# Patient Record
Sex: Female | Born: 1990 | Race: Black or African American | Hispanic: No | Marital: Single | Smoking: Never smoker
Health system: Southern US, Community
[De-identification: ages and names within clinical notes are randomized; demographics above are authoritative.]

## PROBLEM LIST (undated history)

## (undated) ENCOUNTER — Inpatient Hospital Stay (HOSPITAL_COMMUNITY): Payer: Self-pay

## (undated) DIAGNOSIS — F419 Anxiety disorder, unspecified: Secondary | ICD-10-CM

## (undated) DIAGNOSIS — K219 Gastro-esophageal reflux disease without esophagitis: Secondary | ICD-10-CM

## (undated) DIAGNOSIS — A549 Gonococcal infection, unspecified: Secondary | ICD-10-CM

## (undated) HISTORY — PX: WISDOM TOOTH EXTRACTION: SHX21

## (undated) HISTORY — PX: NO PAST SURGERIES: SHX2092

---

## 2011-10-26 NOTE — L&D Delivery Note (Signed)
Delivery Note At 5:26 PM a viable female was delivered via Vaginal, Spontaneous Delivery (Presentation: Left Occiput Anterior).  NICU team present for delivery due to thick MSF, but infant cried w/out stimulation before delivery of body. Deep suctioning not needed per Dr. Katrinka Blazing, neonatologist. Infant bulb suctioned by CNM.APGAR: 9, 9; weight Pending.   Placenta status: Intact, Spontaneous.  Cord: 3 vessels with the following complications: Shoulder cord.  Cord pH: NA  Anesthesia: Epidural  Episiotomy: None Lacerations: left Labial Suture Repair: 3.0 vicryl rapide Est. Blood Loss (mL): 450  Mom to AICU.  Baby to nursery-stable. Placenta to: BS Feeding: Bottle Circ: OP Contraception: Undecided  P:C Ratio resulted ~15 minutes before delivery. Severely elevated at 3.34. Cath specimen. Magnesium Sulfate ordered immediately after delivery and started as soon as avail from pharmacy. Will infuse x 24 hours. Labetalol IV PRN ordered for >/= 160 SBP and/or >/=105 DBP after Mag bolus completed.   Dorathy Kinsman 08/22/2012, 6:31 PM

## 2012-01-15 ENCOUNTER — Encounter (HOSPITAL_COMMUNITY): Payer: Self-pay

## 2012-01-15 ENCOUNTER — Emergency Department (HOSPITAL_COMMUNITY)
Admission: EM | Admit: 2012-01-15 | Discharge: 2012-01-15 | Disposition: A | Discharge status: Home / Self Care | Payer: Medicaid Other | Source: Home / Self Care | Attending: Emergency Medicine | Admitting: Emergency Medicine

## 2012-01-15 DIAGNOSIS — Z331 Pregnant state, incidental: Secondary | ICD-10-CM

## 2012-01-15 DIAGNOSIS — R350 Frequency of micturition: Secondary | ICD-10-CM

## 2012-01-15 HISTORY — DX: Anxiety disorder, unspecified: F41.9

## 2012-01-15 HISTORY — DX: Gastro-esophageal reflux disease without esophagitis: K21.9

## 2012-01-15 LAB — POCT URINALYSIS DIP (DEVICE)
Glucose, UA: NEGATIVE mg/dL
Leukocytes, UA: NEGATIVE
Nitrite: NEGATIVE

## 2012-01-15 LAB — POCT PREGNANCY, URINE: Preg Test, Ur: POSITIVE — AB

## 2012-01-15 NOTE — Discharge Instructions (Signed)
As discussed today if you've decided to continue with this pregnancy you should establish prenatal care as soon as possible see referrals below. Today during your interview denied any pelvic pain or any vaginal bleeding if you were to experience any pelvic pain, vaginal bleeding, should [facility] as further evaluation will be needed.     ABCs of Pregnancy A Antepartum care is very important. Be sure you see your doctor and get prenatal care as soon as you think you are pregnant. At this time, you will be tested for infection, genetic abnormalities and potential problems with you and the pregnancy. This is the time to discuss diet, exercise, work, medications, labor, pain medication during labor and the possibility of a cesarean delivery. Ask any questions that may concern you. It is important to see your doctor regularly throughout your pregnancy. Avoid exposure to toxic substances and chemicals - such as cleaning solvents, lead and mercury, some insecticides, and paint. Pregnant women should avoid exposure to paint fumes, and fumes that cause you to feel ill, dizzy or faint. When possible, it is a good idea to have a pre-pregnancy consultation with your caregiver to begin some important recommendations your caregiver suggests such as, taking folic acid, exercising, quitting smoking, avoiding alcoholic beverages, etc. B Breastfeeding is the healthiest choice for both you and your baby. It has many nutritional benefits for the baby and health benefits for the mother. It also creates a very tight and loving bond between the baby and mother. Talk to your doctor, your family and friends, and your employer about how you choose to feed your baby and how they can support you in your decision. Not all birth defects can be prevented, but a woman can take actions that may increase her chance of having a healthy baby. Many birth defects happen very early in pregnancy, sometimes before a  woman even knows she is pregnant. Birth defects or abnormalities of any child in your or the father's family should be discussed with your caregiver. Get a good support bra as your breast size changes. Wear it especially when you exercise and when nursing.  C Celebrate the news of your pregnancy with the your spouse/father and family. Childbirth classes are helpful to take for you and the spouse/father because it helps to understand what happens during the pregnancy, labor and delivery. Cesarean delivery should be discussed with your doctor so you are prepared for that possibility. The pros and cons of circumcision if it is a boy, should be discussed with your pediatrician. Cigarette smoking during pregnancy can result in low birth weight babies. It has been associated with infertility, miscarriages, tubal pregnancies, infant death (mortality) and poor health (morbidity) in childhood. Additionally, cigarette smoking may cause long-term learning disabilities. If you smoke, you should try to quit before getting pregnant and not smoke during the pregnancy. Secondary smoke may also harm a mother and her developing baby. It is a good idea to ask people to stop smoking around you during your pregnancy and after the baby is born. Extra calcium is necessary when you are pregnant and is found in your prenatal vitamin, in dairy products, green leafy vegetables and in calcium supplements. D A healthy diet according to your current weight and height, along with vitamins and mineral supplements should be discussed with your caregiver. Domestic abuse or violence should be made known to your doctor right away to get the situation corrected. Drink more water when you exercise to keep hydrated. Discomfort  of your back and legs usually develops and progresses from the middle of the second trimester through to delivery of the baby. This is because of the enlarging baby and uterus, which may also affect your balance. Do not take  illegal drugs. Illegal drugs can seriously harm the baby and you. Drink extra fluids (water is best) throughout pregnancy to help your body keep up with the increases in your blood volume. Drink at least 6 to 8 glasses of water, fruit juice, or milk each day. A good way to know you are drinking enough fluid is when your urine looks almost like clear water or is very light yellow.  E Eat healthy to get the nutrients you and your unborn baby need. Your meals should include the five basic food groups. Exercise (30 minutes of light to moderate exercise a day) is important and encouraged during pregnancy, if there are no medical problems or problems with the pregnancy. Exercise that causes discomfort or dizziness should be stopped and reported to your caregiver. Emotions during pregnancy can change from being ecstatic to depression and should be understood by you, your partner and your family. F Fetal screening with ultrasound, amniocentesis and monitoring during pregnancy and labor is common and sometimes necessary. Take 400 micrograms of folic acid daily both before, when possible, and during the first few months of pregnancy to reduce the risk of birth defects of the brain and spine. All women who could possibly become pregnant should take a vitamin with folic acid, every day. It is also important to eat a healthy diet with fortified foods (enriched grain products, including cereals, rice, breads, and pastas) and foods with natural sources of folate (orange juice, green leafy vegetables, beans, peanuts, broccoli, asparagus, peas, and lentils). The father should be involved with all aspects of the pregnancy including, the prenatal care, childbirth classes, labor, delivery, and postpartum time. Fathers may also have emotional concerns about being a father, financial needs, and raising a family. G Genetic testing should be done appropriately. It is important to know your family and the father's history. If there  have been problems with pregnancies or birth defects in your family, report these to your doctor. Also, genetic counselors can talk with you about the information you might need in making decisions about having a family. You can call a major medical center in your area for help in finding a board-certified genetic counselor. Genetic testing and counseling should be done before pregnancy when possible, especially if there is a history of problems in the mother's or father's family. Certain ethnic backgrounds are more at risk for genetic defects. H Get familiar with the hospital where you will be having your baby. Get to know how long it takes to get there, the labor and delivery area, and the hospital procedures. Be sure your medical insurance is accepted there. Get your home ready for the baby including, clothes, the baby's room (when possible), furniture and car seat. Hand washing is important throughout the day, especially after handling raw meat and poultry, changing the baby's diaper or using the bathroom. This can help prevent the spread of many bacteria and viruses that cause infection. Your hair may become dry and thinner, but will return to normal a few weeks after the baby is born. Heartburn is a common problem that can be treated by taking antacids recommended by your caregiver, eating smaller meals 5 or 6 times a day, not drinking liquids when eating, drinking between meals and raising the head  of your bed 2 to 3 inches. I Insurance to cover you, the baby, doctor and hospital should be reviewed so that you will be prepared to pay any costs not covered by your insurance plan. If you do not have medical insurance, there are usually clinics and services available for you in your community. Take 30 milligrams of iron during your pregnancy as prescribed by your doctor to reduce the risk of low red blood cells (anemia) later in pregnancy. All women of childbearing age should eat a diet rich in  iron. J There should be a joint effort for the mother, father and any other children to adapt to the pregnancy financially, emotionally, and psychologically during the pregnancy. Join a support group for moms-to-be. Or, join a class on parenting or childbirth. Have the family participate when possible. K Know your limits. Let your caregiver know if you experience any of the following:   Pain of any kind.   Strong cramps.   You develop a lot of weight in a short period of time (5 pounds in 3 to 5 days).   Vaginal bleeding, leaking of amniotic fluid.   Headache, vision problems.   Dizziness, fainting, shortness of breath.   Chest pain.   Fever of 102 F (38.9 C) or higher.   Gush of clear fluid from your vagina.   Painful urination.   Domestic violence.   Irregular heartbeat (palpitations).   Rapid beating of the heart (tachycardia).   Constant feeling sick to your stomach (nauseous) and vomiting.   Trouble walking, fluid retention (edema).   Muscle weakness.   If your baby has decreased activity.   Persistent diarrhea.   Abnormal vaginal discharge.   Uterine contractions at 20-minute intervals.   Back pain that travels down your leg.  L Learn and practice that what you eat and drink should be in moderation and healthy for you and your baby. Legal drugs such as alcohol and caffeine are important issues for pregnant women. There is no safe amount of alcohol a woman can drink while pregnant. Fetal alcohol syndrome, a disorder characterized by growth retardation, facial abnormalities, and central nervous system dysfunction, is caused by a woman's use of alcohol during pregnancy. Caffeine, found in tea, coffee, soft drinks and chocolate, should also be limited. Be sure to read labels when trying to cut down on caffeine during pregnancy. More than 200 foods, beverages, and over-the-counter medications contain caffeine and have a high salt content! There are coffees and teas  that do not contain caffeine. M Medical conditions such as diabetes, epilepsy, and high blood pressure should be treated and kept under control before pregnancy when possible, but especially during pregnancy. Ask your caregiver about any medications that may need to be changed or adjusted during pregnancy. If you are currently taking any medications, ask your caregiver if it is safe to take them while you are pregnant or before getting pregnant when possible. Also, be sure to discuss any herbs or vitamins you are taking. They are medicines, too! Discuss with your doctor all medications, prescribed and over-the-counter, that you are taking. During your prenatal visit, discuss the medications your doctor may give you during labor and delivery. N Never be afraid to ask your doctor or caregiver questions about your health, the progress of the pregnancy, family problems, stressful situations, and recommendation for a pediatrician, if you do not have one. It is better to take all precautions and discuss any questions or concerns you may have during your  office visits. It is a good idea to write down your questions before you visit the doctor. O Over-the-counter cough and cold remedies may contain alcohol or other ingredients that should be avoided during pregnancy. Ask your caregiver about prescription, herbs or over-the-counter medications that you are taking or may consider taking while pregnant.  P Physical activity during pregnancy can benefit both you and your baby by lessening discomfort and fatigue, providing a sense of well-being, and increasing the likelihood of early recovery after delivery. Light to moderate exercise during pregnancy strengthens the belly (abdominal) and back muscles. This helps improve posture. Practicing yoga, walking, swimming, and cycling on a stationary bicycle are usually safe exercises for pregnant women. Avoid scuba diving, exercise at high altitudes (over 3000 feet), skiing,  horseback riding, contact sports, etc. Always check with your doctor before beginning any kind of exercise, especially during pregnancy and especially if you did not exercise before getting pregnant. Q Queasiness, stomach upset and morning sickness are common during pregnancy. Eating a couple of crackers or dry toast before getting out of bed. Foods that you normally love may make you feel sick to your stomach. You may need to substitute other nutritious foods. Eating 5 or 6 small meals a day instead of 3 large ones may make you feel better. Do not drink with your meals, drink between meals. Questions that you have should be written down and asked during your prenatal visits. R Read about and make plans to baby-proof your home. There are important tips for making your home a safer environment for your baby. Review the tips and make your home safer for you and your baby. Read food labels regarding calories, salt and fat content in the food. S Saunas, hot tubs, and steam rooms should be avoided while you are pregnant. Excessive high heat may be harmful during your pregnancy. Your caregiver will screen and examine you for sexually transmitted diseases and genetic disorders during your prenatal visits. Learn the signs of labor. Sexual relations while pregnant is safe unless there is a medical or pregnancy problem and your caregiver advises against it. T Traveling long distances should be avoided especially in the third trimester of your pregnancy. If you do have to travel out of state, be sure to take a copy of your medical records and medical insurance plan with you. You should not travel long distances without seeing your doctor first. Most airlines will not allow you to travel after 36 weeks of pregnancy. Toxoplasmosis is an infection caused by a parasite that can seriously harm an unborn baby. Avoid eating undercooked meat and handling cat litter. Be sure to wear gloves when gardening. Tingling of the hands  and fingers is not unusual and is due to fluid retention. This will go away after the baby is born. U Womb (uterus) size increases during the first trimester. Your kidneys will begin to function more efficiently. This may cause you to feel the need to urinate more often. You may also leak urine when sneezing, coughing or laughing. This is due to the growing uterus pressing against your bladder, which lies directly in front of and slightly under the uterus during the first few months of pregnancy. If you experience burning along with frequency of urination or bloody urine, be sure to tell your doctor. The size of your uterus in the third trimester may cause a problem with your balance. It is advisable to maintain good posture and avoid wearing high heels during this time. An ultrasound  of your baby may be necessary during your pregnancy and is safe for you and your baby. V Vaccinations are an important concern for pregnant women. Get needed vaccines before pregnancy. Center for Disease Control ([employer]) has clear guidelines for the use of vaccines during pregnancy. Review the list, be sure to discuss it with your doctor. Prenatal vitamins are helpful and healthy for you and the baby. Do not take extra vitamins except what is recommended. Taking too much of certain vitamins can cause overdose problems. Continuous vomiting should be reported to your caregiver. Varicose veins may appear especially if there is a family history of varicose veins. They should subside after the delivery of the baby. Support hose helps if there is leg discomfort. W Being overweight or underweight during pregnancy may cause problems. Try to get within 15 pounds of your ideal weight before pregnancy. Remember, pregnancy is not a time to be dieting! Do not stop eating or start skipping meals as your weight increases. Both you and your baby need the calories and nutrition you receive from a healthy diet. Be sure to consult with your  doctor about your diet. There is a formula and diet plan available depending on whether you are overweight or underweight. Your caregiver or nutritionist can help and advise you if necessary. X Avoid X-rays. If you must have dental work or diagnostic tests, tell your dentist or physician that you are pregnant so that extra care can be taken. X-rays should only be taken when the risks of not taking them outweigh the risk of taking them. If needed, only the minimum amount of radiation should be used. When X-rays are necessary, protective lead shields should be used to cover areas of the body that are not being X-rayed. Y Your baby loves you. Breastfeeding your baby creates a loving and very close bond between the two of you. Give your baby a healthy environment to live in while you are pregnant. Infants and children require constant care and guidance. Their health and safety should be carefully watched at all times. After the baby is born, rest or take a nap when the baby is sleeping. Z Get your ZZZs. Be sure to get plenty of rest. Resting on your side as often as possible, especially on your left side is advised. It provides the best circulation to your baby and helps reduce swelling. Try taking a nap for 30 to 45 minutes in the afternoon when possible. After the baby is born rest or take a nap when the baby is sleeping. Try elevating your feet for that amount of time when possible. It helps the circulation in your legs and helps reduce swelling.  Most information courtesy of the CDC. Document Released: 10/11/2005 Document Revised: 09/30/2011 Document Reviewed: 06/25/2009 [facility] Patient Information 2012 [place], [place].

## 2012-01-15 NOTE — ED Provider Notes (Signed)
History     CSN: [id number]  Arrival date & time 01/15/12  1350   First MD Initiated Contact with Patient 01/15/12 1408      Chief Complaint  Patient presents with  . Urinary Frequency    (Consider location/radiation/quality/duration/timing/severity/associated sxs/prior treatment) HPI Comments: Patient presented to urgent care today complaining that during the last week she has noted that she's been urinating frequently. On further questioning patient denies any pain or vaginal bleeding. No nausea no vomiting no diarrheas.  Patient is specifically asked about abdominal pain she describes she has no pain. She also describes her last menstrual period has been in the first days of February usually regular with her periods. Patient denies having had any home test kit as she is sexually active and not using protection.  Patient denies any previous pregnancies, previous STDs.  Patient is a 21 y.o. female presenting with frequency. The history is provided by the patient.  Urinary Frequency This is a new problem. The current episode started more than 1 week ago. The problem occurs constantly. Pertinent negatives include no abdominal pain and no shortness of breath.    Past Medical History  Diagnosis Date  . Acid reflux   . Anxiety     History reviewed. No pertinent past surgical history.  History reviewed. No pertinent family history.  History  Substance Use Topics  . Smoking status: Never Smoker   . Smokeless tobacco: Not on file  . Alcohol Use: No    OB History    Grav Para Term Preterm Abortions TAB SAB Ect Mult Living                  Review of Systems  Constitutional: Positive for appetite change and fatigue. Negative for fever, diaphoresis and activity change.  Respiratory: Negative for shortness of breath.   Gastrointestinal: Negative for nausea, vomiting, abdominal pain, abdominal distention, anal bleeding and rectal pain.  Genitourinary: Positive for frequency.  Negative for dysuria, flank pain, vaginal bleeding, vaginal discharge, difficulty urinating, vaginal pain, menstrual problem and dyspareunia.       Amenorrhea    Allergies  Review of patient's allergies indicates no known allergies.  Home Medications   Current Outpatient Rx  Name Route Sig Dispense Refill  . OMEPRAZOLE 10 MG PO CPDR Oral Take 10 mg by mouth daily.      BP 101/62  Pulse 60  Temp(Src) 98.9 F (37.2 C) (Oral)  Resp 14  SpO2 100%  LMP 11/26/2011  Physical Exam  Constitutional: She appears well-developed and well-nourished. No distress.  HENT:  Head: Normocephalic.  Eyes: Conjunctivae are normal.  Abdominal: Soft. She exhibits no distension and no mass. There is no tenderness. There is no rebound and no guarding.       As noted on exam given her initial presenting symptom as depicted in nursing notes patient described having pelvic pain 9/10. During my interview and exam patient denies any pain. N. no pelvic discomfort was elicited on deep palpation    ED Course  Procedures (including critical care time)  Labs Reviewed  POCT URINALYSIS DIP (DEVICE) - Abnormal; Notable for the following:    Bilirubin Urine SMALL (*)    Ketones, ur 80 (*)    Protein, ur 30 (*)    All other components within normal limits  POCT PREGNANCY, URINE - Abnormal; Notable for the following:    Preg Test, Ur POSITIVE (*)    All other components within normal limits   No results found.  1. Pregnancy as incidental finding   2. Frequent urination       MDM          Jimmie Molly, MD 01/15/12 [phone]

## 2012-01-15 NOTE — ED Notes (Signed)
Patient states that has frequency when urinating since a week ago. Patient states that has has pain in lower part of abdomen. No pain at this time. Patient states that her pain level at the start of her symptoms was a 9 on scale of (0-10).

## 2012-02-23 ENCOUNTER — Other Ambulatory Visit (HOSPITAL_COMMUNITY): Payer: Self-pay | Admitting: Physician Assistant

## 2012-02-23 DIAGNOSIS — Z3682 Encounter for antenatal screening for nuchal translucency: Secondary | ICD-10-CM

## 2012-02-23 LAB — OB RESULTS CONSOLE RPR: RPR: NONREACTIVE

## 2012-02-23 LAB — OB RESULTS CONSOLE ABO/RH: RH Type: POSITIVE

## 2012-02-23 LAB — OB RESULTS CONSOLE HEPATITIS B SURFACE ANTIGEN: Hepatitis B Surface Ag: NEGATIVE

## 2012-02-23 LAB — OB RESULTS CONSOLE ANTIBODY SCREEN: Antibody Screen: NEGATIVE

## 2012-02-23 LAB — OB RESULTS CONSOLE RUBELLA ANTIBODY, IGM: Rubella: IMMUNE

## 2012-02-24 ENCOUNTER — Ambulatory Visit (HOSPITAL_COMMUNITY)
Admission: RE | Admit: 2012-02-24 | Discharge: 2012-02-24 | Disposition: A | Discharge status: Home / Self Care | Payer: Medicaid Other | Source: Ambulatory Visit | Attending: Physician Assistant | Admitting: Physician Assistant

## 2012-02-24 ENCOUNTER — Other Ambulatory Visit: Payer: Self-pay

## 2012-02-24 ENCOUNTER — Encounter (HOSPITAL_COMMUNITY): Payer: Self-pay

## 2012-02-24 DIAGNOSIS — Z3682 Encounter for antenatal screening for nuchal translucency: Secondary | ICD-10-CM

## 2012-02-24 DIAGNOSIS — O3510X0 Maternal care for (suspected) chromosomal abnormality in fetus, unspecified, not applicable or unspecified: Secondary | ICD-10-CM | POA: Insufficient documentation

## 2012-02-24 DIAGNOSIS — O351XX0 Maternal care for (suspected) chromosomal abnormality in fetus, not applicable or unspecified: Secondary | ICD-10-CM | POA: Insufficient documentation

## 2012-02-24 DIAGNOSIS — Z3689 Encounter for other specified antenatal screening: Secondary | ICD-10-CM | POA: Insufficient documentation

## 2012-03-02 ENCOUNTER — Telehealth (HOSPITAL_COMMUNITY): Payer: Self-pay | Admitting: MS"

## 2012-03-02 NOTE — Telephone Encounter (Addendum)
Called [name] to discuss results of first trimester screen. Reviewed that these results indicate an increased risk for Down syndrome in the pregnancy (1:102). Discussed that this is not diagnostic for this condition. Discussed that there are additional testing and screening options available in the pregnancy to assess for Down syndrome. Offered patient genetic counseling to review results and testing options in more detail. Patient expressed interest and elected to return to our office tomorrow at 9:00 for genetic counseling.   Quinn Plowman, MS Certified Genetic Counselor 2:46 PM    Left message for patient to return call to discuss results.   Quinn Plowman, MS Certified Genetic Counselor 03/02/2012  2:15 PM

## 2012-03-03 ENCOUNTER — Ambulatory Visit (HOSPITAL_COMMUNITY)
Admission: RE | Admit: 2012-03-03 | Discharge: 2012-03-03 | Disposition: A | Discharge status: Home / Self Care | Payer: Medicaid Other | Source: Ambulatory Visit | Attending: Physician Assistant | Admitting: Physician Assistant

## 2012-03-03 ENCOUNTER — Other Ambulatory Visit: Payer: Self-pay

## 2012-03-03 NOTE — Progress Notes (Signed)
Genetic Counseling  High-Risk Gestation Note  Appointment Date:  03/03/2012 Referred By: Quentin Mulling, PA  Date of Birth:  1991-07-11     Pregnancy History: G1P0000 Estimated Date of Delivery: 08/26/12 Estimated Gestational Age: [redacted]w[redacted]d Attending: Alpha Gula, MD    [name] was seen for genetic counseling because of an increased risk for fetal Down syndrome based on First trimester screening. The patient was accompanied by her mother, [name] [name].   They were counseled regarding the First trimester screen result and the associated 1 in 102 risk for fetal Down syndrome.  We reviewed chromosomes, nondisjunction, and the common features and variable prognosis of Down syndrome.  In addition, we reviewed the screen adjusted reduction in risks for trisomies 18/13 (< 1 in 10,000).  We also discussed other explanations for a screen positive result including: differences in maternal metabolism, and normal variation.  We reviewed other available screening and diagnostic options including detailed ultrasound and amniocentesis.  We discussed the risks, limitations, and benefits of each. We discussed another type of screening test, noninvasive prenatal testing (NIPT), which utilizes cell free fetal DNA found in the maternal circulation. This test is not diagnostic for chromosome conditions, but can provide information regarding the presence or absence of extra fetal DNA for chromosomes 13, 18 and 21. Thus, it would not identify or rule out all fetal aneuploidy. The reported detection rate is greater than 99% for Trisomy 21, greater than 98% for Trisomy 18, and is approximately 80% (8 out of 10) for Trisomy 13. The false positive rate is reported to be less than 1% for any of these conditions. After consideration of all the options, and a clear understanding of the newness and limitations of NIPT, [name] elected to proceed with cell free fetal DNA testing ([facility]) at the time of today's visit and  declined amniocentesis. Those results will be available in 8-10 days and will be forwarded to her OB office when we receive them.   She understands that ultrasound and screening cannot rule out all birth defects or genetic syndromes.  The patient was advised of this limitation and states she still does not want diagnostic testing at this time.   However, they were counseled that 50-80% of fetuses with Down syndrome, when well visualized, have detectable anomalies or soft markers by ultrasound.    [name] was provided with written information regarding sickle cell anemia (SCA) including the carrier frequency and incidence in the African-American population, the availability of carrier testing and prenatal diagnosis if indicated.  In addition, we discussed that hemoglobinopathies are routinely screened for as part of the [facility] newborn screening panel. Hemoglobin electrophoresis was performed at her OB office and was reported to be normal.    Both family histories were reviewed and found to be contributory for two maternal half-sisters of the patient (full siblings to each other) born with a hole in the heart. No surgical correction was required. One of these sister's was born with a closed anterior fontanelle. She is otherwise healthy. It is not known if these two features are due to separate causes or a single underlying etiology.  Additionally, the patient has a maternal uncle, currently in his 56's who also has a hole in the heart that has not required surgical correction.   Congenital heart defects occur in approximately 1% of pregnancies.  Congenital heart defects may occur due to multifactorial influences, chromosomal abnormalities, genetic syndromes or environmental exposures.  Isolated heart defects are generally multifactorial. In  the case of a an isolated heart defect for a third degree relative to the current pregnancy, recurrence risk would not be expected to be increased above the general  population risk, assuming multifactorial inheritance. However, it is possible that additional hereditary factors may be present given multiple affected relatives. It is also not clear given the available information whether or not the half-sister with a hole in the heart and a closed fontanelle at birth may have a single underlying cause for these features. We discussed that detailed ultrasound is available in the second trimester to assess fetal growth and development in detail. The patient understands that ultrasound cannot identify or rule out all birth defects prenatally.   Additionally, the patient's brother has developmental delay possibly attributed to a traumatic head injury. We discussed that there are many different causes of mental retardation and developmental delay such as genetic differences, sporadic causes, or injuries.  A specific diagnosis for mental retardation can be determined in approximately 50% of cases.  In the remaining 50% of cases, a diagnosis may not ever be determined. In the case of an environmental cause, such as head trauma, recurrence risk would not likely be increased for relatives. Recurrence risk assessment for relatives may change in the case of a different underlying cause. Without more specific information, potential genetic risks cannot be determined.   [name] was not familiar with the father of the baby's family history.  We, therefore, cannot comment on how his history might contribute to the overall chance for the baby to have a birth defect.  Without further information regarding the provided family history, an accurate genetic risk cannot be calculated. Further genetic counseling is warranted if more information is obtained.  [name] denied exposure to environmental toxins or chemical agents. She denied the use of alcohol, tobacco or street drugs. She denied significant viral illnesses during the course of her pregnancy. Her medical and surgical  histories were noncontributory.   I counseled [name] for approximately 40 minutes regarding the above risks and available options.     Quinn Plowman, MS,  Certified Genetic Counselor 03/03/2012

## 2012-03-15 ENCOUNTER — Telehealth (HOSPITAL_COMMUNITY): Payer: Self-pay | Admitting: MS"

## 2012-03-15 NOTE — Telephone Encounter (Signed)
Called [name] [name] to discuss her [facility], cell free fetal DNA testing.  We reviewed that these are within normal limits, showing a less than 1 in 10,000 risk for trisomies 21, 18 and 13.  We reviewed that this testing identifies > 99% of pregnancies with trisomy 21, >97% of pregnancies with trisomy 68, and >80% with trisomy 73; the false positive rate is <0.1% for all conditions.  She understands that this testing does not identify all genetic conditions.  All questions were answered to her satisfaction, she was encouraged to call with additional questions or concerns.  Quinn Plowman, MS Patent attorney

## 2012-03-23 ENCOUNTER — Other Ambulatory Visit (HOSPITAL_COMMUNITY): Payer: Self-pay | Admitting: Physician Assistant

## 2012-03-23 DIAGNOSIS — Z3689 Encounter for other specified antenatal screening: Secondary | ICD-10-CM

## 2012-03-29 ENCOUNTER — Ambulatory Visit (HOSPITAL_COMMUNITY)
Admission: RE | Admit: 2012-03-29 | Discharge: 2012-03-29 | Disposition: A | Discharge status: Home / Self Care | Payer: Medicaid Other | Source: Ambulatory Visit | Attending: Physician Assistant | Admitting: Physician Assistant

## 2012-03-29 DIAGNOSIS — Z1389 Encounter for screening for other disorder: Secondary | ICD-10-CM | POA: Insufficient documentation

## 2012-03-29 DIAGNOSIS — Z3689 Encounter for other specified antenatal screening: Secondary | ICD-10-CM

## 2012-03-29 DIAGNOSIS — O358XX0 Maternal care for other (suspected) fetal abnormality and damage, not applicable or unspecified: Secondary | ICD-10-CM | POA: Insufficient documentation

## 2012-03-29 DIAGNOSIS — Z363 Encounter for antenatal screening for malformations: Secondary | ICD-10-CM | POA: Insufficient documentation

## 2012-06-08 ENCOUNTER — Other Ambulatory Visit (HOSPITAL_COMMUNITY): Payer: Self-pay | Admitting: Family

## 2012-06-08 DIAGNOSIS — O365990 Maternal care for other known or suspected poor fetal growth, unspecified trimester, not applicable or unspecified: Secondary | ICD-10-CM

## 2012-06-13 ENCOUNTER — Ambulatory Visit (HOSPITAL_COMMUNITY)
Admission: RE | Admit: 2012-06-13 | Discharge: 2012-06-13 | Disposition: A | Discharge status: Home / Self Care | Payer: Medicaid Other | Source: Ambulatory Visit | Attending: Family | Admitting: Family

## 2012-06-13 DIAGNOSIS — O365990 Maternal care for other known or suspected poor fetal growth, unspecified trimester, not applicable or unspecified: Secondary | ICD-10-CM | POA: Insufficient documentation

## 2012-07-10 ENCOUNTER — Encounter (HOSPITAL_COMMUNITY): Payer: Self-pay

## 2012-07-10 ENCOUNTER — Inpatient Hospital Stay (HOSPITAL_COMMUNITY)
Admission: AD | Admit: 2012-07-10 | Discharge: 2012-07-10 | Disposition: A | Discharge status: Home / Self Care | Payer: Medicaid Other | Source: Ambulatory Visit | Attending: Family Medicine | Admitting: Family Medicine

## 2012-07-10 DIAGNOSIS — R109 Unspecified abdominal pain: Secondary | ICD-10-CM | POA: Insufficient documentation

## 2012-07-10 DIAGNOSIS — N949 Unspecified condition associated with female genital organs and menstrual cycle: Secondary | ICD-10-CM

## 2012-07-10 DIAGNOSIS — O99891 Other specified diseases and conditions complicating pregnancy: Secondary | ICD-10-CM | POA: Insufficient documentation

## 2012-07-10 LAB — URINALYSIS, ROUTINE W REFLEX MICROSCOPIC
Leukocytes, UA: NEGATIVE
Protein, ur: NEGATIVE mg/dL
Urobilinogen, UA: 0.2 mg/dL (ref 0.0–1.0)

## 2012-07-10 LAB — WET PREP, GENITAL: Yeast Wet Prep HPF POC: NONE SEEN

## 2012-07-10 MED ORDER — ACETAMINOPHEN 325 MG PO TABS
650.0000 mg | ORAL_TABLET | Freq: Once | ORAL | Status: AC
  Administered 2012-07-10: 650 mg via ORAL
  Filled 2012-07-10: qty 2

## 2012-07-10 NOTE — MAU Note (Signed)
Patient arrived by EMS. States she needed to go the the bathroom and had sharp pains in vagina and rectum. Reports no bleeding or leaking and states good fetal movement.

## 2012-07-10 NOTE — MAU Provider Note (Signed)
Chart reviewed and agree with management and plan.  

## 2012-07-10 NOTE — MAU Provider Note (Signed)
History     CSN: [id number]  Arrival date and time: 07/10/12 1808   None     Chief Complaint  Patient presents with  . Abdominal Pain   HPITakia [name] is 21 y.o. G1P0000 [redacted]w[redacted]d weeks presenting with lower abdominal pain that she described as sharp today at 5pm.  States it felt like something was coming out.  Called EMS.  States the pain got much better but now only a little when she sits down. Hurts with walking.  Good appetite, denies fever and chills.  Denies vaginal bleeding or loss of fluid.  + fetal movement.  Last BM 2 days ago.   States she is urinating more than usual.  Patient of the [facility].   Last seen 9/12.  Tx end of August for yeast.  Not sure it resolved.    Past Medical History  Diagnosis Date  . Acid reflux   . Anxiety     hx of anxiety attack    Past Surgical History  Procedure Date  . No past surgeries     Family History  Problem Relation Age of Onset  . Other Neg Hx     History  Substance Use Topics  . Smoking status: Never Smoker   . Smokeless tobacco: Not on file  . Alcohol Use: No    Allergies: No Known Allergies  Prescriptions prior to admission  Medication Sig Dispense Refill  . Prenatal Vit-Fe Fumarate-FA (PRENATAL MULTIVITAMIN) TABS Take 1 tablet by mouth daily.      Marland Kitchen omeprazole (PRILOSEC) 10 MG capsule Take 10 mg by mouth daily.        Review of Systems  Constitutional: Negative for fever and chills.  Respiratory: Negative.   Cardiovascular: Negative.   Gastrointestinal: Positive for abdominal pain (lower side pains.) and constipation. Negative for nausea, vomiting and diarrhea.  Genitourinary:       Neg for vaginal bleeding, discharge or loss of fluid   Physical Exam   Blood pressure 118/62, pulse 86, temperature 98.5 F (36.9 C), temperature source Oral, resp. rate 16, height 5' 4.5" (1.638 m), weight 70.398 kg (155 lb 3.2 oz), last menstrual period 11/26/2011, SpO2 100.00%.  Physical Exam  Constitutional: She is oriented to  person, place, and time. She appears well-developed and well-nourished. No distress.  Cardiovascular: Normal rate.   Respiratory: Effort normal.  GI: Soft. There is tenderness (mild --area of round ligaments).  Genitourinary: There is tenderness on the right labia. There is tenderness on the left labia. Uterus is enlarged. Uterus is not tender. There is tenderness around the vagina. No bleeding around the vagina. No vaginal discharge found.       Cervical exam by Corrie Dandy, RN==posterior, closed  Neurological: She is alert and oriented to person, place, and time.  Skin: Skin is warm and dry.  Psychiatric:       Patient very uncomfortable with exam.  Difficult for her to open her legs for exam she seemed timid.  Stated there was pain before speculum insertion.    Results for orders placed during the hospital encounter of 07/10/12 (from the past 24 hour(s))  URINALYSIS, ROUTINE W REFLEX MICROSCOPIC     Status: Normal   Collection Time   07/10/12  6:32 PM      Component Value Range   Color, Urine YELLOW  YELLOW   APPearance CLEAR  CLEAR   Specific Gravity, Urine 1.015  1.005 - 1.030   pH 7.0  5.0 - 8.0   Glucose, UA NEGATIVE  NEGATIVE mg/dL   Hgb urine dipstick NEGATIVE  NEGATIVE   Bilirubin Urine NEGATIVE  NEGATIVE   Ketones, ur NEGATIVE  NEGATIVE mg/dL   Protein, ur NEGATIVE  NEGATIVE mg/dL   Urobilinogen, UA 0.2  0.0 - 1.0 mg/dL   Nitrite NEGATIVE  NEGATIVE   Leukocytes, UA NEGATIVE  NEGATIVE   FMS baseline 145, mod variability.  Reactive.  A few contractions that spaced out, occ at end of strip.   MAU Course  Procedures  MDM Tylenol 650mg  po given for discomfort.   Assessment and Plan  A:  Discomfort consistent with Round ligament pain      [redacted]w[redacted]d gestation  P:  Tylenol prn for discomfort      Return for worsening sxs      Keep scheduled appointment     Important to stay well hydrated.  KEY,EVE M 07/10/2012, 7:46 PM

## 2012-08-07 ENCOUNTER — Inpatient Hospital Stay (HOSPITAL_COMMUNITY)
Admission: AD | Admit: 2012-08-07 | Discharge: 2012-08-07 | Disposition: A | Discharge status: Home / Self Care | Payer: Medicaid Other | Source: Ambulatory Visit | Attending: Obstetrics and Gynecology | Admitting: Obstetrics and Gynecology

## 2012-08-07 DIAGNOSIS — O4290 Premature rupture of membranes, unspecified as to length of time between rupture and onset of labor, unspecified weeks of gestation: Secondary | ICD-10-CM

## 2012-08-07 DIAGNOSIS — O99891 Other specified diseases and conditions complicating pregnancy: Secondary | ICD-10-CM | POA: Insufficient documentation

## 2012-08-07 DIAGNOSIS — M549 Dorsalgia, unspecified: Secondary | ICD-10-CM | POA: Insufficient documentation

## 2012-08-07 DIAGNOSIS — R109 Unspecified abdominal pain: Secondary | ICD-10-CM | POA: Insufficient documentation

## 2012-08-07 LAB — WET PREP, GENITAL: Yeast Wet Prep HPF POC: NONE SEEN

## 2012-08-07 LAB — AMNISURE RUPTURE OF MEMBRANE (ROM) NOT AT ARMC: Amnisure ROM: NEGATIVE

## 2012-08-07 MED ORDER — VALACYCLOVIR HCL 500 MG PO TABS
1000.0000 mg | ORAL_TABLET | Freq: Once | ORAL | Status: AC
  Administered 2012-08-07: 1000 mg via ORAL
  Filled 2012-08-07: qty 2

## 2012-08-07 MED ORDER — VALACYCLOVIR HCL 1 G PO TABS: 1000.0000 mg | ORAL_TABLET | Freq: Two times a day (BID) | ORAL | Status: AC

## 2012-08-07 NOTE — MAU Note (Signed)
Pt presents to MAU with chief complaint of leakage of fluid. Pt says she noticed some leaking around 11:00 and called 911

## 2012-08-07 NOTE — MAU Provider Note (Signed)
History     CSN: [id number]  Arrival date and time: 08/07/12 1257   None     Chief Complaint  Patient presents with  . Labor Eval  . Rupture of Membranes   HPI [name] [name] 21 y.o. [redacted]w[redacted]d G1P0000 Patient reports she was sitting down and her water broke with a gush of fluid. She denies vaginal bleeding. She has felt tightening in her stomach since yesterday with some back pain. She felt nauseated yesterday, as well. She has felt her baby move. She denies problems with this pregnancy and is on zantac for GERD. She denies any history of STI. Reports her last sexual encounter to be a month ago. Reports she feels safe at home.  Past Medical History  Diagnosis Date  . Acid reflux   . Anxiety     hx of anxiety attack    Past Surgical History  Procedure Date  . No past surgeries     Family History  Problem Relation Age of Onset  . Other Neg Hx     History  Substance Use Topics  . Smoking status: Never Smoker   . Smokeless tobacco: Not on file  . Alcohol Use: No    Allergies: No Known Allergies  Prescriptions prior to admission  Medication Sig Dispense Refill  . Prenatal Vit-Fe Fumarate-FA (PRENATAL MULTIVITAMIN) TABS Take 1 tablet by mouth daily.        Review of Systems  Constitutional: Negative.   HENT: Negative.   Eyes: Negative.   Respiratory: Negative.   Cardiovascular: Negative.   Gastrointestinal: Positive for nausea.       Cramping  Genitourinary: Negative for dysuria, urgency and frequency.       Vaginal irritation since yesterday  Musculoskeletal: Negative.   Skin: Negative.    Physical Exam   Blood pressure 130/75, pulse 77, temperature 98.1 F (36.7 C), temperature source Oral, resp. rate 18, last menstrual period 11/26/2011.  Physical Exam  Constitutional: She is oriented to person, place, and time. She appears well-developed and well-nourished.  Eyes: EOM are normal. Pupils are equal, round, and reactive to light. Right eye exhibits no  discharge. Left eye exhibits no discharge. No scleral icterus.  Neck: Neck supple.  Cardiovascular: Normal rate, regular rhythm and normal heart sounds.  Exam reveals no gallop and no friction rub.   No murmur heard. Respiratory: Effort normal and breath sounds normal. No respiratory distress. She has no wheezes. She has no rales.  GI: Soft. Bowel sounds are normal. There is no tenderness.       Gravid  Genitourinary: Vaginal discharge found.       GU Exam: Multiple bumps on external genitalia. Three lesions on left labia majora mucosal surface. Area of which appears to be an abrasion or excoriation at the same site as the three lesions. Patient exhibits extreme tenderness in these areas.  Speculum exam: normal vaginal mucosa. Moderate amount of watery discharge.  Bimanual: FT/thick/extrememly posterior  Musculoskeletal: She exhibits no edema and no tenderness.  Neurological: She is alert and oriented to person, place, and time.  Skin: Skin is warm and dry.  Psychiatric:       Extremely quiet. Flat affect.     MAU Course  Procedures 1. EFM 2. Amnisure 3. Wet prep 4. HSV culture and HSV 2 IGG  5. G&C PCR 6. Spec/bimanual  Assessment and Plan  1. Fern: Negative 2. Amnisure: Negative 3. WUJ:WJXB: Negative 4. HSV culture pending 5. HSV 2 IGG, HSV 1&2 IGM pending (serum)  6. G&C pending 7. prophylactic valtrex 1000mg  BID x10 days 8. [facility] the [facility] to receive lab results and instructions on medication  Felix Pacini 08/07/2012, 1:38 PM   Patient seen and discussed with Resident. Agree with note.  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

## 2012-08-08 LAB — HSV 2 ANTIBODY, IGG: HSV 2 Glycoprotein G Ab, IgG: 0.15 IV

## 2012-08-08 NOTE — MAU Provider Note (Signed)
Attestation of Attending Supervision of Advanced Practitioner (CNM/NP): Evaluation and management procedures were performed by the Advanced Practitioner under my supervision and collaboration.  I have reviewed the Advanced Practitioner's note and chart, and I agree with the management and plan.  Malak Orantes 08/08/2012 5:29 AM   

## 2012-08-09 LAB — HERPES SIMPLEX VIRUS CULTURE
Culture: NOT DETECTED
Special Requests: NORMAL

## 2012-08-22 ENCOUNTER — Encounter (HOSPITAL_COMMUNITY): Payer: Self-pay | Admitting: Anesthesiology

## 2012-08-22 ENCOUNTER — Inpatient Hospital Stay (HOSPITAL_COMMUNITY): Payer: Medicaid Other | Admitting: Anesthesiology

## 2012-08-22 ENCOUNTER — Encounter (HOSPITAL_COMMUNITY): Payer: Self-pay | Admitting: Family

## 2012-08-22 ENCOUNTER — Inpatient Hospital Stay (HOSPITAL_COMMUNITY)
Admission: AD | Admit: 2012-08-22 | Discharge: 2012-08-24 | DRG: 774 | Disposition: A | Discharge status: Home / Self Care | Payer: Medicaid Other | Source: Ambulatory Visit | Attending: Obstetrics & Gynecology | Admitting: Obstetrics & Gynecology

## 2012-08-22 DIAGNOSIS — O1414 Severe pre-eclampsia complicating childbirth: Secondary | ICD-10-CM | POA: Diagnosis present

## 2012-08-22 DIAGNOSIS — IMO0002 Reserved for concepts with insufficient information to code with codable children: Secondary | ICD-10-CM

## 2012-08-22 DIAGNOSIS — IMO0001 Reserved for inherently not codable concepts without codable children: Secondary | ICD-10-CM

## 2012-08-22 LAB — ABO/RH: ABO/RH(D): A POS

## 2012-08-22 LAB — POCT FERN TEST

## 2012-08-22 LAB — COMPREHENSIVE METABOLIC PANEL
AST: 17 U/L (ref 0–37)
BUN: 11 mg/dL (ref 6–23)
CO2: 23 mEq/L (ref 19–32)
Calcium: 8.7 mg/dL (ref 8.4–10.5)
Chloride: 100 mEq/L (ref 96–112)
Creatinine, Ser: 0.67 mg/dL (ref 0.50–1.10)
GFR calc Af Amer: >90 mL/min (ref >90)
GFR calc non Af Amer: >90 mL/min (ref >90)
Glucose, Bld: 77 mg/dL (ref 70–99)
Total Bilirubin: 0.2 mg/dL — ABNORMAL LOW (ref 0.3–1.2)

## 2012-08-22 LAB — CBC
MCH: 26 pg (ref 26.0–34.0)
MCH: 26.2 pg (ref 26.0–34.0)
MCHC: 31.8 g/dL (ref 30.0–36.0)
MCHC: 31.9 g/dL (ref 30.0–36.0)
MCV: 81.9 fL (ref 78.0–100.0)
Platelets: 276 10*3/uL (ref 150–400)
Platelets: 259 10*3/uL (ref 150–400)
RBC: 3.35 MIL/uL — ABNORMAL LOW (ref 3.87–5.11)
RBC: 3.21 MIL/uL — ABNORMAL LOW (ref 3.87–5.11)
RDW: 15.2 % (ref 11.5–15.5)

## 2012-08-22 LAB — TYPE AND SCREEN: ABO/RH(D): A POS

## 2012-08-22 LAB — PROTEIN / CREATININE RATIO, URINE
Creatinine, Urine: 67.8 mg/dL
Total Protein, Urine: 226.6 mg/dL

## 2012-08-22 LAB — RPR: RPR Ser Ql: NONREACTIVE

## 2012-08-22 LAB — MRSA PCR SCREENING: MRSA by PCR: NEGATIVE

## 2012-08-22 MED ORDER — LACTATED RINGERS IV SOLN: 500.0000 mL | INTRAVENOUS | Status: DC | PRN

## 2012-08-22 MED ORDER — MAGNESIUM HYDROXIDE 400 MG/5ML PO SUSP
30.0000 mL | ORAL | Status: DC | PRN
  Filled 2012-08-22: qty 30

## 2012-08-22 MED ORDER — DIPHENHYDRAMINE HCL 25 MG PO CAPS: 25.0000 mg | ORAL_CAPSULE | Freq: Four times a day (QID) | ORAL | Status: DC | PRN

## 2012-08-22 MED ORDER — LIDOCAINE HCL (PF) 1 % IJ SOLN
30.0000 mL | INTRAMUSCULAR | Status: DC | PRN
  Administered 2012-08-22: 30 mL via SUBCUTANEOUS
  Filled 2012-08-22: qty 30

## 2012-08-22 MED ORDER — ACETAMINOPHEN 325 MG PO TABS: 650.0000 mg | ORAL_TABLET | ORAL | Status: DC | PRN

## 2012-08-22 MED ORDER — FERROUS SULFATE 325 (65 FE) MG PO TABS
325.0000 mg | ORAL_TABLET | Freq: Two times a day (BID) | ORAL | Status: DC
  Administered 2012-08-23 – 2012-08-24 (×3): 325 mg via ORAL
  Filled 2012-08-22 (×3): qty 1

## 2012-08-22 MED ORDER — SENNOSIDES-DOCUSATE SODIUM 8.6-50 MG PO TABS
2.0000 | ORAL_TABLET | Freq: Every day | ORAL | Status: DC
  Administered 2012-08-22 – 2012-08-23 (×2): 2 via ORAL

## 2012-08-22 MED ORDER — OXYCODONE-ACETAMINOPHEN 5-325 MG PO TABS
1.0000 | ORAL_TABLET | ORAL | Status: DC | PRN
  Administered 2012-08-23 – 2012-08-24 (×3): 1 via ORAL
  Filled 2012-08-22 (×3): qty 1

## 2012-08-22 MED ORDER — EPHEDRINE 5 MG/ML INJ: 10.0000 mg | INTRAVENOUS | Status: DC | PRN

## 2012-08-22 MED ORDER — WITCH HAZEL-GLYCERIN EX PADS: 1.0000 "application " | MEDICATED_PAD | CUTANEOUS | Status: DC | PRN

## 2012-08-22 MED ORDER — FENTANYL 2.5 MCG/ML BUPIVACAINE 1/10 % EPIDURAL INFUSION (WH - ANES)
14.0000 mL/h | INTRAMUSCULAR | Status: DC
  Administered 2012-08-22: 14 mL/h via EPIDURAL
  Filled 2012-08-22: qty 125

## 2012-08-22 MED ORDER — MEASLES, MUMPS & RUBELLA VAC ~~LOC~~ INJ
0.5000 mL | INJECTION | Freq: Once | SUBCUTANEOUS | Status: DC
  Filled 2012-08-22: qty 0.5

## 2012-08-22 MED ORDER — BUPIVACAINE HCL (PF) 0.25 % IJ SOLN
INTRAMUSCULAR | Status: DC | PRN
  Administered 2012-08-22: 5 mL via EPIDURAL

## 2012-08-22 MED ORDER — LABETALOL HCL 5 MG/ML IV SOLN: 10.0000 mg | Freq: Once | INTRAVENOUS | Status: AC | PRN

## 2012-08-22 MED ORDER — LANOLIN HYDROUS EX OINT: 1.0000 "application " | TOPICAL_OINTMENT | CUTANEOUS | Status: DC | PRN

## 2012-08-22 MED ORDER — NALBUPHINE SYRINGE 5 MG/0.5 ML: 5.0000 mg | INJECTION | INTRAMUSCULAR | Status: DC | PRN

## 2012-08-22 MED ORDER — OXYTOCIN 40 UNITS IN LACTATED RINGERS INFUSION - SIMPLE MED
62.5000 mL/h | INTRAVENOUS | Status: DC
  Filled 2012-08-22: qty 1000

## 2012-08-22 MED ORDER — BENZOCAINE-MENTHOL 20-0.5 % EX AERO
1.0000 "application " | INHALATION_SPRAY | CUTANEOUS | Status: DC | PRN
  Filled 2012-08-22: qty 56

## 2012-08-22 MED ORDER — HYDROXYZINE HCL 50 MG PO TABS: 50.0000 mg | ORAL_TABLET | Freq: Four times a day (QID) | ORAL | Status: DC | PRN

## 2012-08-22 MED ORDER — ONDANSETRON HCL 4 MG PO TABS: 4.0000 mg | ORAL_TABLET | ORAL | Status: DC | PRN

## 2012-08-22 MED ORDER — IBUPROFEN 600 MG PO TABS
600.0000 mg | ORAL_TABLET | Freq: Four times a day (QID) | ORAL | Status: DC
  Administered 2012-08-22 – 2012-08-24 (×8): 600 mg via ORAL
  Filled 2012-08-22 (×8): qty 1

## 2012-08-22 MED ORDER — LACTATED RINGERS IV SOLN
INTRAVENOUS | Status: DC
  Administered 2012-08-22 (×2): via INTRAVENOUS

## 2012-08-22 MED ORDER — PRENATAL MULTIVITAMIN CH
1.0000 | ORAL_TABLET | Freq: Every day | ORAL | Status: DC
  Administered 2012-08-22 – 2012-08-24 (×3): 1 via ORAL
  Filled 2012-08-22 (×3): qty 1

## 2012-08-22 MED ORDER — HYDROXYZINE HCL 50 MG/ML IM SOLN: 50.0000 mg | Freq: Four times a day (QID) | INTRAMUSCULAR | Status: DC | PRN

## 2012-08-22 MED ORDER — PHENYLEPHRINE 40 MCG/ML (10ML) SYRINGE FOR IV PUSH (FOR BLOOD PRESSURE SUPPORT)
80.0000 ug | PREFILLED_SYRINGE | INTRAVENOUS | Status: DC | PRN
  Filled 2012-08-22: qty 5

## 2012-08-22 MED ORDER — OXYTOCIN BOLUS FROM INFUSION
500.0000 mL | Freq: Once | INTRAVENOUS | Status: AC
  Administered 2012-08-22: 500 mL via INTRAVENOUS
  Filled 2012-08-22: qty 500

## 2012-08-22 MED ORDER — LIDOCAINE HCL (PF) 1 % IJ SOLN
INTRAMUSCULAR | Status: DC | PRN
  Administered 2012-08-22 (×4): 4 mL

## 2012-08-22 MED ORDER — IBUPROFEN 600 MG PO TABS: 600.0000 mg | ORAL_TABLET | Freq: Four times a day (QID) | ORAL | Status: DC | PRN

## 2012-08-22 MED ORDER — DIPHENHYDRAMINE HCL 50 MG/ML IJ SOLN: 12.5000 mg | INTRAMUSCULAR | Status: DC | PRN

## 2012-08-22 MED ORDER — DIBUCAINE 1 % RE OINT: 1.0000 "application " | TOPICAL_OINTMENT | RECTAL | Status: DC | PRN

## 2012-08-22 MED ORDER — MAGNESIUM SULFATE BOLUS VIA INFUSION
4.0000 g | Freq: Once | INTRAVENOUS | Status: AC
  Administered 2012-08-22: 4 g via INTRAVENOUS
  Filled 2012-08-22: qty 500

## 2012-08-22 MED ORDER — ONDANSETRON HCL 4 MG/2ML IJ SOLN: 4.0000 mg | INTRAMUSCULAR | Status: DC | PRN

## 2012-08-22 MED ORDER — LACTATED RINGERS IV SOLN
INTRAVENOUS | Status: DC
  Administered 2012-08-23 (×2): via INTRAVENOUS

## 2012-08-22 MED ORDER — FENTANYL CITRATE 0.05 MG/ML IJ SOLN
100.0000 ug | INTRAMUSCULAR | Status: DC | PRN
  Administered 2012-08-22: 100 ug via INTRAVENOUS
  Filled 2012-08-22: qty 2

## 2012-08-22 MED ORDER — CITRIC ACID-SODIUM CITRATE 334-500 MG/5ML PO SOLN: 30.0000 mL | ORAL | Status: DC | PRN

## 2012-08-22 MED ORDER — ONDANSETRON HCL 4 MG/2ML IJ SOLN: 4.0000 mg | Freq: Four times a day (QID) | INTRAMUSCULAR | Status: DC | PRN

## 2012-08-22 MED ORDER — NALBUPHINE HCL 10 MG/ML IJ SOLN
10.0000 mg | Freq: Once | INTRAMUSCULAR | Status: AC
  Administered 2012-08-22: 10 mg via INTRAMUSCULAR
  Filled 2012-08-22: qty 1

## 2012-08-22 MED ORDER — LACTATED RINGERS IV SOLN
500.0000 mL | Freq: Once | INTRAVENOUS | Status: AC
  Administered 2012-08-22: 500 mL via INTRAVENOUS

## 2012-08-22 MED ORDER — OXYCODONE-ACETAMINOPHEN 5-325 MG PO TABS: 1.0000 | ORAL_TABLET | ORAL | Status: DC | PRN

## 2012-08-22 MED ORDER — MAGNESIUM SULFATE 40 G IN LACTATED RINGERS - SIMPLE
2.0000 g/h | INTRAVENOUS | Status: DC
  Administered 2012-08-22 – 2012-08-23 (×2): 2 g/h via INTRAVENOUS
  Filled 2012-08-22 (×2): qty 500

## 2012-08-22 MED ORDER — MAGNESIUM SULFATE 40 G IN LACTATED RINGERS - SIMPLE
2.0000 g/h | INTRAVENOUS | Status: DC
  Filled 2012-08-22: qty 500

## 2012-08-22 MED ORDER — EPHEDRINE 5 MG/ML INJ
10.0000 mg | INTRAVENOUS | Status: DC | PRN
  Filled 2012-08-22: qty 4

## 2012-08-22 MED ORDER — SIMETHICONE 80 MG PO CHEW: 80.0000 mg | CHEWABLE_TABLET | ORAL | Status: DC | PRN

## 2012-08-22 MED ORDER — ZOLPIDEM TARTRATE 5 MG PO TABS: 5.0000 mg | ORAL_TABLET | Freq: Every evening | ORAL | Status: DC | PRN

## 2012-08-22 MED ORDER — TETANUS-DIPHTH-ACELL PERTUSSIS 5-2.5-18.5 LF-MCG/0.5 IM SUSP
0.5000 mL | Freq: Once | INTRAMUSCULAR | Status: AC
  Administered 2012-08-23: 0.5 mL via INTRAMUSCULAR
  Filled 2012-08-22: qty 0.5

## 2012-08-22 MED ORDER — PHENYLEPHRINE 40 MCG/ML (10ML) SYRINGE FOR IV PUSH (FOR BLOOD PRESSURE SUPPORT): 80.0000 ug | PREFILLED_SYRINGE | INTRAVENOUS | Status: DC | PRN

## 2012-08-22 NOTE — MAU Provider Note (Signed)
  History     CSN: [id number]  Arrival date and time: 08/22/12 0715  Chief Complaint  Patient presents with  . Maybe leaking fluid, passed mucous plug    HPI  Patient states that she woke up this morning and noticed a "slimy" discharge.  Volume was not enough to soak night clothes.  She also reports that contractions began around 4 am, but have increased in intensity since 6 am.  Patient states that they are currently 5 minutes apart.  No blood reported in discharge, and patient denies having had any similar episodes in the past.  She also denies popping sensations associated with discharge.  Past Medical History  Diagnosis Date  . Acid reflux   . Anxiety     hx of anxiety attack    Past Surgical History  Procedure Date  . No past surgeries     Family History  Problem Relation Age of Onset  . Other Neg Hx     History  Substance Use Topics  . Smoking status: Never Smoker   . Smokeless tobacco: Not on file  . Alcohol Use: No    Allergies: No Known Allergies  Prescriptions prior to admission  Medication Sig Dispense Refill  . Prenatal Vit-Fe Fumarate-FA (PRENATAL MULTIVITAMIN) TABS Take 1 tablet by mouth daily.      . ranitidine (ZANTAC) 150 MG tablet Take 150 mg by mouth 2 (two) times daily.        ROS  See HPI for pertinent positives and negatives  Physical Exam   Blood pressure 134/72, pulse 71, temperature 98.6 F (37 C), temperature source Oral, resp. rate 16, height 5\' 5"  (1.651 m), weight 79.379 kg (175 lb), last menstrual period 11/26/2011.  Physical Exam  Alert and oriented, skin warm and dry, no apparent distress  MAU Course  Procedures  MDM Obtain fern slide to assess for ROM  Assessment and Plan   21 yo female presenting with contractions and possible ROM  1)  Monitor NST, fetal movement, and contractions 2)  Assess fern slide  If positive, admit and monitor  If negative, discharge to home  Nira Conn 08/22/2012, 8:51 AM   I was  present for the exam and agree with above.  [place], [place] 08/22/2012 6:26 PM

## 2012-08-22 NOTE — Progress Notes (Signed)
[name] [name] is a 21 y.o. G1P0000 at [redacted]w[redacted]d.  Subjective: Not comfortable w/ epidural and PCA x 3. Pelvic pressure  Objective: BP 147/95  Pulse 93  Temp 97.9 F (36.6 C) (Oral)  Resp 20  Ht 5\' 5"  (1.651 m)  Wt 79.379 kg (175 lb)  BMI 29.12 kg/m2  SpO2 100%  LMP 11/26/2011 Patient Vitals for the past 24 hrs:  BP Temp Temp src Pulse Resp SpO2 Height Weight  08/22/12 1504 133/87 mmHg - - 78  20  - - -  08/22/12 1450 140/80 mmHg - - 84  - - - -  08/22/12 1446 159/103 mmHg - - 90  - - - -  08/22/12 1443 - - - 68  - 100 % - -  08/22/12 1441 158/106 mmHg - - 91  - - - -  08/22/12 1438 - - - 73  - 100 % - -  08/22/12 1436 150/100 mmHg - - 84  - - - -  08/22/12 1433 - - - 73  - 100 % - -  08/22/12 1431 150/105 mmHg - - 86  - - - -  08/22/12 1428 - - - 68  - 100 % - -  08/22/12 1426 147/96 mmHg - - 71  - - - -  08/22/12 1423 - - - 77  - 100 % - -  08/22/12 1421 149/95 mmHg - - 77  - - - -  08/22/12 1418 - - - 75  - 100 % - -  08/22/12 1417 145/96 mmHg 98.4 F (36.9 C) Oral 79  20  - - -  08/22/12 1413 - - - 88  - 100 % - -  08/22/12 1412 160/80 mmHg - - 74  - - - -  08/22/12 1410 148/89 mmHg - - 79  - - - -  08/22/12 1408 - - - 77  - 100 % - -  08/22/12 1403 141/60 mmHg - - 98  - 100 % - -  08/22/12 1400 147/86 mmHg - - 91  - 100 % - -  08/22/12 1355 - - - 57  - 97 % - -  08/22/12 1241 129/84 mmHg 98 F (36.7 C) Oral 83  20  - - -  08/22/12 1150 - - - - 18  - - -  08/22/12 1116 140/90 mmHg - - 76  - - - -  08/22/12 1113 147/92 mmHg - - 79  - - - -  08/22/12 1052 148/94 mmHg 98.3 F (36.8 C) Oral 87  20  - - -  08/22/12 1037 137/83 mmHg - - 83  16  - - -  08/22/12 0731 134/72 mmHg 98.6 F (37 C) Oral 71  16  - 5\' 5"  (1.651 m) 79.379 kg (175 lb)    FHT:  FHR: 135 bpm, variability: moderate,  accelerations:  Present,  decelerations:  Absent UC:   regular, every 1-3 minutes, strong SVE:   Dilation: 6 Effacement (%): 95 Station: -1 Exam by:: Ivonne Andrew, CNM  Labs: Lab  Results  Component Value Date   WBC 8.3 08/22/2012   HGB 8.7* 08/22/2012   HCT 27.4* 08/22/2012   MCV 81.8 08/22/2012   PLT 276 08/22/2012    Assessment / Plan: Spontaneous labor, progressing normally  Labor: Progressing normally Preeclampsia:  BP elevated, labs pending. Urine collected for PC ratio.  Fetal Wellbeing:  Category I Pain Control:  Epidural. Will call anesthesia to assess epidural. I/D:  n/a Anticipated MOD:  NSVD  Dorathy Kinsman 08/22/2012, 4:02 PM

## 2012-08-22 NOTE — H&P (Signed)
HPI: [name] [name] is a 21 y.o. year old G40P0000 female at [redacted]w[redacted]d weeks gestation who presents to MAU reporting leaking small amount of fluid since 0600. She received care at [facility].  She reports moderate contractions and denies VB. Normal FM.   Pregnancy significant for: 1. Elevated BP third trimester. 24 hour urine in process.  2. Elevated DRS in first trimester screen. Normal [facility].   3. Vulvar lesion third trimester. HSV I&II glycoprotein neg.  Maternal Medical History:  Reason for admission: Reason for Admission:   nausea  OB History    Grav Para Term Preterm Abortions TAB SAB Ect Mult Living   1 0 0 0 0 0 0 0 0 0      Past Medical History  Diagnosis Date  . Acid reflux   . Anxiety     hx of anxiety attack   Past Surgical History  Procedure Date  . No past surgeries   . Wisdom tooth extraction    Family History: family history includes Asthma in her brother and sister; Diabetes in her maternal grandmother; and Hypertension in her brother.  There is no history of Other. Social History:  reports that she has never smoked. She does not have any smokeless tobacco history on file. She reports that she does not drink alcohol or use illicit drugs.   Prenatal Transfer Tool  Maternal Diabetes: No Genetic Screening: Abnormal:  Results: Elevated risk of Trisomy 21, Other: [facility] normal Maternal Ultrasounds/Referrals: Normal Fetal Ultrasounds or other Referrals:  None Maternal Substance Abuse:  No Significant Maternal Medications:  None Significant Maternal Lab Results:  None Other Comments:  None  Review of Systems  Constitutional: Negative for fever and chills.  Eyes: Negative for blurred vision and double vision.  Gastrointestinal: Negative for heartburn, nausea and vomiting.       No epigastric pain.  Neurological: Negative for headaches.    Dilation: 7 Effacement (%): 90 Station: 0 Exam by:: Renaldo Harrison, RN Blood pressure 148/87, pulse 87, temperature 97.9 F (36.6 C),  temperature source Oral, resp. rate 20, height 5\' 5"  (1.651 m), weight 79.379 kg (175 lb), last menstrual period 11/26/2011, SpO2 100.00%. Maternal Exam:  Uterine Assessment: Contraction strength is moderate.  Contraction frequency is regular.   Abdomen: Patient reports no abdominal tenderness. Fundal height is S=D.   Fetal presentation: vertex  Introitus: Normal vulva. Vulva is negative for lesion.  Vagina is positive for vaginal discharge (mucus mixed w/ scant brown blood and ?meconium).  Ferning test: positive.  Amniotic fluid character: meconium stained.  Pelvis: adequate for delivery.   Cervix: Cervix evaluated by sterile speculum exam and digital exam.     Fetal Exam Fetal Monitor Review: Mode: ultrasound.   Baseline rate: 130.  Variability: moderate (6-25 bpm).   Pattern: accelerations present and no decelerations.    Fetal State Assessment: Category I - tracings are normal.     Physical Exam  Nursing note and vitals reviewed. Constitutional: She is oriented to person, place, and time. She appears well-developed and well-nourished. She appears distressed.  HENT:  Head: Normocephalic.  Eyes: Pupils are equal, round, and reactive to light.  Neck: Normal range of motion.  Cardiovascular: Normal rate, regular rhythm and normal heart sounds.   Respiratory: Effort normal and breath sounds normal.  GI: Soft.  Genitourinary: Uterus normal. Vulva exhibits no lesion. Vaginal discharge (mucus mixed w/ scant brown blood and ?meconium) found.  Musculoskeletal: Normal range of motion. She exhibits no edema.  Neurological: She is alert and oriented to  person, place, and time. She has normal reflexes.  Skin: Skin is warm and dry.  Psychiatric: She has a normal mood and affect.    Prenatal labs: ABO, Rh: A/Positive/-- (05/01 0000) Antibody: Negative (05/01 0000) Rubella: Immune (05/01 0000) RPR: NON REACTIVE (10/29 1100)  HBsAg: Negative (05/01 0000)  HIV: Non-reactive (05/01  0000)  GBS: Negative (10/14 0000)  1 hour GTT 53 early,. 86 Genetic screening: Increased DSR in first trimester screen, [facility] normal. CF neg  Assessment: 1. Labor: early, SROM, ?MSF 2. Fetal Wellbeing: Category I  3. Pain Control: Nubain 4. GBS: neg 5. 39.3 week IUP  Plan:  1. Admit to BS per consult with MD 2. Routine L&D orders 3. Analgesia/anesthesia PRN   Dorathy Kinsman 08/22/2012, 4:38 PM

## 2012-08-22 NOTE — MAU Note (Signed)
Arrived via EMS. States she passed her mucous plug and gave vague description of possibly leaking fluid. Having some contractions.

## 2012-08-22 NOTE — Anesthesia Preprocedure Evaluation (Signed)
Anesthesia Evaluation  Patient identified by MRN, date of birth, ID band Patient awake    Reviewed: Allergy & Precautions, H&P , NPO status , Patient's Chart, lab work & pertinent test results, reviewed documented beta blocker date and time   History of Anesthesia Complications Negative for: history of anesthetic complications  Airway Mallampati: II TM Distance: >3 FB Neck ROM: full    Dental  (+) Teeth Intact   Pulmonary neg pulmonary ROS,  breath sounds clear to auscultation        Cardiovascular negative cardio ROS  Rhythm:regular Rate:Normal     Neuro/Psych negative neurological ROS  negative psych ROS   GI/Hepatic Neg liver ROS, GERD-  Medicated,  Endo/Other  negative endocrine ROS  Renal/GU negative Renal ROS     Musculoskeletal   Abdominal   Peds  Hematology  (+) Blood dyscrasia (hgb 8.7), anemia ,   Anesthesia Other Findings   Reproductive/Obstetrics (+) Pregnancy                           Anesthesia Physical Anesthesia Plan  ASA: II  Anesthesia Plan: Epidural   Post-op Pain Management:    Induction:   Airway Management Planned:   Additional Equipment:   Intra-op Plan:   Post-operative Plan:   Informed Consent: I have reviewed the patients History and Physical, chart, labs and discussed the procedure including the risks, benefits and alternatives for the proposed anesthesia with the patient or authorized representative who has indicated his/her understanding and acceptance.     Plan Discussed with:   Anesthesia Plan Comments:         Anesthesia Quick Evaluation

## 2012-08-22 NOTE — MAU Note (Signed)
Pt reports passed some "goo" this morning when up to BR; mucous-like/clear. Denies bleeding.

## 2012-08-22 NOTE — Anesthesia Procedure Notes (Signed)
Epidural Patient location during procedure: OB Start time: 08/22/2012 2:03 PM  Staffing Performed by: anesthesiologist   Preanesthetic Checklist Completed: patient identified, site marked, surgical consent, pre-op evaluation, timeout performed, IV checked, risks and benefits discussed and monitors and equipment checked  Epidural Patient position: sitting Prep: site prepped and draped and DuraPrep Patient monitoring: continuous pulse ox and blood pressure Approach: midline Injection technique: LOR air  Needle:  Needle type: Tuohy  Needle gauge: 17 G Needle length: 9 cm and 9 Needle insertion depth: 6 cm Catheter type: closed end flexible Catheter size: 19 Gauge Catheter at skin depth: 11 cm Test dose: negative  Assessment Events: blood not aspirated, injection not painful, no injection resistance, negative IV test and no paresthesia  Additional Notes Discussed risk of headache, infection, bleeding, nerve injury and failed or incomplete block.  Patient voices understanding and wishes to proceed. Reason for block:procedure for pain

## 2012-08-23 LAB — CBC
HCT: 23.9 % — ABNORMAL LOW (ref 36.0–46.0)
MCHC: 31.8 g/dL (ref 30.0–36.0)
RDW: 15.1 % (ref 11.5–15.5)

## 2012-08-23 NOTE — Progress Notes (Addendum)
Post Partum Day 1 Subjective: no complaints, up ad lib, voiding, tolerating PO and + flatus  Objective: Blood pressure 134/90, pulse 73, temperature 97.7 F (36.5 C), temperature source Oral, resp. rate 20, height 5\' 5"  (1.651 m), weight 73.936 kg (163 lb), last menstrual period 11/26/2011, SpO2 98.00%, unknown if currently breastfeeding. Temp:  [97.7 F (36.5 C)-99.6 F (37.6 C)] 98 F (36.7 C) (10/30 0739) Pulse Rate:  [57-131] 73  (10/30 0700) Resp:  [16-20] 20  (10/29 1930) BP: (99-170)/(20-120) 134/90 mmHg (10/30 0700) SpO2:  [97 %-100 %] 98 % (10/30 0700) Weight:  [73.8 kg (162 lb 11.2 oz)-73.936 kg (163 lb)] 73.936 kg (163 lb) (10/30 0500)  Intake/Output Summary (Last 24 hours) at 08/23/12 0748 Last data filed at 08/23/12 0542  Gross per 24 hour  Intake 2929.71 ml  Output   3700 ml  Net -770.29 ml    Physical Exam:  General: alert and cooperative Heart: RRR. Lungs: CTAB Lochia: appropriate Uterine Fundus: firm Incision: none DVT Evaluation: No evidence of DVT seen on physical exam. Negative Homan's sign. No cords or calf tenderness. Neuro/msk: Clonus +; 3 beats   Basename 08/23/12 0525 08/22/12 1804  HGB 7.6* 8.4*  HCT 23.9* 26.3*    Assessment/Plan: Plan for discharge tomorrow and Contraception uncertain Bottle feeding Will discontinue MgSO4 today at the 24 hour mark (1400) BP: stable   LOS: 1 day   Kuneff, Renee 08/23/2012, 7:32 AM   I have seen and examined this patient and I agree with the above. Cam Hai 7:52 AM 08/23/2012

## 2012-08-23 NOTE — Progress Notes (Signed)
UR chart review completed.  

## 2012-08-23 NOTE — Anesthesia Postprocedure Evaluation (Signed)
  Anesthesia Post-op Note  Patient: [name] [name]  Procedure(s) Performed: * No procedures listed *  Patient Location: [facility]  Anesthesia Type:Epidural  Level of Consciousness: awake, alert  and oriented  Airway and Oxygen Therapy: Patient Spontanous Breathing  Post-op Pain: none  Post-op Assessment: Patient's Cardiovascular Status Stable, Respiratory Function Stable, No headache, No backache, No residual numbness and No residual motor weakness  Post-op Vital Signs: Reviewed and stable  Complications: No apparent anesthesia complications

## 2012-08-24 MED ORDER — IBUPROFEN 600 MG PO TABS: 600.0000 mg | ORAL_TABLET | Freq: Four times a day (QID) | ORAL | Status: DC

## 2012-08-24 MED ORDER — INFLUENZA VIRUS VACC SPLIT PF IM SUSP
0.5000 mL | Freq: Once | INTRAMUSCULAR | Status: AC
  Administered 2012-08-24: 0.5 mL via INTRAMUSCULAR
  Filled 2012-08-24: qty 0.5

## 2012-08-24 NOTE — Clinical Social Work Maternal (Signed)
Clinical Social Work Department  PSYCHOSOCIAL ASSESSMENT - MATERNAL/CHILD  08/24/2012  Patient: [name],[name] Account Number: [id number] Admit Date: 08/22/2012  Childs Name:  [name] [name]   Clinical Social Worker: Aarionna Germer, LCSWA Date/Time: 08/24/2012 01:50 PM  Date Referred: 08/24/2012  Referral source   RN    Referred reason   Other - See comment   Other referral source:  I: FAMILY / HOME ENVIRONMENT  Child's legal guardian: PARENT  Guardian - Name  Guardian - Age  Guardian - Address   [name] [name]  21  [address] [place], [place] [address]   [name] [name]  21    Other household support members/support persons  Name  Relationship  DOB   [name] [name]  MOTHER    Other support:  II PSYCHOSOCIAL DATA  Information Source:  Financial and Community Resources  Employment:  Financial resources: Medicaid  If Medicaid - County: [place]  Other   Food Stamps   WIC   School / Grade:  Maternity Care Coordinator / Child Services Coordination / Early Interventions: Cultural issues impacting care:  III STRENGTHS  Strengths   Adequate Resources   Home prepared for Child (including basic supplies)   Supportive family/friends   Strength comment:  IV RISK FACTORS AND CURRENT PROBLEMS  Current Problem: YES  Risk Factor & Current Problem  Patient Issue  Family Issue  Risk Factor / Current Problem Comment   Knowledge/Cognitive Deficit  Y  N    V SOCIAL WORK ASSESSMENT  Sw referral received from RN, after mom didn't not feed or change the infant. According to RN, MOB "seemed unaware of the need and lacked the motivation to ask." Sw met with pt to assess the situation. She lives with her mother and 9 siblings. She is originally from [place], [place] but relocated to the area from [place], [place] with her family in December 2012. She did not graduate, as she reports the last grade completed as 10th. Pt told Sw that she receives SSI for a "learning disability." When asked about her comfort level  in caring for the infant she told this Sw that she was fine. Sw inquired about the incident that occurred yesterday and pt explained that she was never told that the formula was in the drawer. She did not have an explanation for not changing the infant. Pt avoids eye contact and appears to have a flat affect. When asked about any depressed moods, she denied any depression. Pt talks in a very soft/mumbled voice. Sw was able to observe the pt holding the infant appropriately. Sw is concerned about this pt's ability to parent independently and asked pt for permission to contact her mother. Pt agreed and gave this Sw her mothers contact information. Sw spoke with the pt's mother who told Sw that she would be a support person to her daughter. According to pt's mother, the pt has cared for small children in their family appropriately. Pt's mother has a lot of experience caring for children and will be involved. Sw referred pt to the [employer], [facility], as well as [facility]. Pt and her mom are aware of referrals. FOB is not involved. Pt has all the necessary supplies for the infant. Sw available to assist further if needed.   VI SOCIAL WORK PLAN  Social Work Plan   No Further Intervention Required / No Barriers to Discharge   Information/Referral to Community Resources   Type of pt/family education:  If child protective services report -   county:  If child protective services report - date:  Information/referral to community resources comment:  [facility]   [facility], [employer]   Other social work plan:   

## 2012-08-24 NOTE — Discharge Summary (Signed)
Obstetric Discharge Summary Reason for Admission: onset of labor Prenatal Procedures: none, NST, Preeclampsia and ultrasound Intrapartum Procedures: spontaneous vaginal delivery and MgSO4 Postpartum Procedures: none Complications-Operative and Postpartum: none Hemoglobin  Date Value Range Status  08/23/2012 7.6* 12.0 - 15.0 g/dL Final     HCT  Date Value Range Status  08/23/2012 23.9* 36.0 - 46.0 % Final    Physical Exam:  General: alert, cooperative and no distress Lochia: appropriate Uterine Fundus: firm Incision: n/a  DVT Evaluation: No evidence of DVT seen on physical exam.  Discharge Diagnoses: Term Pregnancy-delivered and Preelampsia  Discharge Information: Date: 08/24/2012 Activity: unrestricted Diet: routine Medications: Ibuprofen Condition: stable Instructions: refer to practice specific booklet Discharge to: home Follow-up Information    Follow up with [facility]. Schedule an appointment as soon as possible for a visit in 5 weeks.         Newborn Data: Live born female  Birth Weight: 6 lb 11.9 oz (3060 g) APGAR: 9, 9  Home with mother.  [name],[name] Zeiser 08/24/2012, 8:02 AM

## 2012-08-26 ENCOUNTER — Encounter (HOSPITAL_COMMUNITY): Payer: Self-pay | Admitting: *Deleted

## 2012-08-26 ENCOUNTER — Inpatient Hospital Stay (HOSPITAL_COMMUNITY)
Admission: AD | Admit: 2012-08-26 | Discharge: 2012-08-29 | DRG: 776 | Disposition: A | Discharge status: Home / Self Care | Payer: Medicaid Other | Source: Ambulatory Visit | Attending: Family Medicine | Admitting: Family Medicine

## 2012-08-26 DIAGNOSIS — O1495 Unspecified pre-eclampsia, complicating the puerperium: Secondary | ICD-10-CM | POA: Diagnosis present

## 2012-08-26 DIAGNOSIS — R748 Abnormal levels of other serum enzymes: Secondary | ICD-10-CM

## 2012-08-26 DIAGNOSIS — IMO0002 Reserved for concepts with insufficient information to code with codable children: Principal | ICD-10-CM | POA: Diagnosis present

## 2012-08-26 LAB — URINALYSIS, ROUTINE W REFLEX MICROSCOPIC
Glucose, UA: NEGATIVE mg/dL
Ketones, ur: NEGATIVE mg/dL
Protein, ur: 30 mg/dL — AB

## 2012-08-26 LAB — CBC
HCT: 24.7 % — ABNORMAL LOW (ref 36.0–46.0)
MCHC: 31.6 g/dL (ref 30.0–36.0)
RDW: 16.2 % — ABNORMAL HIGH (ref 11.5–15.5)

## 2012-08-26 LAB — COMPREHENSIVE METABOLIC PANEL
ALT: 66 U/L — ABNORMAL HIGH (ref 0–35)
AST: 58 U/L — ABNORMAL HIGH (ref 0–37)
Albumin: 2.1 g/dL — ABNORMAL LOW (ref 3.5–5.2)
Alkaline Phosphatase: 123 U/L — ABNORMAL HIGH (ref 39–117)
Calcium: 8.8 mg/dL (ref 8.4–10.5)
GFR calc Af Amer: >90 mL/min (ref >90)
Potassium: 4.4 mEq/L (ref 3.5–5.1)
Sodium: 135 mEq/L (ref 135–145)
Total Protein: 5.6 g/dL — ABNORMAL LOW (ref 6.0–8.3)

## 2012-08-26 LAB — URINE MICROSCOPIC-ADD ON

## 2012-08-26 MED ORDER — IBUPROFEN 600 MG PO TABS
600.0000 mg | ORAL_TABLET | Freq: Once | ORAL | Status: AC
  Administered 2012-08-26: 600 mg via ORAL
  Filled 2012-08-26: qty 1

## 2012-08-26 MED ORDER — MAGNESIUM SULFATE 40 G IN LACTATED RINGERS - SIMPLE
2.0000 g/h | INTRAVENOUS | Status: DC
  Administered 2012-08-26 – 2012-08-27 (×2): 2 g/h via INTRAVENOUS
  Filled 2012-08-26: qty 500

## 2012-08-26 MED ORDER — SODIUM CHLORIDE 0.9 % IV SOLN
INTRAVENOUS | Status: DC
  Administered 2012-08-26 – 2012-08-27 (×3): via INTRAVENOUS

## 2012-08-26 MED ORDER — IBUPROFEN 600 MG PO TABS
600.0000 mg | ORAL_TABLET | Freq: Four times a day (QID) | ORAL | Status: DC
  Administered 2012-08-27 – 2012-08-29 (×9): 600 mg via ORAL
  Filled 2012-08-26 (×9): qty 1

## 2012-08-26 MED ORDER — MAGNESIUM SULFATE BOLUS VIA INFUSION
4.0000 g | Freq: Once | INTRAVENOUS | Status: AC
  Administered 2012-08-26: 4 g via INTRAVENOUS
  Filled 2012-08-26: qty 500

## 2012-08-26 MED ORDER — PRENATAL MULTIVITAMIN CH
1.0000 | ORAL_TABLET | Freq: Every day | ORAL | Status: DC
  Administered 2012-08-27 – 2012-08-28 (×2): 1 via ORAL
  Filled 2012-08-26 (×2): qty 1

## 2012-08-26 NOTE — MAU Provider Note (Signed)
History     CSN: [id number]  Arrival date and time: 08/26/12 1923   None     Chief Complaint  Patient presents with  . Headache  . Blurred Vision  . Dizziness  . Vaginal Pain   HPI  Pt is here status post a NSVD on 08/22/12.  Diagnosed with preeclampsia post delivery and treated with magnesium sulfate for 24 hours.  At discharge pt vitals were wnl.  Here tonight with report of having blurred vision and a mild headache.  Headache not present at this time, but pt reports dizziness.  Also reports pain at laceration repair site with urination.  Reports moderate amount of bleeding with use of one pad every 3-4 hours.  Passes small clots approximately twice today.     Past Medical History  Diagnosis Date  . Acid reflux   . Anxiety     hx of anxiety attack    Past Surgical History  Procedure Date  . No past surgeries   . Wisdom tooth extraction     Family History  Problem Relation Age of Onset  . Other Neg Hx   . Asthma Sister   . Hypertension Brother   . Asthma Brother   . Diabetes Maternal Grandmother     History  Substance Use Topics  . Smoking status: Never Smoker   . Smokeless tobacco: Not on file  . Alcohol Use: No    Allergies: No Known Allergies  Prescriptions prior to admission  Medication Sig Dispense Refill  . ibuprofen (ADVIL,MOTRIN) 600 MG tablet Take 1 tablet (600 mg total) by mouth every 6 (six) hours.  30 tablet  0  . Prenatal Vit-Fe Fumarate-FA (PRENATAL MULTIVITAMIN) TABS Take 1 tablet by mouth daily.        Review of Systems  Constitutional: Positive for malaise/fatigue.  Eyes: Positive for blurred vision. Negative for double vision, photophobia and pain.  Respiratory: Negative.   Cardiovascular: Negative.   Gastrointestinal: Positive for abdominal pain (cramping). Negative for nausea and vomiting.  Genitourinary: Positive for dysuria (at lac repair site).  Neurological: Positive for dizziness and headaches. Negative for tingling, tremors,  sensory change, speech change, focal weakness, seizures and loss of consciousness.   Physical Exam   Blood pressure 129/74, pulse 77, temperature 98.3 F (36.8 C), temperature source Oral, resp. rate 20, height 5\' 5"  (1.651 m), weight 73.664 kg (162 lb 6.4 oz), last menstrual period 11/26/2011, not currently breastfeeding.  Physical Exam  Constitutional: She is oriented to person, place, and time. She appears well-developed and well-nourished. No distress.  HENT:  Head: Normocephalic.  Neck: Normal range of motion. Neck supple.  Cardiovascular: Normal rate, regular rhythm and normal heart sounds.  Exam reveals no gallop and no friction rub.   No murmur heard. Respiratory: Effort normal and breath sounds normal. No respiratory distress.  GI: Soft. There is tenderness (with deep palpation; 4 below umbilicus). There is no rebound and no guarding.  Genitourinary: There is bleeding (scant; dark red; no bright red or brisk bleed ) around the vagina. Vaginal discharge: vaginal bleeding.  Musculoskeletal: Normal range of motion. She exhibits no edema.  Neurological: She is alert and oriented to person, place, and time. She has normal reflexes. She displays normal reflexes.  Skin: Skin is warm and dry.  Psychiatric: She has a normal mood and affect. Her behavior is normal. Thought content normal.    MAU Course  Procedures  Results for orders placed during the hospital encounter of 08/26/12 (from the past  24 hour(s))  URINALYSIS, ROUTINE W REFLEX MICROSCOPIC     Status: Abnormal   Collection Time   08/26/12  8:15 PM      Component Value Range   Color, Urine YELLOW  YELLOW   APPearance CLEAR  CLEAR   Specific Gravity, Urine 1.010  1.005 - 1.030   pH 6.5  5.0 - 8.0   Glucose, UA NEGATIVE  NEGATIVE mg/dL   Hgb urine dipstick LARGE (*) NEGATIVE   Bilirubin Urine NEGATIVE  NEGATIVE   Ketones, ur NEGATIVE  NEGATIVE mg/dL   Protein, ur 30 (*) NEGATIVE mg/dL   Urobilinogen, UA 0.2  0.0 - 1.0  mg/dL   Nitrite NEGATIVE  NEGATIVE   Leukocytes, UA SMALL (*) NEGATIVE  URINE MICROSCOPIC-ADD ON     Status: Normal   Collection Time   08/26/12  8:15 PM      Component Value Range   Squamous Epithelial / LPF RARE  RARE   WBC, UA 3-6  <3 WBC/hpf   RBC / HPF 11-20  <3 RBC/hpf  CBC     Status: Abnormal   Collection Time   08/26/12  8:46 PM      Component Value Range   WBC 10.7 (*) 4.0 - 10.5 K/uL   RBC 2.89 (*) 3.87 - 5.11 MIL/uL   Hemoglobin 7.8 (*) 12.0 - 15.0 g/dL   HCT 84.6 (*) 96.2 - 95.2 %   MCV 85.5  78.0 - 100.0 fL   MCH 27.0  26.0 - 34.0 pg   MCHC 31.6  30.0 - 36.0 g/dL   RDW 84.1 (*) 32.4 - 40.1 %   Platelets 307  150 - 400 K/uL  COMPREHENSIVE METABOLIC PANEL     Status: Abnormal   Collection Time   08/26/12  8:46 PM      Component Value Range   Sodium 135  135 - 145 mEq/L   Potassium 4.4  3.5 - 5.1 mEq/L   Chloride 100  96 - 112 mEq/L   CO2 26  19 - 32 mEq/L   Glucose, Bld 98  70 - 99 mg/dL   BUN 7  6 - 23 mg/dL   Creatinine, Ser 0.27  0.50 - 1.10 mg/dL   Calcium 8.8  8.4 - 25.3 mg/dL   Total Protein 5.6 (*) 6.0 - 8.3 g/dL   Albumin 2.1 (*) 3.5 - 5.2 g/dL   AST 58 (*) 0 - 37 U/L   ALT 66 (*) 0 - 35 U/L   Alkaline Phosphatase 123 (*) 39 - 117 U/L   Total Bilirubin 0.1 (*) 0.3 - 1.2 mg/dL   GFR calc non Af Amer >90  >90 mL/min   GFR calc Af Amer >90  >90 mL/min   Consulted with Dr. Shawnie Pons, reviewed HPI and lab results > admit for Magnesium Sulfate  Assessment and Plan  Headache Elevated Liver Enzymes  Plan: Admit to AICU Magnesium Sulfate x 24 hrs  [facility] 08/26/2012, 8:35 PM

## 2012-08-26 NOTE — MAU Note (Signed)
Last night started having blurred vision. Headache when I woke up this afternoon at 1500. Dizziness since last night. STitches sore

## 2012-08-26 NOTE — MAU Note (Signed)
Blurry vision last pm. States having cramps, last took ibuprofen yesterday.

## 2012-08-26 NOTE — MAU Provider Note (Signed)
Chart reviewed and agree with management and plan.  

## 2012-08-26 NOTE — H&P (Signed)
History     CSN: [id number]  Arrival date and time: 08/26/12 1923   None     Chief Complaint  Patient presents with  . Headache  . Blurred Vision  . Dizziness  . Vaginal Pain   HPI  Pt is here status post a NSVD on 08/22/12.  Diagnosed with preeclampsia post delivery and treated with magnesium sulfate for 24 hours.  At discharge pt vitals were wnl.  Here tonight with report of having blurred vision and a mild headache.  Headache not present at this time, but pt reports dizziness.  Also reports pain at laceration repair site with urination.  Reports moderate amount of bleeding with use of one pad every 3-4 hours.  Passes small clots approximately twice today.     Past Medical History  Diagnosis Date  . Acid reflux   . Anxiety     hx of anxiety attack    Past Surgical History  Procedure Date  . No past surgeries   . Wisdom tooth extraction     Family History  Problem Relation Age of Onset  . Other Neg Hx   . Asthma Sister   . Hypertension Brother   . Asthma Brother   . Diabetes Maternal Grandmother     History  Substance Use Topics  . Smoking status: Never Smoker   . Smokeless tobacco: Not on file  . Alcohol Use: No    Allergies: No Known Allergies  Prescriptions prior to admission  Medication Sig Dispense Refill  . ibuprofen (ADVIL,MOTRIN) 600 MG tablet Take 1 tablet (600 mg total) by mouth every 6 (six) hours.  30 tablet  0  . Prenatal Vit-Fe Fumarate-FA (PRENATAL MULTIVITAMIN) TABS Take 1 tablet by mouth daily.        Review of Systems  Constitutional: Positive for malaise/fatigue.  Eyes: Positive for blurred vision. Negative for double vision, photophobia and pain.  Respiratory: Negative.   Cardiovascular: Negative.   Gastrointestinal: Positive for abdominal pain (cramping). Negative for nausea and vomiting.  Genitourinary: Positive for dysuria (at lac repair site).  Neurological: Positive for dizziness and headaches. Negative for tingling, tremors,  sensory change, speech change, focal weakness, seizures and loss of consciousness.   Physical Exam   Blood pressure 129/74, pulse 77, temperature 98.3 F (36.8 C), temperature source Oral, resp. rate 20, height 5' 5" (1.651 m), weight 73.664 kg (162 lb 6.4 oz), last menstrual period 11/26/2011, not currently breastfeeding.  Physical Exam  Constitutional: She is oriented to person, place, and time. She appears well-developed and well-nourished. No distress.  HENT:  Head: Normocephalic.  Neck: Normal range of motion. Neck supple.  Cardiovascular: Normal rate, regular rhythm and normal heart sounds.  Exam reveals no gallop and no friction rub.   No murmur heard. Respiratory: Effort normal and breath sounds normal. No respiratory distress.  GI: Soft. There is tenderness (with deep palpation; 4 below umbilicus). There is no rebound and no guarding.  Genitourinary: There is bleeding (scant; dark red; no bright red or brisk bleed ) around the vagina. Vaginal discharge: vaginal bleeding.  Musculoskeletal: Normal range of motion. She exhibits no edema.  Neurological: She is alert and oriented to person, place, and time. She has normal reflexes. She displays normal reflexes.  Skin: Skin is warm and dry.  Psychiatric: She has a normal mood and affect. Her behavior is normal. Thought content normal.    MAU Course  Procedures  Results for orders placed during the hospital encounter of 08/26/12 (from the past   24 hour(s))  URINALYSIS, ROUTINE W REFLEX MICROSCOPIC     Status: Abnormal   Collection Time   08/26/12  8:15 PM      Component Value Range   Color, Urine YELLOW  YELLOW   APPearance CLEAR  CLEAR   Specific Gravity, Urine 1.010  1.005 - 1.030   pH 6.5  5.0 - 8.0   Glucose, UA NEGATIVE  NEGATIVE mg/dL   Hgb urine dipstick LARGE (*) NEGATIVE   Bilirubin Urine NEGATIVE  NEGATIVE   Ketones, ur NEGATIVE  NEGATIVE mg/dL   Protein, ur 30 (*) NEGATIVE mg/dL   Urobilinogen, UA 0.2  0.0 - 1.0  mg/dL   Nitrite NEGATIVE  NEGATIVE   Leukocytes, UA SMALL (*) NEGATIVE  URINE MICROSCOPIC-ADD ON     Status: Normal   Collection Time   08/26/12  8:15 PM      Component Value Range   Squamous Epithelial / LPF RARE  RARE   WBC, UA 3-6  <3 WBC/hpf   RBC / HPF 11-20  <3 RBC/hpf  CBC     Status: Abnormal   Collection Time   08/26/12  8:46 PM      Component Value Range   WBC 10.7 (*) 4.0 - 10.5 K/uL   RBC 2.89 (*) 3.87 - 5.11 MIL/uL   Hemoglobin 7.8 (*) 12.0 - 15.0 g/dL   HCT 24.7 (*) 36.0 - 46.0 %   MCV 85.5  78.0 - 100.0 fL   MCH 27.0  26.0 - 34.0 pg   MCHC 31.6  30.0 - 36.0 g/dL   RDW 16.2 (*) 11.5 - 15.5 %   Platelets 307  150 - 400 K/uL  COMPREHENSIVE METABOLIC PANEL     Status: Abnormal   Collection Time   08/26/12  8:46 PM      Component Value Range   Sodium 135  135 - 145 mEq/L   Potassium 4.4  3.5 - 5.1 mEq/L   Chloride 100  96 - 112 mEq/L   CO2 26  19 - 32 mEq/L   Glucose, Bld 98  70 - 99 mg/dL   BUN 7  6 - 23 mg/dL   Creatinine, Ser 0.68  0.50 - 1.10 mg/dL   Calcium 8.8  8.4 - 10.5 mg/dL   Total Protein 5.6 (*) 6.0 - 8.3 g/dL   Albumin 2.1 (*) 3.5 - 5.2 g/dL   AST 58 (*) 0 - 37 U/L   ALT 66 (*) 0 - 35 U/L   Alkaline Phosphatase 123 (*) 39 - 117 U/L   Total Bilirubin 0.1 (*) 0.3 - 1.2 mg/dL   GFR calc non Af Amer >90  >90 mL/min   GFR calc Af Amer >90  >90 mL/min   Consulted with Dr. Pratt, reviewed HPI and lab results > admit for Magnesium Sulfate  Assessment and Plan  Headache Elevated Liver Enzymes  Plan: Admit to AICU Magnesium Sulfate x 24 hrs  MUHAMMAD,Whitni Pasquini 08/26/2012, 8:35 PM  

## 2012-08-26 NOTE — H&P (Signed)
Chart reviewed and agree with management and plan.  

## 2012-08-27 DIAGNOSIS — O1495 Unspecified pre-eclampsia, complicating the puerperium: Secondary | ICD-10-CM | POA: Diagnosis present

## 2012-08-27 DIAGNOSIS — R7402 Elevation of levels of lactic acid dehydrogenase (LDH): Secondary | ICD-10-CM

## 2012-08-27 LAB — COMPREHENSIVE METABOLIC PANEL
AST: 48 U/L — ABNORMAL HIGH (ref 0–37)
Albumin: 2.3 g/dL — ABNORMAL LOW (ref 3.5–5.2)
Alkaline Phosphatase: 128 U/L — ABNORMAL HIGH (ref 39–117)
BUN: 5 mg/dL — ABNORMAL LOW (ref 6–23)
CO2: 27 mEq/L (ref 19–32)
Chloride: 100 mEq/L (ref 96–112)
Creatinine, Ser: 0.61 mg/dL (ref 0.50–1.10)
GFR calc non Af Amer: >90 mL/min (ref >90)
Potassium: 4.3 mEq/L (ref 3.5–5.1)
Total Bilirubin: 0.1 mg/dL — ABNORMAL LOW (ref 0.3–1.2)

## 2012-08-27 LAB — URINE MICROSCOPIC-ADD ON

## 2012-08-27 LAB — URINALYSIS, ROUTINE W REFLEX MICROSCOPIC
Bilirubin Urine: NEGATIVE
Glucose, UA: NEGATIVE mg/dL
Ketones, ur: NEGATIVE mg/dL
Protein, ur: NEGATIVE mg/dL
pH: 7 (ref 5.0–8.0)

## 2012-08-27 LAB — CBC
HCT: 26.8 % — ABNORMAL LOW (ref 36.0–46.0)
MCV: 84.5 fL (ref 78.0–100.0)
RBC: 3.17 MIL/uL — ABNORMAL LOW (ref 3.87–5.11)
RDW: 16.2 % — ABNORMAL HIGH (ref 11.5–15.5)
WBC: 9.1 10*3/uL (ref 4.0–10.5)

## 2012-08-27 LAB — MRSA PCR SCREENING: MRSA by PCR: NEGATIVE

## 2012-08-27 MED ORDER — OXYCODONE-ACETAMINOPHEN 5-325 MG PO TABS
1.0000 | ORAL_TABLET | Freq: Once | ORAL | Status: AC
  Administered 2012-08-27: 1 via ORAL
  Filled 2012-08-27: qty 1

## 2012-08-27 NOTE — Progress Notes (Signed)
Pt called out and Tia Alert RN in went in to assess. Pt had fallen asleep after being given kpad to adomen and scd's to lower extremities. She woke up afraid and felt discomfort in her chest. BP and O2 sat evaluated. Pt assisted to BR. Gown changed, pericare given. Pt looks better and talks well when assisted to bed. Given some apple juice.

## 2012-08-27 NOTE — Progress Notes (Signed)
Dr ferry consulted to see if pt could have social work consult before discharge. Pt has not had any visitors today. Pt's mother has called to check on her several times. Also, when pt's meal came at 1650 pt seem confused how to operate the bed and sit up to eat meal.

## 2012-08-27 NOTE — Progress Notes (Signed)
Dr on call updated on pt's c/o  Intermittent headache, inner leg pain, mother's report of pt's leg pains, pt's c/o cramping unrelieved by scheduled cramping and 0600 percocet. Pt can appear calm and resting one moment and holding stomach another moment. Resident coming to evaluate pt.

## 2012-08-27 NOTE — Progress Notes (Signed)
Pt sitting in room talking on phone, smiling. She states she is fine and wants to order some dinner.

## 2012-08-27 NOTE — Progress Notes (Signed)
Volume double for daylight savings

## 2012-08-27 NOTE — Progress Notes (Signed)
Patient ID: [name] [name], female   DOB: 09-13-1991, 21 y.o.   MRN: [id number] S: Called by RN because pt's mother called to say pt is having cramping, pain in legs/groin. Mother requests SCDs b/c of fam hx blood clots. Pt states she has cramps off and on, feels her face is hot and burning. Not very interactive/responsive to questions so unable to elicit any other area of pain. Does c/o burning with urination.   O:   Exam limited by patient lack of cooperation. Awake, oriented Abdomen mildly tender per pt No signs of DVT  A/P K-pad, ibuprofen for cramps Vague symptoms may be due to magnesium - hot/flushed/uncomfortable Check UA given dysuria SCDs per mother's request  Napoleon Form, MD

## 2012-08-27 NOTE — Progress Notes (Addendum)
Pt called out and states she would like me to give her mother, [name], information regarding her condition when she calls the unit. Discussed with pt this morning and again now the symptoms of 2+ dtr's BP's, headache and starting magnesium to prevent seizures.

## 2012-08-27 NOTE — Progress Notes (Signed)
Pt's mother selena coletta called and requests how pt is doing. Pt had given permission several times today to update her mother. Pt's mother had also called several times today and I had been in rooms with patients. Pt's mother had concerns re; pt's c/o leg cramps and headaches at home and if this issue with the illness would be resolved during this admission. 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

## 2012-08-27 NOTE — Progress Notes (Addendum)
Subjective: Patient reports slight improvement of her headache.  Some cramping and abd. Pain.  Nml lochia.  Objective: I have reviewed patient's vital signs, intake and output and labs. BP is good. Excellent UOP overnight General: alert, cooperative and appears stated age GI: soft, non-tender; bowel sounds normal; no masses,  no organomegaly Extremities: extremities normal, atraumatic, no cyanosis or edema   Assessment/Plan: PP pre-eclampsia on Magnesium x 24 hours  LOS: 1 day    [name] [name] S 08/27/2012, 7:31 AM

## 2012-08-27 NOTE — Progress Notes (Signed)
Social worker paged  

## 2012-08-27 NOTE — Progress Notes (Signed)
Pt up to Br. Pt called for assistance. Pt got lochia on her hand when pulling down underwear. Pt became very confused and upset. Pt reassured and quickly assisted to be cleaned up. Pt very anxious about this incident.

## 2012-08-28 MED ORDER — SIMETHICONE 80 MG PO CHEW: 80.0000 mg | CHEWABLE_TABLET | ORAL | Status: DC | PRN

## 2012-08-28 MED ORDER — SENNOSIDES-DOCUSATE SODIUM 8.6-50 MG PO TABS
2.0000 | ORAL_TABLET | Freq: Every day | ORAL | Status: DC
  Administered 2012-08-28: 2 via ORAL

## 2012-08-28 MED ORDER — OXYCODONE-ACETAMINOPHEN 5-325 MG PO TABS: 1.0000 | ORAL_TABLET | ORAL | Status: DC | PRN

## 2012-08-28 MED ORDER — WITCH HAZEL-GLYCERIN EX PADS: 1.0000 "application " | MEDICATED_PAD | CUTANEOUS | Status: DC | PRN

## 2012-08-28 MED ORDER — TETANUS-DIPHTH-ACELL PERTUSSIS 5-2.5-18.5 LF-MCG/0.5 IM SUSP: 0.5000 mL | Freq: Once | INTRAMUSCULAR | Status: DC

## 2012-08-28 MED ORDER — IBUPROFEN 600 MG PO TABS: 600.0000 mg | ORAL_TABLET | Freq: Four times a day (QID) | ORAL | Status: DC

## 2012-08-28 MED ORDER — PRENATAL MULTIVITAMIN CH
1.0000 | ORAL_TABLET | Freq: Every day | ORAL | Status: DC
  Administered 2012-08-28 – 2012-08-29 (×2): 1 via ORAL
  Filled 2012-08-28 (×2): qty 1

## 2012-08-28 MED ORDER — ONDANSETRON HCL 4 MG PO TABS: 4.0000 mg | ORAL_TABLET | ORAL | Status: DC | PRN

## 2012-08-28 MED ORDER — DIPHENHYDRAMINE HCL 25 MG PO CAPS: 25.0000 mg | ORAL_CAPSULE | Freq: Four times a day (QID) | ORAL | Status: DC | PRN

## 2012-08-28 MED ORDER — LANOLIN HYDROUS EX OINT: TOPICAL_OINTMENT | CUTANEOUS | Status: DC | PRN

## 2012-08-28 MED ORDER — BENZOCAINE-MENTHOL 20-0.5 % EX AERO: 1.0000 "application " | INHALATION_SPRAY | CUTANEOUS | Status: DC | PRN

## 2012-08-28 MED ORDER — ZOLPIDEM TARTRATE 5 MG PO TABS: 5.0000 mg | ORAL_TABLET | Freq: Every evening | ORAL | Status: DC | PRN

## 2012-08-28 MED ORDER — ONDANSETRON HCL 4 MG/2ML IJ SOLN: 4.0000 mg | INTRAMUSCULAR | Status: DC | PRN

## 2012-08-28 MED ORDER — DIBUCAINE 1 % RE OINT: 1.0000 "application " | TOPICAL_OINTMENT | RECTAL | Status: DC | PRN

## 2012-08-28 NOTE — Progress Notes (Signed)
Post Partum Day 6 Subjective: no complaints, up ad lib, voiding and tolerating PO  Objective: Blood pressure 125/81, pulse 76, temperature 98.8 F (37.1 C), temperature source Oral, resp. rate 18, height 5\' 5"  (1.651 m), weight 162 lb 6.4 oz (73.664 kg), SpO2 100.00%, not currently breastfeeding.  Physical Exam:  General: alert, cooperative and no distress Lochia: appropriate Uterine Fundus: firm Incision: na DVT Evaluation: No evidence of DVT seen on physical exam.  Recent Results (from the past 48 hour(s))  URINALYSIS, ROUTINE W REFLEX MICROSCOPIC   Collection Time   08/26/12  8:15 PM      Component Value Range   Color, Urine YELLOW  YELLOW   APPearance CLEAR  CLEAR   Specific Gravity, Urine 1.010  1.005 - 1.030   pH 6.5  5.0 - 8.0   Glucose, UA NEGATIVE  NEGATIVE mg/dL   Hgb urine dipstick LARGE (*) NEGATIVE   Bilirubin Urine NEGATIVE  NEGATIVE   Ketones, ur NEGATIVE  NEGATIVE mg/dL   Protein, ur 30 (*) NEGATIVE mg/dL   Urobilinogen, UA 0.2  0.0 - 1.0 mg/dL   Nitrite NEGATIVE  NEGATIVE   Leukocytes, UA SMALL (*) NEGATIVE  URINE MICROSCOPIC-ADD ON   Collection Time   08/26/12  8:15 PM      Component Value Range   Squamous Epithelial / LPF RARE  RARE   WBC, UA 3-6  <3 WBC/hpf   RBC / HPF 11-20  <3 RBC/hpf  CBC   Collection Time   08/26/12  8:46 PM      Component Value Range   WBC 10.7 (*) 4.0 - 10.5 K/uL   RBC 2.89 (*) 3.87 - 5.11 MIL/uL   Hemoglobin 7.8 (*) 12.0 - 15.0 g/dL   HCT 47.8 (*) 29.5 - 62.1 %   MCV 85.5  78.0 - 100.0 fL   MCH 27.0  26.0 - 34.0 pg   MCHC 31.6  30.0 - 36.0 g/dL   RDW 30.8 (*) 65.7 - 84.6 %   Platelets 307  150 - 400 K/uL  COMPREHENSIVE METABOLIC PANEL   Collection Time   08/26/12  8:46 PM      Component Value Range   Sodium 135  135 - 145 mEq/L   Potassium 4.4  3.5 - 5.1 mEq/L   Chloride 100  96 - 112 mEq/L   CO2 26  19 - 32 mEq/L   Glucose, Bld 98  70 - 99 mg/dL   BUN 7  6 - 23 mg/dL   Creatinine, Ser 9.62  0.50 - 1.10 mg/dL   Calcium 8.8  8.4 - 95.2 mg/dL   Total Protein 5.6 (*) 6.0 - 8.3 g/dL   Albumin 2.1 (*) 3.5 - 5.2 g/dL   AST 58 (*) 0 - 37 U/L   ALT 66 (*) 0 - 35 U/L   Alkaline Phosphatase 123 (*) 39 - 117 U/L   Total Bilirubin 0.1 (*) 0.3 - 1.2 mg/dL   GFR calc non Af Amer >90  >90 mL/min   GFR calc Af Amer >90  >90 mL/min  MRSA PCR SCREENING   Collection Time   08/26/12 11:20 PM      Component Value Range   MRSA by PCR NEGATIVE  NEGATIVE  CBC   Collection Time   08/27/12  5:45 AM      Component Value Range   WBC 9.1  4.0 - 10.5 K/uL   RBC 3.17 (*) 3.87 - 5.11 MIL/uL   Hemoglobin 8.3 (*) 12.0 - 15.0 g/dL  HCT 26.8 (*) 36.0 - 46.0 %   MCV 84.5  78.0 - 100.0 fL   MCH 26.2  26.0 - 34.0 pg   MCHC 31.0  30.0 - 36.0 g/dL   RDW 78.2 (*) 95.6 - 21.3 %   Platelets 321  150 - 400 K/uL  COMPREHENSIVE METABOLIC PANEL   Collection Time   08/27/12  5:45 AM      Component Value Range   Sodium 136  135 - 145 mEq/L   Potassium 4.3  3.5 - 5.1 mEq/L   Chloride 100  96 - 112 mEq/L   CO2 27  19 - 32 mEq/L   Glucose, Bld 83  70 - 99 mg/dL   BUN 5 (*) 6 - 23 mg/dL   Creatinine, Ser 0.86  0.50 - 1.10 mg/dL   Calcium 7.9 (*) 8.4 - 10.5 mg/dL   Total Protein 6.3  6.0 - 8.3 g/dL   Albumin 2.3 (*) 3.5 - 5.2 g/dL   AST 48 (*) 0 - 37 U/L   ALT 61 (*) 0 - 35 U/L   Alkaline Phosphatase 128 (*) 39 - 117 U/L   Total Bilirubin 0.1 (*) 0.3 - 1.2 mg/dL   GFR calc non Af Amer >90  >90 mL/min   GFR calc Af Amer >90  >90 mL/min  URINALYSIS, ROUTINE W REFLEX MICROSCOPIC   Collection Time   08/27/12  3:10 PM      Component Value Range   Color, Urine YELLOW  YELLOW   APPearance HAZY (*) CLEAR   Specific Gravity, Urine >1.030 (*) 1.005 - 1.030   pH 7.0  5.0 - 8.0   Glucose, UA NEGATIVE  NEGATIVE mg/dL   Hgb urine dipstick LARGE (*) NEGATIVE   Bilirubin Urine NEGATIVE  NEGATIVE   Ketones, ur NEGATIVE  NEGATIVE mg/dL   Protein, ur NEGATIVE  NEGATIVE mg/dL   Urobilinogen, UA 0.2  0.0 - 1.0 mg/dL   Nitrite NEGATIVE   NEGATIVE   Leukocytes, UA SMALL (*) NEGATIVE  URINE MICROSCOPIC-ADD ON   Collection Time   08/27/12  3:10 PM      Component Value Range   Squamous Epithelial / LPF FEW (*) RARE   WBC, UA 11-20  <3 WBC/hpf   RBC / HPF 7-10  <3 RBC/hpf   Bacteria, UA FEW (*) RARE     Recent Results (from the past 24 hour(s))  URINALYSIS, ROUTINE W REFLEX MICROSCOPIC   Collection Time   08/27/12  3:10 PM      Component Value Range   Color, Urine YELLOW  YELLOW   APPearance HAZY (*) CLEAR   Specific Gravity, Urine >1.030 (*) 1.005 - 1.030   pH 7.0  5.0 - 8.0   Glucose, UA NEGATIVE  NEGATIVE mg/dL   Hgb urine dipstick LARGE (*) NEGATIVE   Bilirubin Urine NEGATIVE  NEGATIVE   Ketones, ur NEGATIVE  NEGATIVE mg/dL   Protein, ur NEGATIVE  NEGATIVE mg/dL   Urobilinogen, UA 0.2  0.0 - 1.0 mg/dL   Nitrite NEGATIVE  NEGATIVE   Leukocytes, UA SMALL (*) NEGATIVE  URINE MICROSCOPIC-ADD ON   Collection Time   08/27/12  3:10 PM      Component Value Range   Squamous Epithelial / LPF FEW (*) RARE   WBC, UA 11-20  <3 WBC/hpf   RBC / HPF 7-10  <3 RBC/hpf   Bacteria, UA FEW (*) RARE     Basename 08/27/12 0545 08/26/12 2046  HGB 8.3* 7.8*  HCT 26.8* 24.7*  Assessment/Plan: Plan for discharge tomorrow or maybe today depending on patient response   LOS: 2 days   EURE,LUTHER H 08/28/2012, 7:17 AM

## 2012-08-28 NOTE — Discharge Summary (Signed)
Attestation of Attending Supervision of Advanced Practitioner: Evaluation and management procedures were performed by the PA/NP/CNM/OB Fellow under my supervision/collaboration. Chart reviewed and agree with management and plan.  Shanta Hartner V 08/28/2012 12:19 AM    

## 2012-08-28 NOTE — Progress Notes (Signed)
CSW received consult for developmental delays and concerns regarding living situation and ability to care for baby.  CSW notes that patient was seen during her delivery admission and these issues were addressed with patient and her mother, who provides care to her and her newborn.  (See documentation by Nobie Putnam, LCSWA 08/24/12)    Per Ms. Slade's note, referrals to [facility] and [facility] were made.  CSW met with patient to see if community supports have made contact with her.  Patient appropriately conversed with CSW and states that  She is happy about being a mom and that she lives with her mother who helps her with her baby.  She reports having other family support as well.  She states she has not heard from any of the agencies where referrals were made.  CSW will contact these agencies to follow up and also recommends [facility], which patient agreed to.  CSW notes that there is a waiting list for [facility].  Patient stated understanding.  CSW identifies no barriers to d/c since MOB has good natural supports and community supports have been contacted as well.

## 2012-08-29 LAB — COMPREHENSIVE METABOLIC PANEL
AST: 19 U/L (ref 0–37)
Albumin: 2 g/dL — ABNORMAL LOW (ref 3.5–5.2)
Alkaline Phosphatase: 117 U/L (ref 39–117)
BUN: 10 mg/dL (ref 6–23)
CO2: 24 mEq/L (ref 19–32)
Chloride: 99 mEq/L (ref 96–112)
Creatinine, Ser: 0.68 mg/dL (ref 0.50–1.10)
GFR calc non Af Amer: >90 mL/min (ref >90)
Potassium: 4.7 mEq/L (ref 3.5–5.1)
Total Bilirubin: 0.1 mg/dL — ABNORMAL LOW (ref 0.3–1.2)

## 2012-08-29 LAB — URINE CULTURE: Special Requests: NORMAL

## 2012-08-29 LAB — CBC
HCT: 25.1 % — ABNORMAL LOW (ref 36.0–46.0)
MCV: 85.7 fL (ref 78.0–100.0)
RBC: 2.93 MIL/uL — ABNORMAL LOW (ref 3.87–5.11)
WBC: 11.4 10*3/uL — ABNORMAL HIGH (ref 4.0–10.5)

## 2012-08-29 NOTE — Progress Notes (Signed)
Patient discharged home.  Patient and mother verbalized understanding of discharge instructions.

## 2012-08-29 NOTE — Discharge Summary (Signed)
Obstetric Discharge Summary Reason for Admission: headache, blurred vision 7d s/p NSVD Prenatal Procedures: Magnesium   Intrapartum Procedures: spontaneous vaginal delivery, 08/22/2012 Postpartum Procedures: magnesium sulfate Complications-Operative and Postpartum: 7d s/p NSVD readmitted with PreE signs/symptoms Hemoglobin  Date Value Range Status  08/29/2012 7.6* 12.0 - 15.0 g/dL Final     HCT  Date Value Range Status  08/29/2012 25.1* 36.0 - 46.0 % Final    Physical Exam:  General: alert and cooperative CV: RRR. S1 and S2. 1/6 SM.  Lungs: CTAB. No wheezing or rales. ABD: Soft. Nontender. BS+. No HSM Lochia: appropriate DVT Evaluation: No evidence of DVT seen on physical exam. Negative Homan's sign. No cords or calf tenderness.  Discharge Diagnoses: Preelampsia Results for orders placed during the hospital encounter of 08/26/12 (from the past 24 hour(s))  CBC     Status: Abnormal   Collection Time   08/29/12  5:38 AM      Component Value Range   WBC 11.4 (*) 4.0 - 10.5 K/uL   RBC 2.93 (*) 3.87 - 5.11 MIL/uL   Hemoglobin 7.6 (*) 12.0 - 15.0 g/dL   HCT 16.1 (*) 09.6 - 04.5 %   MCV 85.7  78.0 - 100.0 fL   MCH 25.9 (*) 26.0 - 34.0 pg   MCHC 30.3  30.0 - 36.0 g/dL   RDW 40.9 (*) 81.1 - 91.4 %   Platelets 359  150 - 400 K/uL  COMPREHENSIVE METABOLIC PANEL     Status: Abnormal   Collection Time   08/29/12  7:43 AM      Component Value Range   Sodium 133 (*) 135 - 145 mEq/L   Potassium 4.7  3.5 - 5.1 mEq/L   Chloride 99  96 - 112 mEq/L   CO2 24  19 - 32 mEq/L   Glucose, Bld 84  70 - 99 mg/dL   BUN 10  6 - 23 mg/dL   Creatinine, Ser 7.82  0.50 - 1.10 mg/dL   Calcium 8.4  8.4 - 95.6 mg/dL   Total Protein 5.9 (*) 6.0 - 8.3 g/dL   Albumin 2.0 (*) 3.5 - 5.2 g/dL   AST 19  0 - 37 U/L   ALT 29  0 - 35 U/L   Alkaline Phosphatase 117  39 - 117 U/L   Total Bilirubin 0.1 (*) 0.3 - 1.2 mg/dL   GFR calc non Af Amer >90  >90 mL/min   GFR calc Af Amer >90  >90 mL/min    Discharge  Information: Date: 08/29/2012 Activity: pelvic rest Diet: routine Medications: None Condition: stable Instructions: avs Discharge OZ:HYQM   Felix Pacini 08/29/2012, 8:53 AM

## 2012-08-29 NOTE — Progress Notes (Signed)
Post discharge UR review completed. 

## 2012-08-29 NOTE — Progress Notes (Signed)
TDap vaccine not given as ordered because it was given on previous admission 08/23/12.

## 2012-09-05 NOTE — Discharge Summary (Signed)
Medical Screening exam and patient care preformed by advanced practice provider.  Agree with the above management.  

## 2012-11-15 ENCOUNTER — Emergency Department (HOSPITAL_COMMUNITY)
Admission: EM | Admit: 2012-11-15 | Discharge: 2012-11-15 | Disposition: A | Discharge status: Home / Self Care | Payer: Medicaid Other | Attending: Emergency Medicine | Admitting: Emergency Medicine

## 2012-11-15 ENCOUNTER — Emergency Department (HOSPITAL_COMMUNITY): Payer: Medicaid Other

## 2012-11-15 DIAGNOSIS — N898 Other specified noninflammatory disorders of vagina: Secondary | ICD-10-CM | POA: Insufficient documentation

## 2012-11-15 DIAGNOSIS — Z79899 Other long term (current) drug therapy: Secondary | ICD-10-CM | POA: Insufficient documentation

## 2012-11-15 DIAGNOSIS — N730 Acute parametritis and pelvic cellulitis: Secondary | ICD-10-CM

## 2012-11-15 DIAGNOSIS — Z8659 Personal history of other mental and behavioral disorders: Secondary | ICD-10-CM | POA: Insufficient documentation

## 2012-11-15 DIAGNOSIS — R109 Unspecified abdominal pain: Secondary | ICD-10-CM | POA: Insufficient documentation

## 2012-11-15 DIAGNOSIS — N390 Urinary tract infection, site not specified: Secondary | ICD-10-CM | POA: Insufficient documentation

## 2012-11-15 DIAGNOSIS — Z8719 Personal history of other diseases of the digestive system: Secondary | ICD-10-CM | POA: Insufficient documentation

## 2012-11-15 DIAGNOSIS — A5609 Other chlamydial infection of lower genitourinary tract: Secondary | ICD-10-CM | POA: Insufficient documentation

## 2012-11-15 DIAGNOSIS — N739 Female pelvic inflammatory disease, unspecified: Secondary | ICD-10-CM | POA: Insufficient documentation

## 2012-11-15 DIAGNOSIS — A5400 Gonococcal infection of lower genitourinary tract, unspecified: Secondary | ICD-10-CM | POA: Insufficient documentation

## 2012-11-15 DIAGNOSIS — Z3202 Encounter for pregnancy test, result negative: Secondary | ICD-10-CM | POA: Insufficient documentation

## 2012-11-15 LAB — URINALYSIS, ROUTINE W REFLEX MICROSCOPIC
Glucose, UA: NEGATIVE mg/dL
Hgb urine dipstick: NEGATIVE
Ketones, ur: NEGATIVE mg/dL
Protein, ur: NEGATIVE mg/dL
Urobilinogen, UA: 0.2 mg/dL (ref 0.0–1.0)

## 2012-11-15 LAB — POCT PREGNANCY, URINE: Preg Test, Ur: NEGATIVE

## 2012-11-15 LAB — WET PREP, GENITAL: Yeast Wet Prep HPF POC: NONE SEEN

## 2012-11-15 MED ORDER — METRONIDAZOLE 500 MG PO TABS: 500.0000 mg | ORAL_TABLET | Freq: Two times a day (BID) | ORAL | Status: DC

## 2012-11-15 MED ORDER — DOXYCYCLINE HYCLATE 100 MG PO CAPS: 100.0000 mg | ORAL_CAPSULE | Freq: Two times a day (BID) | ORAL | Status: DC

## 2012-11-15 MED ORDER — OXYCODONE-ACETAMINOPHEN 5-325 MG PO TABS: 1.0000 | ORAL_TABLET | Freq: Four times a day (QID) | ORAL | Status: DC | PRN

## 2012-11-15 MED ORDER — AZITHROMYCIN 1 G PO PACK
1.0000 g | PACK | Freq: Once | ORAL | Status: AC
  Administered 2012-11-15: 1 g via ORAL
  Filled 2012-11-15: qty 1

## 2012-11-15 MED ORDER — CEFTRIAXONE SODIUM 250 MG IJ SOLR
250.0000 mg | Freq: Once | INTRAMUSCULAR | Status: AC
  Administered 2012-11-15: 250 mg via INTRAMUSCULAR
  Filled 2012-11-15: qty 250

## 2012-11-15 MED ORDER — LIDOCAINE HCL (PF) 1 % IJ SOLN
INTRAMUSCULAR | Status: AC
  Administered 2012-11-15: 5 mL
  Filled 2012-11-15: qty 5

## 2012-11-15 MED ORDER — CEPHALEXIN 500 MG PO CAPS: 500.0000 mg | ORAL_CAPSULE | Freq: Four times a day (QID) | ORAL | Status: DC

## 2012-11-15 NOTE — ED Notes (Signed)
Pt reports urine leaking and strong smelling urine, no burning with urination. Occasional RLQ pain, no fevers.

## 2012-11-15 NOTE — ED Provider Notes (Signed)
History     CSN: [id number]  Arrival date & time 11/15/12  1420   First MD Initiated Contact with Patient 11/15/12 1543      Chief Complaint  Patient presents with  . Urinary Tract Infection    (Consider location/radiation/quality/duration/timing/severity/associated sxs/prior treatment) HPI  Past Medical History  Diagnosis Date  . Acid reflux   . Anxiety     hx of anxiety attack    Past Surgical History  Procedure Date  . No past surgeries   . Wisdom tooth extraction     Family History  Problem Relation Age of Onset  . Other Neg Hx   . Asthma Sister   . Hypertension Brother   . Asthma Brother   . Diabetes Maternal Grandmother     History  Substance Use Topics  . Smoking status: Never Smoker   . Smokeless tobacco: Not on file  . Alcohol Use: No    OB History    Grav Para Term Preterm Abortions TAB SAB Ect Mult Living   1 1 1  0 0 0 0 0 0 1      Review of Systems  Allergies  Review of patient's allergies indicates no known allergies.  Home Medications   Current Outpatient Rx  Name  Route  Sig  Dispense  Refill  . CEPHALEXIN 500 MG PO CAPS   Oral   Take 1 capsule (500 mg total) by mouth 4 (four) times daily.   28 capsule   0   . DOXYCYCLINE HYCLATE 100 MG PO CAPS   Oral   Take 1 capsule (100 mg total) by mouth 2 (two) times daily.   20 capsule   0   . METRONIDAZOLE 500 MG PO TABS   Oral   Take 1 tablet (500 mg total) by mouth 2 (two) times daily.   14 tablet   0   . OXYCODONE-ACETAMINOPHEN 5-325 MG PO TABS   Oral   Take 1-2 tablets by mouth every 6 (six) hours as needed for pain.   20 tablet   0     BP 103/59  Pulse 67  Temp 98.1 F (36.7 C) (Oral)  Resp 18  SpO2 100%  Breastfeeding? Unknown  Physical Exam  ED Course  Procedures (including critical care time)  Labs Reviewed  URINALYSIS, ROUTINE W REFLEX MICROSCOPIC - Abnormal; Notable for the following:    APPearance HAZY (*)     Specific Gravity, Urine 1.031 (*)     Leukocytes, UA MODERATE (*)     All other components within normal limits  WET PREP, GENITAL - Abnormal; Notable for the following:    Clue Cells Wet Prep HPF POC FEW (*)     WBC, Wet Prep HPF POC MANY (*)     All other components within normal limits  URINE MICROSCOPIC-ADD ON - Abnormal; Notable for the following:    Squamous Epithelial / LPF FEW (*)     All other components within normal limits  POCT PREGNANCY, URINE  GC/CHLAMYDIA PROBE AMP   US Transvaginal Non-ob  11/15/2012  **ADDENDUM** CREATED: 11/15/2012 21:30:52  There is no evidence of tubo-ovarian abscess.  The ovaries are unremarkable in appearance.  **END ADDENDUM** SIGNED BY: Tonia Ghent, M.D.   11/15/2012  **ADDENDUM** CREATED: 11/15/2012 21:26:49  *RADIOLOGY REPORT*  Clinical Data: Vaginal discharge for 2 months.  DOPPLER ULTRASOUND OF OVARIES  Technique:  Color and duplex Doppler ultrasound was utilized to evaluate blood flow to the ovaries.  Comparison:  Prior ultrasound  of pregnancy performed 06/13/2012  Findings: Doppler evaluation demonstrates normal arterial and venous spectral waveforms with respect to both ovaries; there is no evidence for ovarian torsion.  IMPRESSION: No evidence for ovarian torsion.  **END ADDENDUM** SIGNED BY: Tonia Ghent, M.D.   11/15/2012  *RADIOLOGY REPORT*  Clinical Data: Vaginal discharge 2 months.  TRANSABDOMINAL AND TRANSVAGINAL ULTRASOUND OF PELVIS Technique:  Both transabdominal and transvaginal ultrasound examinations of the pelvis were performed. Transabdominal technique was performed for global imaging of the pelvis including uterus, ovaries, adnexal regions, and pelvic cul-de-sac.  It was necessary to proceed with endovaginal exam following the transabdominal exam to visualize the left ovary.  Comparison:  None  Findings:  Uterus: Normal in size, 9.1 x 3.7 x 6.8 cm.  Endometrium: Normal.  13 mm in thickness.  Right ovary:  There is a partially collapsed 2.5 cm cyst on the right ovary.  Ovary  measures 4.1 x 3.1 x 3.9 cm.  Left ovary: Normal.  3.2 x 81.2 x 2.9 cm.  Other findings: There is a trace amount of free fluid.  No evidence of adnexal mass or hydrosalpinx or pyosalpinx.  IMPRESSION: Partially collapsed  small cyst on the right ovary.  Otherwise, normal.  Original Report Authenticated By: Francene Boyers, M.D.    US Pelvis Complete  11/15/2012  **ADDENDUM** CREATED: 11/15/2012 21:30:52  There is no evidence of tubo-ovarian abscess.  The ovaries are unremarkable in appearance.  **END ADDENDUM** SIGNED BY: Tonia Ghent, M.D.   11/15/2012  **ADDENDUM** CREATED: 11/15/2012 21:26:49  *RADIOLOGY REPORT*  Clinical Data: Vaginal discharge for 2 months.  DOPPLER ULTRASOUND OF OVARIES  Technique:  Color and duplex Doppler ultrasound was utilized to evaluate blood flow to the ovaries.  Comparison:  Prior ultrasound of pregnancy performed 06/13/2012  Findings: Doppler evaluation demonstrates normal arterial and venous spectral waveforms with respect to both ovaries; there is no evidence for ovarian torsion.  IMPRESSION: No evidence for ovarian torsion.  **END ADDENDUM** SIGNED BY: Tonia Ghent, M.D.   11/15/2012  *RADIOLOGY REPORT*  Clinical Data: Vaginal discharge 2 months.  TRANSABDOMINAL AND TRANSVAGINAL ULTRASOUND OF PELVIS Technique:  Both transabdominal and transvaginal ultrasound examinations of the pelvis were performed. Transabdominal technique was performed for global imaging of the pelvis including uterus, ovaries, adnexal regions, and pelvic cul-de-sac.  It was necessary to proceed with endovaginal exam following the transabdominal exam to visualize the left ovary.  Comparison:  None  Findings:  Uterus: Normal in size, 9.1 x 3.7 x 6.8 cm.  Endometrium: Normal.  13 mm in thickness.  Right ovary:  There is a partially collapsed 2.5 cm cyst on the right ovary.  Ovary measures 4.1 x 3.1 x 3.9 cm.  Left ovary: Normal.  3.2 x 81.2 x 2.9 cm.  Other findings: There is a trace amount of free fluid.  No  evidence of adnexal mass or hydrosalpinx or pyosalpinx.  IMPRESSION: Partially collapsed  small cyst on the right ovary.  Otherwise, normal.  Original Report Authenticated By: Francene Boyers, M.D.      1. UTI (lower urinary tract infection)   2. PID (acute pelvic inflammatory disease)       MDM  Ultrasound negative for TOA  Will DC home with Keflex, Flagyl and Doxycycline FU with GYN         Arman Filter, NP 11/15/12 2156  Arman Filter, NP 11/15/12 2156

## 2012-11-15 NOTE — ED Provider Notes (Signed)
History     CSN: [id number]  Arrival date & time 11/15/12  1420   First MD Initiated Contact with Patient 11/15/12 1543      Chief Complaint  Patient presents with  . Urinary Tract Infection    (Consider location/radiation/quality/duration/timing/severity/associated sxs/prior treatment) HPI Comments: The patient comes in with cc of UTI. She states that for the past few days she has been having dysuria, lower quadrant abd pain, and vaginal discharge. She has no n/c/f/c. No hx of STD.  The history is provided by the patient.    Past Medical History  Diagnosis Date  . Acid reflux   . Anxiety     hx of anxiety attack    Past Surgical History  Procedure Date  . No past surgeries   . Wisdom tooth extraction     Family History  Problem Relation Age of Onset  . Other Neg Hx   . Asthma Sister   . Hypertension Brother   . Asthma Brother   . Diabetes Maternal Grandmother     History  Substance Use Topics  . Smoking status: Never Smoker   . Smokeless tobacco: Not on file  . Alcohol Use: No    OB History    Grav Para Term Preterm Abortions TAB SAB Ect Mult Living   1 1 1  0 0 0 0 0 0 1      Review of Systems  Constitutional: Negative for activity change.  HENT: Negative for facial swelling and neck pain.   Respiratory: Negative for cough, shortness of breath and wheezing.   Cardiovascular: Negative for chest pain.  Gastrointestinal: Negative for nausea, vomiting, abdominal pain, diarrhea, constipation, blood in stool and abdominal distention.  Genitourinary: Positive for dysuria and vaginal discharge. Negative for hematuria and difficulty urinating.  Skin: Negative for color change.  Neurological: Negative for speech difficulty.  Hematological: Does not bruise/bleed easily.  Psychiatric/Behavioral: Negative for confusion.    Allergies  Review of patient's allergies indicates no known allergies.  Home Medications  No current outpatient prescriptions on  file.  BP 113/52  Pulse 68  Temp 98.4 F (36.9 C) (Oral)  Resp 18  SpO2 98%  Breastfeeding? Unknown  Physical Exam  Nursing note and vitals reviewed. Constitutional: She is oriented to person, place, and time. She appears well-developed.  HENT:  Head: Normocephalic and atraumatic.  Eyes: Conjunctivae normal and EOM are normal. Pupils are equal, round, and reactive to light.  Neck: Normal range of motion. Neck supple.  Cardiovascular: Normal rate, regular rhythm, normal heart sounds and intact distal pulses.   No murmur heard. Pulmonary/Chest: Effort normal. No respiratory distress. She has no wheezes.  Abdominal: Soft. Bowel sounds are normal. She exhibits no distension. There is tenderness. There is no rebound and no guarding.  Genitourinary: Vagina normal and uterus normal.       External exam - normal, no lesions Speculum exam: Pt has profuse purulent discharge, no blood Bimanual exam: Patient has + CMT, no adnexal tenderness or fullness and cervical os is closed  Neurological: She is alert and oriented to person, place, and time.  Skin: Skin is warm and dry.    ED Course  Procedures (including critical care time)  Labs Reviewed  URINALYSIS, ROUTINE W REFLEX MICROSCOPIC - Abnormal; Notable for the following:    APPearance HAZY (*)     Specific Gravity, Urine 1.031 (*)     Leukocytes, UA MODERATE (*)     All other components within normal limits  WET  PREP, GENITAL - Abnormal; Notable for the following:    Clue Cells Wet Prep HPF POC FEW (*)     WBC, Wet Prep HPF POC MANY (*)     All other components within normal limits  URINE MICROSCOPIC-ADD ON - Abnormal; Notable for the following:    Squamous Epithelial / LPF FEW (*)     All other components within normal limits  POCT PREGNANCY, URINE  GC/CHLAMYDIA PROBE AMP   No results found.   No diagnosis found.    MDM  PT comes in with cc of dysuria and pelvic pain. GU exam shows that patient has PID, and i am  concerned that there might be even TOA.  Will get [place]. She will certainly be treated as PID if the [place] is negative. GC and Chlamydia meds provided.    Derwood Kaplan, MD 11/16/12 [phone]

## 2012-11-15 NOTE — ED Notes (Addendum)
Pt states she sometimes get "bumps" around genital area that are painful. Pt also c/o intermittent low abdominal pain. Denies pain at this time. MD notified.

## 2012-11-16 NOTE — ED Provider Notes (Signed)
Medical screening examination/treatment/procedure(s) were conducted as a shared visit with non-physician practitioner(s) and myself.  I personally evaluated the patient during the encounter  Derwood Kaplan, MD 11/16/12 1308

## 2012-11-17 LAB — GC/CHLAMYDIA PROBE AMP
CT Probe RNA: POSITIVE — AB
GC Probe RNA: POSITIVE — AB

## 2012-11-18 ENCOUNTER — Telehealth (HOSPITAL_COMMUNITY): Payer: Self-pay | Admitting: Emergency Medicine

## 2012-11-18 NOTE — ED Notes (Signed)
+  Gonorrhea. +Chlamydia. Patient treated with Rocephin and Zithromax. Per protocol MD. [facility] faxed.

## 2012-11-18 NOTE — ED Notes (Signed)
Patient notified of positive Gonorrhea and Chlamydia after ID verified x three. STD instructions given, patient verbalized understanding. Treated at time of ED visit.

## 2013-05-19 ENCOUNTER — Emergency Department (HOSPITAL_COMMUNITY)
Admission: EM | Admit: 2013-05-19 | Discharge: 2013-05-19 | Disposition: A | Discharge status: Home / Self Care | Payer: Medicaid Other | Attending: Emergency Medicine | Admitting: Emergency Medicine

## 2013-05-19 ENCOUNTER — Encounter (HOSPITAL_COMMUNITY): Payer: Self-pay | Admitting: *Deleted

## 2013-05-19 DIAGNOSIS — Z3202 Encounter for pregnancy test, result negative: Secondary | ICD-10-CM | POA: Insufficient documentation

## 2013-05-19 DIAGNOSIS — Z87440 Personal history of urinary (tract) infections: Secondary | ICD-10-CM | POA: Insufficient documentation

## 2013-05-19 DIAGNOSIS — Z8619 Personal history of other infectious and parasitic diseases: Secondary | ICD-10-CM | POA: Insufficient documentation

## 2013-05-19 DIAGNOSIS — K219 Gastro-esophageal reflux disease without esophagitis: Secondary | ICD-10-CM | POA: Insufficient documentation

## 2013-05-19 DIAGNOSIS — F411 Generalized anxiety disorder: Secondary | ICD-10-CM | POA: Insufficient documentation

## 2013-05-19 DIAGNOSIS — R3915 Urgency of urination: Secondary | ICD-10-CM | POA: Insufficient documentation

## 2013-05-19 DIAGNOSIS — R350 Frequency of micturition: Secondary | ICD-10-CM | POA: Insufficient documentation

## 2013-05-19 DIAGNOSIS — R109 Unspecified abdominal pain: Secondary | ICD-10-CM | POA: Insufficient documentation

## 2013-05-19 LAB — URINALYSIS, ROUTINE W REFLEX MICROSCOPIC
Glucose, UA: NEGATIVE mg/dL
Protein, ur: NEGATIVE mg/dL
pH: 5.5 (ref 5.0–8.0)

## 2013-05-19 LAB — URINE MICROSCOPIC-ADD ON

## 2013-05-19 LAB — POCT PREGNANCY, URINE: Preg Test, Ur: NEGATIVE

## 2013-05-19 LAB — WET PREP, GENITAL

## 2013-05-19 NOTE — ED Notes (Signed)
Reports lower abd pain for several days with urinary frequency. Denies pain with urination, denies any vaginal discharge or itching.

## 2013-05-19 NOTE — ED Provider Notes (Signed)
CSN: [id number]     Arrival date & time 05/19/13  1742 History     First MD Initiated Contact with Patient 05/19/13 1932     Chief Complaint  Patient presents with  . Abdominal Pain  . Urinary Frequency   (Consider location/radiation/quality/duration/timing/severity/associated sxs/prior Treatment) HPI [name] [name] 22 y.o. is for urinary frequency. She reports this started approximately 2-3 days ago. She believes it has been worsening. No known exacerbating or relieving factors. She reports going to the urinating within 10 times a day. She denies any incontinence. There is urgency. She denies hesitancy, dysuria, or gross hematuria. Of note she was recently seen and treated for UTI and Chlamydia and gonorrhea. She denies nausea, vomiting, abdominal pain, diarrhea, polyphasia, polydipsia. He continues to be sexually active. She also denies vaginal discharge, vaginal bleeding, or dyspareunia.  Past Medical History  Diagnosis Date  . Acid reflux   . Anxiety     hx of anxiety attack   Past Surgical History  Procedure Laterality Date  . No past surgeries    . Wisdom tooth extraction     Family History  Problem Relation Age of Onset  . Other Neg Hx   . Asthma Sister   . Hypertension Brother   . Asthma Brother   . Diabetes Maternal Grandmother    History  Substance Use Topics  . Smoking status: Never Smoker   . Smokeless tobacco: Not on file  . Alcohol Use: No   OB History   Grav Para Term Preterm Abortions TAB SAB Ect Mult Living   1 1 1  0 0 0 0 0 0 1     Review of Systems  Constitutional: Negative for fever, chills, diaphoresis, activity change and appetite change.  HENT: Negative for sore throat, rhinorrhea, sneezing, drooling and trouble swallowing.   Eyes: Negative for discharge and redness.  Respiratory: Negative for cough, chest tightness, shortness of breath, wheezing and stridor.   Cardiovascular: Negative for chest pain and leg swelling.  Gastrointestinal: Negative  for nausea, vomiting, abdominal pain, diarrhea, constipation and blood in stool.  Genitourinary: Positive for frequency. Negative for difficulty urinating.  Musculoskeletal: Negative for myalgias and arthralgias.  Skin: Negative for pallor.  Neurological: Negative for dizziness, syncope, speech difficulty, weakness, light-headedness and headaches.  Hematological: Negative for adenopathy. Does not bruise/bleed easily.  Psychiatric/Behavioral: Negative for confusion and agitation.    Allergies  Review of patient's allergies indicates no known allergies.  Home Medications   Current Outpatient Rx  Name  Route  Sig  Dispense  Refill  . omeprazole (PRILOSEC) 20 MG capsule   Oral   Take 20 mg by mouth daily.          BP 98/68  Pulse 59  Temp(Src) 98 F (36.7 C) (Oral)  Resp 16  SpO2 98%  LMP 05/05/2013 Physical Exam  Constitutional: She is oriented to person, place, and time. She appears well-developed and well-nourished. No distress.  HENT:  Head: Normocephalic and atraumatic.  Right Ear: External ear normal.  Left Ear: External ear normal.  Eyes: Conjunctivae and EOM are normal. Right eye exhibits no discharge. Left eye exhibits no discharge.  Neck: Normal range of motion. Neck supple. No JVD present.  Cardiovascular: Normal rate, regular rhythm and normal heart sounds.  Exam reveals no gallop and no friction rub.   No murmur heard. Pulmonary/Chest: Effort normal and breath sounds normal. No stridor. No respiratory distress. She has no wheezes. She has no rales. She exhibits no tenderness.  Abdominal:  Soft. Bowel sounds are normal. She exhibits no distension. There is no hepatosplenomegaly. There is tenderness in the suprapubic area. There is no rigidity, no rebound, no guarding, no CVA tenderness, no tenderness at McBurney's point and negative Murphy's sign.  Musculoskeletal: Normal range of motion. She exhibits no edema.  Neurological: She is alert and oriented to person,  place, and time.  Skin: Skin is warm. No rash noted. She is not diaphoretic.  Psychiatric: She has a normal mood and affect. Her behavior is normal.    ED Course   Procedures (including critical care time)  Labs Reviewed  WET PREP, GENITAL - Abnormal; Notable for the following:    WBC, Wet Prep HPF POC FEW (*)    All other components within normal limits  URINALYSIS, ROUTINE W REFLEX MICROSCOPIC - Abnormal; Notable for the following:    APPearance CLOUDY (*)    Leukocytes, UA MODERATE (*)    All other components within normal limits  URINE MICROSCOPIC-ADD ON - Abnormal; Notable for the following:    Squamous Epithelial / LPF MANY (*)    Bacteria, UA FEW (*)    All other components within normal limits  GLUCOSE, CAPILLARY - Abnormal; Notable for the following:    Glucose-Capillary 61 (*)    All other components within normal limits  URINE CULTURE  GC/CHLAMYDIA PROBE AMP  POCT PREGNANCY, URINE   Results for orders placed during the hospital encounter of 05/19/13  WET PREP, GENITAL      Result Value Range   Yeast Wet Prep HPF POC NONE SEEN  NONE SEEN   Trich, Wet Prep NONE SEEN  NONE SEEN   Clue Cells Wet Prep HPF POC NONE SEEN  NONE SEEN   WBC, Wet Prep HPF POC FEW (*) NONE SEEN  URINALYSIS, ROUTINE W REFLEX MICROSCOPIC      Result Value Range   Color, Urine YELLOW  YELLOW   APPearance CLOUDY (*) CLEAR   Specific Gravity, Urine 1.023  1.005 - 1.030   pH 5.5  5.0 - 8.0   Glucose, UA NEGATIVE  NEGATIVE mg/dL   Hgb urine dipstick NEGATIVE  NEGATIVE   Bilirubin Urine NEGATIVE  NEGATIVE   Ketones, ur NEGATIVE  NEGATIVE mg/dL   Protein, ur NEGATIVE  NEGATIVE mg/dL   Urobilinogen, UA 0.2  0.0 - 1.0 mg/dL   Nitrite NEGATIVE  NEGATIVE   Leukocytes, UA MODERATE (*) NEGATIVE  URINE MICROSCOPIC-ADD ON      Result Value Range   Squamous Epithelial / LPF MANY (*) RARE   WBC, UA 7-10  <3 WBC/hpf   RBC / HPF 0-2  <3 RBC/hpf   Bacteria, UA FEW (*) RARE   Urine-Other MUCOUS PRESENT     GLUCOSE, CAPILLARY      Result Value Range   Glucose-Capillary 61 (*) 70 - 99 mg/dL  POCT PREGNANCY, URINE      Result Value Range   Preg Test, Ur NEGATIVE  NEGATIVE    No results found. 1. Urinary frequency     MDM  [name] [name] 22 y.o. who was recently treated for a UTI and STDs. She presents for urinary frequency. No polyphasia or polydipsia. No lightheadedness. No abdominal pain. No fevers. Afebrile vital signs are stable here today. Point of care blood glucose 61. No signs or symptoms of peritonitis. Patient refused speculum and pelvic exam.   UA negative for UTI. Patient is not pregnant. Wet prep and GC chlamydia obtained. Wet prep negative for trichomonas. Will not treat preemptively  for GC chlamydia. Instructed the patient to followup as obscured the PCP if her symptoms continue. Is also instructed to followup on her lab results she may need to be treated. She was given strong term cautions.  Labs reviewed. I discussed patient's care at my attending, Dr. Rhunette Croft.   Sena Hitch, MD 05/19/13 2318  I performed a history and physical examination of  [name] [name] and discussed her management with Dr. Malen Gauze. I agree with the history, physical, assessment, and plan of care, with the following exceptions: None I was present for the following procedures: None  Time Spent in Critical Care of the patient: None  Time spent in discussions with the patient and family: 5 minutes  [name] [name]   Derwood Kaplan, MD 05/21/13 1554

## 2013-05-20 LAB — GC/CHLAMYDIA PROBE AMP: GC Probe RNA: NEGATIVE

## 2013-05-21 LAB — URINE CULTURE

## 2013-07-13 ENCOUNTER — Emergency Department (HOSPITAL_COMMUNITY)
Admission: EM | Admit: 2013-07-13 | Discharge: 2013-07-13 | Disposition: A | Discharge status: Home / Self Care | Payer: Medicaid Other | Attending: Emergency Medicine | Admitting: Emergency Medicine

## 2013-07-13 ENCOUNTER — Emergency Department (HOSPITAL_COMMUNITY): Payer: Medicaid Other

## 2013-07-13 ENCOUNTER — Encounter (HOSPITAL_COMMUNITY): Payer: Self-pay | Admitting: Emergency Medicine

## 2013-07-13 DIAGNOSIS — Z8719 Personal history of other diseases of the digestive system: Secondary | ICD-10-CM | POA: Insufficient documentation

## 2013-07-13 DIAGNOSIS — O9989 Other specified diseases and conditions complicating pregnancy, childbirth and the puerperium: Secondary | ICD-10-CM | POA: Insufficient documentation

## 2013-07-13 DIAGNOSIS — R110 Nausea: Secondary | ICD-10-CM | POA: Insufficient documentation

## 2013-07-13 DIAGNOSIS — Z8659 Personal history of other mental and behavioral disorders: Secondary | ICD-10-CM | POA: Insufficient documentation

## 2013-07-13 DIAGNOSIS — R109 Unspecified abdominal pain: Secondary | ICD-10-CM

## 2013-07-13 DIAGNOSIS — R51 Headache: Secondary | ICD-10-CM | POA: Insufficient documentation

## 2013-07-13 DIAGNOSIS — R1033 Periumbilical pain: Secondary | ICD-10-CM | POA: Insufficient documentation

## 2013-07-13 DIAGNOSIS — Z3490 Encounter for supervision of normal pregnancy, unspecified, unspecified trimester: Secondary | ICD-10-CM

## 2013-07-13 LAB — CBC WITH DIFFERENTIAL/PLATELET
Basophils Relative: 0 % (ref 0–1)
Eosinophils Absolute: 0 10*3/uL (ref 0.0–0.7)
Hemoglobin: 10.9 g/dL — ABNORMAL LOW (ref 12.0–15.0)
MCH: 27.9 pg (ref 26.0–34.0)
MCHC: 33.7 g/dL (ref 30.0–36.0)
Monocytes Relative: 6 % (ref 3–12)
Neutrophils Relative %: 51 % (ref 43–77)

## 2013-07-13 LAB — BASIC METABOLIC PANEL
BUN: 5 mg/dL — ABNORMAL LOW (ref 6–23)
Creatinine, Ser: 0.54 mg/dL (ref 0.50–1.10)
GFR calc Af Amer: >90 mL/min (ref >90)
GFR calc non Af Amer: >90 mL/min (ref >90)
Potassium: 3.6 mEq/L (ref 3.5–5.1)

## 2013-07-13 LAB — URINE MICROSCOPIC-ADD ON

## 2013-07-13 LAB — URINALYSIS, ROUTINE W REFLEX MICROSCOPIC
Bilirubin Urine: NEGATIVE
Nitrite: NEGATIVE
Specific Gravity, Urine: 1.012 (ref 1.005–1.030)
pH: 7 (ref 5.0–8.0)

## 2013-07-13 LAB — WET PREP, GENITAL

## 2013-07-13 LAB — PREGNANCY, URINE: Preg Test, Ur: POSITIVE — AB

## 2013-07-13 LAB — ABO/RH: ABO/RH(D): A POS

## 2013-07-13 LAB — HCG, QUANTITATIVE, PREGNANCY: hCG, Beta Chain, Quant, S: 109832 m[IU]/mL — ABNORMAL HIGH (ref <5)

## 2013-07-13 NOTE — ED Notes (Signed)
MD at bedside. 

## 2013-07-13 NOTE — ED Notes (Signed)
PA at bedside.

## 2013-07-13 NOTE — ED Provider Notes (Signed)
Complains of available pain for one month and vague frontal headache for one month. She recently took at home pregnancy test which is positive. Last normal menstrual period July 2014.  Doug Sou, MD 07/13/13 1313

## 2013-07-13 NOTE — ED Notes (Signed)
Patient states she is here for a HA that she had this morning but is gone now and stomach ache. She states she is pregnant but does not know how far along. Has not been to the Dr. Yet. LMP July.

## 2013-07-13 NOTE — ED Provider Notes (Signed)
CSN: [id number]     Arrival date & time 07/13/13  0855 History   First MD Initiated Contact with Patient 07/13/13 1113     Chief Complaint  Patient presents with  . Headache  . Abdominal Pain   (Consider location/radiation/quality/duration/timing/severity/associated sxs/prior Treatment) Patient is a 22 y.o. female presenting with headaches and abdominal pain. The history is provided by the patient. No language interpreter was used.  Headache Pain location:  Frontal Radiates to:  Does not radiate Associated symptoms: abdominal pain and nausea   Associated symptoms: no back pain, no congestion, no cough, no diarrhea, no fever, no sinus pressure, no vomiting and no weakness   Associated symptoms comment:  Headache everyday for one month. No change in symptoms. She just wanted to get it check out. Abdominal pain:    Location:  Periumbilical   Quality:  Aching   Severity:  Mild   Duration:  1 month   Progression: Patient vague symptoms of abdominal discomfort for one month, no better/worse. She reports it occurs at night, but not every night.  Abdominal Pain Associated symptoms: nausea   Associated symptoms: no constipation, no cough, no diarrhea, no dysuria, no fever, no shortness of breath, no vaginal bleeding, no vaginal discharge and no vomiting     Past Medical History  Diagnosis Date  . Acid reflux   . Anxiety     hx of anxiety attack   Past Surgical History  Procedure Laterality Date  . No past surgeries    . Wisdom tooth extraction     Family History  Problem Relation Age of Onset  . Other Neg Hx   . Asthma Sister   . Hypertension Brother   . Asthma Brother   . Diabetes Maternal Grandmother    History  Substance Use Topics  . Smoking status: Never Smoker   . Smokeless tobacco: Not on file  . Alcohol Use: No   OB History   Grav Para Term Preterm Abortions TAB SAB Ect Mult Living   2 1 1  0 0 0 0 0 0 1     Review of Systems  Constitutional: Negative for  fever.  HENT: Negative for congestion and sinus pressure.   Respiratory: Negative for cough and shortness of breath.   Gastrointestinal: Positive for nausea and abdominal pain. Negative for vomiting, diarrhea and constipation.  Genitourinary: Negative for dysuria, frequency, flank pain, vaginal bleeding and vaginal discharge.  Musculoskeletal: Negative for back pain.  Neurological: Positive for headaches.    Allergies  Review of patient's allergies indicates no known allergies.  Home Medications  No current outpatient prescriptions on file. BP 118/69  Pulse 72  Temp(Src) 98.5 F (36.9 C) (Oral)  Resp 18  Ht 5\' 5"  (1.651 m)  SpO2 96%  LMP 05/08/2013 Physical Exam  Constitutional: She is oriented to person, place, and time. She appears well-developed and well-nourished.  HENT:  Head: Normocephalic.  Neck: Normal range of motion. Neck supple.  Cardiovascular: Normal rate and regular rhythm.   Pulmonary/Chest: Effort normal and breath sounds normal.  Abdominal: Soft. Bowel sounds are normal. There is tenderness. There is no rebound and no guarding.  Diffusely tender that is slightly worse at periumbilical area.  Genitourinary: Vagina normal. No vaginal discharge found.  Os is unremarkable in appearance, no redness, discharge or bleeding. Non-tender. No adnexal tenderness or mass.   Musculoskeletal: Normal range of motion.  Neurological: She is alert and oriented to person, place, and time.  Skin: Skin is warm and dry.  No rash noted.  Psychiatric: She has a normal mood and affect.    ED Course  Procedures (including critical care time) Labs Review Labs Reviewed - No data to display Imaging Review No results found. Results for orders placed during the hospital encounter of 07/13/13  WET PREP, GENITAL      Result Value Range   Yeast Wet Prep HPF POC NONE SEEN  NONE SEEN   Trich, Wet Prep NONE SEEN  NONE SEEN   Clue Cells Wet Prep HPF POC FEW (*) NONE SEEN   WBC, Wet Prep  HPF POC FEW (*) NONE SEEN  GC/CHLAMYDIA PROBE AMP      Result Value Range   CT Probe RNA NEGATIVE  NEGATIVE   GC Probe RNA NEGATIVE  NEGATIVE  URINALYSIS, ROUTINE W REFLEX MICROSCOPIC      Result Value Range   Color, Urine YELLOW  YELLOW   APPearance CLEAR  CLEAR   Specific Gravity, Urine 1.012  1.005 - 1.030   pH 7.0  5.0 - 8.0   Glucose, UA NEGATIVE  NEGATIVE mg/dL   Hgb urine dipstick NEGATIVE  NEGATIVE   Bilirubin Urine NEGATIVE  NEGATIVE   Ketones, ur NEGATIVE  NEGATIVE mg/dL   Protein, ur NEGATIVE  NEGATIVE mg/dL   Urobilinogen, UA 0.2  0.0 - 1.0 mg/dL   Nitrite NEGATIVE  NEGATIVE   Leukocytes, UA SMALL (*) NEGATIVE  PREGNANCY, URINE      Result Value Range   Preg Test, Ur POSITIVE (*) NEGATIVE  URINE MICROSCOPIC-ADD ON      Result Value Range   Squamous Epithelial / LPF RARE  RARE   WBC, UA 0-2  <3 WBC/hpf   Bacteria, UA FEW (*) RARE  HCG, QUANTITATIVE, PREGNANCY      Result Value Range   hCG, Beta Francene Finders [id number] (*) <5 mIU/mL  CBC WITH DIFFERENTIAL      Result Value Range   WBC 6.2  4.0 - 10.5 K/uL   RBC 3.90  3.87 - 5.11 MIL/uL   Hemoglobin 10.9 (*) 12.0 - 15.0 g/dL   HCT 08.6 (*) 57.8 - 46.9 %   MCV 82.8  78.0 - 100.0 fL   MCH 27.9  26.0 - 34.0 pg   MCHC 33.7  30.0 - 36.0 g/dL   RDW 62.9  52.8 - 41.3 %   Platelets 381  150 - 400 K/uL   Neutrophils Relative % 51  43 - 77 %   Neutro Abs 3.1  1.7 - 7.7 K/uL   Lymphocytes Relative 42  12 - 46 %   Lymphs Abs 2.6  0.7 - 4.0 K/uL   Monocytes Relative 6  3 - 12 %   Monocytes Absolute 0.4  0.1 - 1.0 K/uL   Eosinophils Relative 0  0 - 5 %   Eosinophils Absolute 0.0  0.0 - 0.7 K/uL   Basophils Relative 0  0 - 1 %   Basophils Absolute 0.0  0.0 - 0.1 K/uL  BASIC METABOLIC PANEL      Result Value Range   Sodium 131 (*) 135 - 145 mEq/L   Potassium 3.6  3.5 - 5.1 mEq/L   Chloride 98  96 - 112 mEq/L   CO2 23  19 - 32 mEq/L   Glucose, Bld 75  70 - 99 mg/dL   BUN 5 (*) 6 - 23 mg/dL   Creatinine, Ser 2.44   0.50 - 1.10 mg/dL   Calcium 9.3  8.4 - 01.0 mg/dL  GFR calc non Af Amer >90  >90 mL/min   GFR calc Af Amer >90  >90 mL/min  ABO/RH      Result Value Range   ABO/RH(D) A POS     No rh immune globuloin NOT A RH IMMUNE GLOBULIN CANDIDATE, PT RH POSITIVE     US Ob Comp Less 14 Wks  07/13/2013   CLINICAL DATA:  Abdominal pain.  EXAM: TRANSVAGINAL OB ULTRASOUND; OBSTETRIC <14 WK ULTRASOUND  TECHNIQUE: Transvaginal ultrasound was performed for complete evaluation of the gestation as well as the maternal uterus, adnexal regions, and pelvic cul-de-sac.  COMPARISON:  11/15/2012  FINDINGS: Intrauterine gestational sac: Visualized/normal in shape.  Yolk sac:  Present  Embryo:  Present  Cardiac Activity: Present  Heart Rate: 185 bpm  CRL:   42  mm   11 w 0 d                  [place] EDC: 02/01/2014  Maternal uterus/adnexae:  Subchorionic hemorrhage: None  Right ovary: Normal  Left ovary: Corpus luteal cyst is visualized measuring 1.8 x 1.4 x 1.4 cm.  Other :None  Free fluid: None  IMPRESSION: 1. Single living intrauterine gestation with an estimated gestational age of [redacted] weeks and 0 days 2. No complications identified.   Electronically Signed   By: Signa Kell M.D.   On: 07/13/2013 14:19   US Ob Transvaginal  07/13/2013   CLINICAL DATA:  Abdominal pain.  EXAM: TRANSVAGINAL OB ULTRASOUND; OBSTETRIC <14 WK ULTRASOUND  TECHNIQUE: Transvaginal ultrasound was performed for complete evaluation of the gestation as well as the maternal uterus, adnexal regions, and pelvic cul-de-sac.  COMPARISON:  11/15/2012  FINDINGS: Intrauterine gestational sac: Visualized/normal in shape.  Yolk sac:  Present  Embryo:  Present  Cardiac Activity: Present  Heart Rate: 185 bpm  CRL:   42  mm   11 w 0 d                  [place] EDC: 02/01/2014  Maternal uterus/adnexae:  Subchorionic hemorrhage: None  Right ovary: Normal  Left ovary: Corpus luteal cyst is visualized measuring 1.8 x 1.4 x 1.4 cm.  Other :None  Free fluid: None  IMPRESSION: 1. Single  living intrauterine gestation with an estimated gestational age of [redacted] weeks and 0 days 2. No complications identified.   Electronically Signed   By: Signa Kell M.D.   On: 07/13/2013 14:19    MDM  No diagnosis found. 1. IUP, [redacted]w[redacted]d 2. Abdominal pain 3. Headache  Patient is stable, remains in NAD for duration of ED visit. IUP by ultrasound, normal labs. Non-focal neurologic exam - doubt headache is from acute neurologic event, suspect mild common headache. Encouraged Tylenol    Arnoldo Hooker, PA-C 07/18/13 1422

## 2013-07-13 NOTE — ED Notes (Signed)
Pt c/o mid abdominal pain and headache x 1 month. Pt reports nausea. Pt reports found out she is pregnant last month by home pregnancy test. Pt has not had any prenatal care

## 2013-07-15 LAB — GC/CHLAMYDIA PROBE AMP: CT Probe RNA: NEGATIVE

## 2013-07-23 NOTE — ED Provider Notes (Signed)
Medical screening examination/treatment/procedure(s) were conducted as a shared visit with non-physician practitioner(s) and myself.  I personally evaluated the patient during the encounter  Doug Sou, MD 07/23/13 [phone]

## 2013-08-20 ENCOUNTER — Other Ambulatory Visit (HOSPITAL_COMMUNITY)
Admission: RE | Admit: 2013-08-20 | Discharge: 2013-08-20 | Disposition: A | Discharge status: Home / Self Care | Payer: Medicaid Other | Source: Ambulatory Visit | Attending: Advanced Practice Midwife | Admitting: Advanced Practice Midwife

## 2013-08-20 ENCOUNTER — Encounter: Payer: Self-pay | Admitting: Advanced Practice Midwife

## 2013-08-20 ENCOUNTER — Ambulatory Visit (INDEPENDENT_AMBULATORY_CARE_PROVIDER_SITE_OTHER): Payer: Medicaid Other | Admitting: Advanced Practice Midwife

## 2013-08-20 VITALS — BP 102/66 | Wt 152.1 lb

## 2013-08-20 DIAGNOSIS — R350 Frequency of micturition: Secondary | ICD-10-CM

## 2013-08-20 DIAGNOSIS — O09299 Supervision of pregnancy with other poor reproductive or obstetric history, unspecified trimester: Secondary | ICD-10-CM | POA: Insufficient documentation

## 2013-08-20 DIAGNOSIS — O09292 Supervision of pregnancy with other poor reproductive or obstetric history, second trimester: Secondary | ICD-10-CM

## 2013-08-20 DIAGNOSIS — F819 Developmental disorder of scholastic skills, unspecified: Secondary | ICD-10-CM | POA: Insufficient documentation

## 2013-08-20 DIAGNOSIS — Z01419 Encounter for gynecological examination (general) (routine) without abnormal findings: Secondary | ICD-10-CM | POA: Insufficient documentation

## 2013-08-20 DIAGNOSIS — Z23 Encounter for immunization: Secondary | ICD-10-CM

## 2013-08-20 DIAGNOSIS — F8189 Other developmental disorders of scholastic skills: Secondary | ICD-10-CM

## 2013-08-20 DIAGNOSIS — Z113 Encounter for screening for infections with a predominantly sexual mode of transmission: Secondary | ICD-10-CM | POA: Insufficient documentation

## 2013-08-20 LAB — POCT URINALYSIS DIP (DEVICE)
Nitrite: NEGATIVE
Specific Gravity, Urine: 1.025 (ref 1.005–1.030)
Urobilinogen, UA: 2 mg/dL — ABNORMAL HIGH (ref 0.0–1.0)
pH: 7 (ref 5.0–8.0)

## 2013-08-20 NOTE — Progress Notes (Signed)
New OB.  See other note.  Pap done. Closely Spaced pregnancy   Subjective:    [name] [name] is a G2P1001 [redacted]w[redacted]d being seen today for her first obstetrical visit.  Her obstetrical history is significant for Closely spaced pregnancy. Patient does not intend to breast feed. Pregnancy history fully reviewed.  Patient reports no bleeding, no contractions, no cramping and no leaking.  Filed Vitals:   08/20/13 1450  BP: 102/66  Weight: 152 lb 1.6 oz (68.992 kg)    HISTORY: OB History  Gravida Para Term Preterm AB SAB TAB Ectopic Multiple Living  2 1 1  0 0 0 0 0 0 1    # Outcome Date GA Lbr Len/2nd Weight Sex Delivery Anes PTL Lv  2 CUR           1 TRM 08/22/12 [redacted]w[redacted]d 08:03 / 00:08 6 lb 11.9 oz (3.06 kg) M SVD EPI  Y     Past Medical History  Diagnosis Date  . Acid reflux   . Anxiety     hx of anxiety attack   Past Surgical History  Procedure Laterality Date  . No past surgeries    . Wisdom tooth extraction     Family History  Problem Relation Age of Onset  . Other Neg Hx   . Asthma Sister   . Hypertension Brother   . Asthma Brother   . Diabetes Maternal Grandmother      Exam    Uterus:  Fundal Height: 16 cm  Pelvic Exam:    Perineum: No Hemorrhoids, Normal Perineum   Vulva: Bartholin's, Urethra, Skene's normal   Vagina:  normal mucosa, normal discharge   pH:    Cervix: multiparous appearance, no cervical motion tenderness and no lesions   Adnexa: no mass, fullness, tenderness   Bony Pelvis: gynecoid  System: Breast:  normal appearance, no masses or tenderness   Skin: normal coloration and turgor, no rashes    Neurologic: oriented, normal, grossly non-focal   Extremities: normal strength, tone, and muscle mass   HEENT neck supple with midline trachea   Mouth/Teeth mucous membranes moist, pharynx normal without lesions   Neck supple and no masses   Cardiovascular: regular rate and rhythm   Respiratory:  appears well, vitals normal, no respiratory distress,  acyanotic, normal RR, ear and throat exam is normal, neck free of mass or lymphadenopathy, chest clear, no wheezing, crepitations, rhonchi, normal symmetric air entry   Abdomen: soft, non-tender; bowel sounds normal; no masses,  no organomegaly   Urinary: bladder not palpable      Assessment:    Pregnancy: G2P1001 Patient Active Problem List   Diagnosis Date Noted  . Hx of preeclampsia, prior pregnancy, currently pregnant 08/20/2013  . Learning disability 08/20/2013  . Urinary frequency 05/19/2013  . Pre-eclampsia, postpartum 08/27/2012        Plan:     Initial labs drawn. Prenatal vitamins. Problem list reviewed and updated. Genetic Screening discussed Quad Screen: ordered.  Ultrasound discussed; fetal survey: requested.  Plan anatomy detail [place] at 20 weeks  Follow up in 4 weeks. 50% of 30 min visit spent on counseling and coordination of care.     [facility] 08/20/2013

## 2013-08-20 NOTE — Progress Notes (Signed)
Pulse: 86 Needs Pap Smear, Never had one before.

## 2013-08-20 NOTE — Patient Instructions (Signed)
Pregnancy - Second Trimester The second trimester is the period between 13 to 27 weeks of your pregnancy. It is important to follow your doctor's instructions. HOME CARE   Do not smoke.  Do not drink alcohol or use drugs.  Only take medicine as told by your doctor.  Take prenatal vitamins as told. The vitamin should contain 1 milligram of folic acid.  Exercise.  Eat healthy foods. Eat regular, well-balanced meals.  You can have sex (intercourse) if there are no other problems with the pregnancy.  Do not use hot tubs, steam rooms, or saunas.  Wear a seat belt while driving.  Avoid raw meat, uncooked cheese, and litter boxes and soil used by cats.  Visit your dentist. Cleanings are okay. GET HELP RIGHT AWAY IF:   You have a temperature by mouth above 102 F (38.9 C), not controlled by medicine.  Fluid is coming from your vagina.  Blood is coming from your vagina. Light spotting is common, especially after sex (intercourse).  You have a bad smelling fluid (discharge) coming from the vagina. The fluid changes from clear to white.  You still feel sick to your stomach (nauseous).  You throw up (vomit) blood.  You lose or gain more than 2 pounds (0.9 kilograms) of weight in a week, or as suggested by your doctor.  Your face, hands, feet, or legs get puffy (swell).  You get exposed to German measles and have never had them.  You get exposed to fifth disease or chickenpox.  You have belly (abdominal) pain.  You have a bad headache that will not go away.  You have watery poop (diarrhea), pain when you pee (urinate), or have shortness of breath.  You start to have problems seeing (blurry or double vision).  You fall, are in a car accident, or have any kind of trauma.  There is mental or physical violence at home.  You have any concerns or worries during your pregnancy. MAKE SURE YOU:   Understand these instructions.  Will watch your condition.  Will get help  right away if you are not doing well or get worse. Document Released: 01/05/2010 Document Revised: 01/03/2012 Document Reviewed: 01/05/2010 [employer] Patient Information 2014 [employer].  

## 2013-08-21 ENCOUNTER — Other Ambulatory Visit: Payer: Self-pay | Admitting: Advanced Practice Midwife

## 2013-08-21 DIAGNOSIS — N390 Urinary tract infection, site not specified: Secondary | ICD-10-CM | POA: Insufficient documentation

## 2013-08-21 LAB — OBSTETRIC PANEL
Basophils Absolute: 0 10*3/uL (ref 0.0–0.1)
Basophils Relative: 0 % (ref 0–1)
Hemoglobin: 10.2 g/dL — ABNORMAL LOW (ref 12.0–15.0)
Hepatitis B Surface Ag: NEGATIVE
MCHC: 33.7 g/dL (ref 30.0–36.0)
Neutro Abs: 3.9 10*3/uL (ref 1.7–7.7)
Neutrophils Relative %: 63 % (ref 43–77)
Platelets: 311 10*3/uL (ref 150–400)
RDW: 15.6 % — ABNORMAL HIGH (ref 11.5–15.5)

## 2013-08-21 LAB — PRESCRIPTION MONITORING PROFILE (19 PANEL)
Barbiturate Screen, Urine: NEGATIVE ng/mL
Benzodiazepine Screen, Urine: NEGATIVE ng/mL
Buprenorphine, Urine: NEGATIVE ng/mL
Cocaine Metabolites: NEGATIVE ng/mL
MDMA URINE: NEGATIVE ng/mL
Meperidine, Ur: NEGATIVE ng/mL
Methaqualone: NEGATIVE ng/mL
Nitrites, Initial: NEGATIVE ug/mL
Opiate Screen, Urine: NEGATIVE ng/mL
Propoxyphene: NEGATIVE ng/mL
Zolpidem, Urine: NEGATIVE ng/mL

## 2013-08-21 LAB — ALCOHOL METABOLITE (ETG), URINE: Ethyl Glucuronide (EtG): NEGATIVE ng/mL

## 2013-08-21 MED ORDER — CEPHALEXIN 500 MG PO CAPS: 500.0000 mg | ORAL_CAPSULE | Freq: Four times a day (QID) | ORAL | Status: DC

## 2013-08-22 ENCOUNTER — Encounter: Payer: Self-pay | Admitting: General Practice

## 2013-08-22 ENCOUNTER — Telehealth: Payer: Self-pay | Admitting: *Deleted

## 2013-08-22 ENCOUNTER — Encounter: Payer: Self-pay | Admitting: *Deleted

## 2013-08-22 ENCOUNTER — Encounter: Payer: Self-pay | Admitting: Advanced Practice Midwife

## 2013-08-22 LAB — AFP, QUAD SCREEN
Curr Gest Age: 16.3 wks.days
Down Syndrome Scr Risk Est: 1:15100 {titer}
HCG, Total: 26335 m[IU]/mL
INH: 144.4 pg/mL
Interpretation-AFP: NEGATIVE
MoM for INH: 0.79
MoM for hCG: 1.07
Open Spina bifida: NEGATIVE
Osb Risk: <1:54600 {titer}
Tri 18 Scr Risk Est: NEGATIVE
uE3 Mom: 1.44
uE3 Value: 0.8 ng/mL

## 2013-08-22 LAB — CULTURE, OB URINE

## 2013-08-22 LAB — HEMOGLOBINOPATHY EVALUATION
Hemoglobin Other: 0 %
Hgb A2 Quant: 2.9 % (ref 2.2–3.2)
Hgb F Quant: 0 % (ref 0.0–2.0)

## 2013-08-22 MED ORDER — CEPHALEXIN 500 MG PO CAPS: 500.0000 mg | ORAL_CAPSULE | Freq: Four times a day (QID) | ORAL | Status: AC

## 2013-08-22 NOTE — Telephone Encounter (Signed)
Message copied by Mannie Stabile on Wed Aug 22, 2013 11:22 AM ------      Message from: [place], [place]      Created: Mon Aug 20, 2013  3:50 PM      Regarding: needs to come back for quad screen       Thought she was only 14 weeks to day at [facility]            Did not do her Quad Screen.            Will be too late when she comes back            Can we bring her back for lab visit to do it?            I will put order in. ------

## 2013-08-22 NOTE — Telephone Encounter (Signed)
Rx sent to pharmacy on file. Called patient and left message for her to return our call.

## 2013-08-22 NOTE — Telephone Encounter (Signed)
Called patient and message stated this number has been changed or disconnected. Will send patient letter. Made note for patient to get quad at next visit.

## 2013-08-22 NOTE — Telephone Encounter (Signed)
Message copied by Mannie Stabile on Wed Aug 22, 2013 11:22 AM ------      Message from: Aviva Signs      Created: Tue Aug 21, 2013  6:36 PM      Regarding: UTI treatment       Has Proteus UTI            I put in Keflex but it says willnot be eprescribed            Can you notify pt and call it in?            Thanks ------

## 2013-08-23 ENCOUNTER — Encounter: Payer: Self-pay | Admitting: *Deleted

## 2013-09-18 ENCOUNTER — Encounter: Payer: Medicaid Other | Admitting: Obstetrics and Gynecology

## 2013-09-27 ENCOUNTER — Encounter: Payer: Self-pay | Admitting: *Deleted

## 2013-10-03 ENCOUNTER — Ambulatory Visit (INDEPENDENT_AMBULATORY_CARE_PROVIDER_SITE_OTHER): Payer: Medicaid Other | Admitting: Obstetrics and Gynecology

## 2013-10-03 ENCOUNTER — Encounter: Payer: Self-pay | Admitting: Obstetrics and Gynecology

## 2013-10-03 VITALS — BP 109/60 | Wt 162.7 lb

## 2013-10-03 DIAGNOSIS — O09892 Supervision of other high risk pregnancies, second trimester: Secondary | ICD-10-CM

## 2013-10-03 DIAGNOSIS — N390 Urinary tract infection, site not specified: Secondary | ICD-10-CM

## 2013-10-03 DIAGNOSIS — O09899 Supervision of other high risk pregnancies, unspecified trimester: Secondary | ICD-10-CM

## 2013-10-03 DIAGNOSIS — O2390 Unspecified genitourinary tract infection in pregnancy, unspecified trimester: Secondary | ICD-10-CM

## 2013-10-03 MED ORDER — CONCEPT OB 130-92.4-1 MG PO CAPS: 1.0000 | ORAL_CAPSULE | ORAL | Status: DC

## 2013-10-03 NOTE — Progress Notes (Signed)
Needs anatomy scan. Doing well. Results reviewed. PNV rx given.

## 2013-10-03 NOTE — Patient Instructions (Signed)
° °  Iron-Rich Diet ° °An iron-rich diet contains foods that are good sources of iron. Iron is an important mineral that helps your body produce hemoglobin. Hemoglobin is a protein in red blood cells that carries oxygen to the body's tissues. Sometimes, the iron level in your blood can be low. This may be caused by: °· A lack of iron in your diet. °· Blood loss. °· Times of growth, such as during pregnancy or during a child's growth and development. °Low levels of iron can cause a decrease in the number of red blood cells. This can result in iron deficiency anemia. Iron deficiency anemia symptoms include: °· Tiredness. °· Weakness. °· Irritability. °· Increased chance of infection. °Here are some recommendations for daily iron intake: °· Males older than 21 years of age need 8 mg of iron per day. °· Women ages 19 to 50 need 18 mg of iron per day. °· Pregnant women need 27 mg of iron per day, and women who are over 19 years of age and breastfeeding need 9 mg of iron per day. °· Women over the age of 50 need 8 mg of iron per day. °SOURCES OF IRON °There are 2 types of iron that are found in food: heme iron and nonheme iron. Heme iron is absorbed by the body better than nonheme iron. Heme iron is found in meat, poultry, and fish. Nonheme iron is found in grains, beans, and vegetables. °Heme Iron Sources °Food / Iron (mg) °· Chicken liver, 3 oz (85 g)/ 10 mg °· Beef liver, 3 oz (85 g)/ 5.5 mg °· Oysters, 3 oz (85 g)/ 8 mg °· Beef, 3 oz (85 g)/ 2 to 3 mg °· Shrimp, 3 oz (85 g)/ 2.8 mg °· Turkey, 3 oz (85 g)/ 2 mg °· Chicken, 3 oz (85 g) / 1 mg °· Fish (tuna, halibut), 3 oz (85 g)/ 1 mg °· Pork, 3 oz (85 g)/ 0.9 mg °Nonheme Iron Sources °Food / Iron (mg) °· Ready-to-eat breakfast cereal, iron-fortified / 3.9 to 7 mg °· Tofu, ½ cup / 3.4 mg °· Kidney beans, ½ cup / 2.6 mg °· Baked potato with skin / 2.7 mg °· Asparagus, ½ cup / 2.2 mg °· Avocado / 2 mg °· Dried peaches, ½ cup / 1.6 mg °· Raisins, ½ cup / 1.5 mg °· Soy milk,  1 cup / 1.5 mg °· Whole-wheat bread, 1 slice / 1.2 mg °· Spinach, 1 cup / 0.8 mg °· Broccoli, ½ cup / 0.6 mg °IRON ABSORPTION °Certain foods can decrease the body's absorption of iron. Try to avoid these foods and beverages while eating meals with iron-containing foods: °· Coffee. °· Tea. °· Fiber. °· Soy. °Foods containing vitamin C can help increase the amount of iron your body absorbs from iron sources, especially from nonheme sources. Eat foods with vitamin C along with iron-containing foods to increase your iron absorption. Foods that are high in vitamin C include many fruits and vegetables. Some good sources are: °· Fresh orange juice. °· Oranges. °· Strawberries. °· Mangoes. °· Grapefruit. °· Red bell peppers. °· Green bell peppers. °· Broccoli. °· Potatoes with skin. °· Tomato juice. °Document Released: 05/25/2005 Document Revised: 01/03/2012 Document Reviewed: 04/01/2011 °ExitCare® Patient Information ©2014 [employer]. ° °

## 2013-10-03 NOTE — Progress Notes (Signed)
Pulse: 73

## 2013-10-05 ENCOUNTER — Ambulatory Visit (HOSPITAL_COMMUNITY)
Admission: RE | Admit: 2013-10-05 | Discharge: 2013-10-05 | Disposition: A | Discharge status: Home / Self Care | Payer: Medicaid Other | Source: Ambulatory Visit | Attending: Obstetrics and Gynecology | Admitting: Obstetrics and Gynecology

## 2013-10-05 DIAGNOSIS — N390 Urinary tract infection, site not specified: Secondary | ICD-10-CM

## 2013-10-05 DIAGNOSIS — Z3689 Encounter for other specified antenatal screening: Secondary | ICD-10-CM | POA: Insufficient documentation

## 2013-10-05 DIAGNOSIS — O09292 Supervision of pregnancy with other poor reproductive or obstetric history, second trimester: Secondary | ICD-10-CM

## 2013-10-05 DIAGNOSIS — O09892 Supervision of other high risk pregnancies, second trimester: Secondary | ICD-10-CM

## 2013-10-25 NOTE — L&D Delivery Note (Signed)
Delivery Note At 8:33 PM a viable female was delivered via  (Presentation: ROA).  APGAR: 8,9 ; weight - pending Placenta status: Intact, spontaneous.  Cord:  3 vessel with the following complications: None.  Cord pH: N/A  Anesthesia: Epidural  Episiotomy: N/A Lacerations: None; Perineum intact Suture Repair: N/A Est. Blood Loss (mL): 150 mL  Mom to postpartum.  Baby to Couplet care / Skin to Skin.  Viable female delivered quickly over intact perineum.  Minimal bleeding.  Uterus firm and patient doing well prior to leaving the room.  Tommie SamsJayce G Cook 01/30/2014, 8:43 PM  I was present for delivery and examined patient. I agree with resident's note and plan of care.  Tawana ScaleMichael Ryan Harneet Noblett, MD OB Fellow 01/30/2014 8:57 PM

## 2013-11-07 ENCOUNTER — Encounter: Payer: Medicaid Other | Admitting: Advanced Practice Midwife

## 2013-11-13 ENCOUNTER — Encounter: Payer: Medicaid Other | Admitting: Obstetrics and Gynecology

## 2013-11-20 ENCOUNTER — Encounter: Payer: Medicaid Other | Admitting: Obstetrics & Gynecology

## 2013-12-06 ENCOUNTER — Encounter (HOSPITAL_COMMUNITY): Payer: Self-pay | Admitting: Emergency Medicine

## 2013-12-06 ENCOUNTER — Emergency Department (HOSPITAL_COMMUNITY)
Admission: EM | Admit: 2013-12-06 | Discharge: 2013-12-06 | Disposition: A | Discharge status: Home / Self Care | Payer: Medicaid Other | Attending: Emergency Medicine | Admitting: Emergency Medicine

## 2013-12-06 DIAGNOSIS — K0889 Other specified disorders of teeth and supporting structures: Secondary | ICD-10-CM

## 2013-12-06 DIAGNOSIS — K089 Disorder of teeth and supporting structures, unspecified: Secondary | ICD-10-CM | POA: Insufficient documentation

## 2013-12-06 DIAGNOSIS — Z8719 Personal history of other diseases of the digestive system: Secondary | ICD-10-CM | POA: Insufficient documentation

## 2013-12-06 DIAGNOSIS — Z8659 Personal history of other mental and behavioral disorders: Secondary | ICD-10-CM | POA: Insufficient documentation

## 2013-12-06 DIAGNOSIS — O9989 Other specified diseases and conditions complicating pregnancy, childbirth and the puerperium: Secondary | ICD-10-CM | POA: Insufficient documentation

## 2013-12-06 NOTE — ED Notes (Signed)
Pt reports having upper and lower right side dental pain since yesterday. Reports being 7 months pregnant, good fetal movement, no complaints other than dental pain.

## 2013-12-06 NOTE — ED Provider Notes (Signed)
Medical screening examination/treatment/procedure(s) were performed by non-physician practitioner and as supervising physician I was immediately available for consultation/collaboration.  EKG Interpretation   None         Candyce ChurnJohn David Jamorion Gomillion III, MD 12/06/13 [phone]

## 2013-12-06 NOTE — Discharge Instructions (Signed)
Dental Pain Toothache is pain in or around a tooth. It may get worse with chewing or with cold or heat.  HOME CARE  Only take medicine as told by your dentist or doctor.  Avoid chewing food near the painful tooth until after all treatment is done. Ask your dentist about this. GET HELP RIGHT AWAY IF:   The problem gets worse or new problems appear.  You have a fever.  There is redness and puffiness (swelling) of the face, jaw, or neck.  You cannot open your mouth.  There is pain in the jaw.  There is very bad pain that is not helped by medicine. MAKE SURE YOU:   Understand these instructions.  Will watch your condition.  Will get help right away if you are not doing well or get worse. Document Released: 03/29/2008 Document Revised: 01/03/2012 Document Reviewed: 03/29/2008 [facility] Patient Information 2014 [place], [place].  Use tylenol only for pain control.  Your exam does not reveal any damage to the tooth or swelling of the gums.  Follow-up with your dentist and obstetrician as discussed.

## 2013-12-06 NOTE — ED Notes (Signed)
Pt reports upper and lower right side dental pain where she has a missing tooth and a broken tooth. States she has an appt with the dentist tomorrow but the pain was too bad to wait til then. States she took 325mg  tylenol around 4pm for the pain albeit relief.

## 2013-12-06 NOTE — ED Provider Notes (Signed)
CSN: [id number]     Arrival date & time 12/06/13  1713 History  This chart was scribed for non-physician practitioner, Felicie Mornavid Gustie Bobb, NP, working with Merrie RoofJohn Cole Klugh Wofford III, MD by Shari HeritageAisha Amuda, ED Scribe. This patient was seen in room TR09C/TR09C and the patient's care was started at 6:04 PM.     Chief Complaint  Patient presents with  . Dental Pain    Patient is a 23 y.o. female presenting with tooth pain.  Dental Pain Location:  Upper and lower (right) Severity:  Moderate Onset quality:  Gradual Duration:  1 day Timing:  Constant Chronicity:  New Context: not abscess   Associated symptoms: no fever     HPI Comments: [name] [name] is a 23 y.o. female who presents to the Emergency Department complaining of constant moderate right upper and lower dental pain onset yesterday,. Patient is currently 7 months pregnant. She reports good fetal movement. She denies fever, sore throat, ear pain, shortness of breath or any other symptoms. She has a medical history of acid reflux. She does not smoke.  Past Medical History  Diagnosis Date  . Acid reflux   . Anxiety     hx of anxiety attack   Past Surgical History  Procedure Laterality Date  . No past surgeries    . Wisdom tooth extraction     Family History  Problem Relation Age of Onset  . Other Neg Hx   . Asthma Sister   . Hypertension Brother   . Asthma Brother   . Diabetes Maternal Grandmother    History  Substance Use Topics  . Smoking status: Never Smoker   . Smokeless tobacco: Not on file  . Alcohol Use: No   OB History   Grav Para Term Preterm Abortions TAB SAB Ect Mult Living   2 1 1  0 0 0 0 0 0 1     Review of Systems  Constitutional: Negative for fever.  HENT: Positive for dental problem. Negative for ear pain and sore throat.   Respiratory: Negative for shortness of breath.   All other systems reviewed and are negative.      Allergies  Review of patient's allergies indicates no known allergies.  Home  Medications  No current outpatient prescriptions on file. Triage Vitals: BP 117/62  Pulse 97  Temp(Src) 98.3 F (36.8 C) (Oral)  Resp 18  SpO2 100%  LMP 05/08/2013 Physical Exam  Nursing note and vitals reviewed. Constitutional: She is oriented to person, place, and time. She appears well-developed and well-nourished. No distress.  HENT:  Head: Normocephalic and atraumatic.  Mouth/Throat: Oropharynx is clear and moist and mucous membranes are normal.  Right upper rear molar pain. No gingival swelling. No dental caries.  Eyes: EOM are normal.  Neck: Neck supple. No tracheal deviation present.  Cardiovascular: Normal rate, regular rhythm and normal heart sounds.  Exam reveals no gallop and no friction rub.   No murmur heard. Pulmonary/Chest: Effort normal and breath sounds normal. No respiratory distress. She has no wheezes. She has no rales.  Musculoskeletal: Normal range of motion.  Lymphadenopathy:    She has no cervical adenopathy.  Neurological: She is alert and oriented to person, place, and time.  Skin: Skin is warm and dry.  Psychiatric: She has a normal mood and affect. Her behavior is normal.    ED Course  Procedures (including critical care time) DIAGNOSTIC STUDIES: Oxygen Saturation is 100% on room air, normal by my interpretation.    COORDINATION OF CARE: 6:03  PM- Patient advised to take Tylenol for pain relief because other pain control medications are contraindicated for pregnant women. Patient informed of current plan for treatment and evaluation and agrees with plan at this time.    No indication of gingival swelling or tooth decay on exam. Patient is scheduled to see her dentist tomorrow.  Patient is 7 months pregnant, reports baby has been very active today.  Recommend tylenol for pain.  Follow-up with dentist and OB as needed.  MDM   Final diagnoses:  None   Tooth pain.  I personally performed the services described in this documentation, which was  scribed in my presence. The recorded information has been reviewed and is accurate.   Jimmye Norman, NP 12/06/13 1939

## 2013-12-13 ENCOUNTER — Encounter: Payer: Medicaid Other | Admitting: Family Medicine

## 2013-12-20 ENCOUNTER — Encounter: Payer: Medicaid Other | Admitting: Obstetrics & Gynecology

## 2013-12-27 ENCOUNTER — Encounter: Payer: Medicaid Other | Admitting: Obstetrics & Gynecology

## 2014-01-03 ENCOUNTER — Ambulatory Visit (INDEPENDENT_AMBULATORY_CARE_PROVIDER_SITE_OTHER): Payer: Medicaid Other | Admitting: Obstetrics & Gynecology

## 2014-01-03 VITALS — BP 115/75 | Temp 97.9°F | Wt 182.3 lb

## 2014-01-03 DIAGNOSIS — O09299 Supervision of pregnancy with other poor reproductive or obstetric history, unspecified trimester: Secondary | ICD-10-CM

## 2014-01-03 DIAGNOSIS — Z23 Encounter for immunization: Secondary | ICD-10-CM

## 2014-01-03 LAB — POCT URINALYSIS DIP (DEVICE)
BILIRUBIN URINE: NEGATIVE
GLUCOSE, UA: NEGATIVE mg/dL
HGB URINE DIPSTICK: NEGATIVE
KETONES UR: NEGATIVE mg/dL
Nitrite: NEGATIVE
Protein, ur: NEGATIVE mg/dL
SPECIFIC GRAVITY, URINE: 1.025 (ref 1.005–1.030)
Urobilinogen, UA: 0.2 mg/dL (ref 0.0–1.0)
pH: 6.5 (ref 5.0–8.0)

## 2014-01-03 LAB — CBC
HEMATOCRIT: 27.9 % — AB (ref 36.0–46.0)
Hemoglobin: 9 g/dL — ABNORMAL LOW (ref 12.0–15.0)
MCH: 25.3 pg — ABNORMAL LOW (ref 26.0–34.0)
MCHC: 32.3 g/dL (ref 30.0–36.0)
MCV: 78.4 fL (ref 78.0–100.0)
PLATELETS: 367 10*3/uL (ref 150–400)
RBC: 3.56 MIL/uL — AB (ref 3.87–5.11)
RDW: 14.8 % (ref 11.5–15.5)
WBC: 8 10*3/uL (ref 4.0–10.5)

## 2014-01-03 LAB — HIV ANTIBODY (ROUTINE TESTING W REFLEX): HIV: NONREACTIVE

## 2014-01-03 LAB — RPR

## 2014-01-03 LAB — GLUCOSE TOLERANCE, 1 HOUR (50G) W/O FASTING: Glucose, 1 Hour GTT: 105 mg/dL (ref 70–140)

## 2014-01-03 LAB — OB RESULTS CONSOLE GBS: STREP GROUP B AG: NEGATIVE

## 2014-01-03 LAB — OB RESULTS CONSOLE GC/CHLAMYDIA
CHLAMYDIA, DNA PROBE: NEGATIVE
Gonorrhea: NEGATIVE

## 2014-01-03 MED ORDER — TETANUS-DIPHTH-ACELL PERTUSSIS 5-2.5-18.5 LF-MCG/0.5 IM SUSP: 0.5000 mL | Freq: Once | INTRAMUSCULAR | Status: DC

## 2014-01-03 NOTE — Progress Notes (Signed)
Pulse- 97 Patient reports some abdominal cramping like contractions.

## 2014-01-03 NOTE — Patient Instructions (Signed)
Third Trimester of Pregnancy  The third trimester is from week 29 through week 42, months 7 through 9. The third trimester is a time when the fetus is growing rapidly. At the end of the ninth month, the fetus is about 20 inches in length and weighs 6 10 pounds.   BODY CHANGES  Your body goes through many changes during pregnancy. The changes vary from woman to woman.    Your weight will continue to increase. You can expect to gain 25 35 pounds (11 16 kg) by the end of the pregnancy.   You may begin to get stretch marks on your hips, abdomen, and breasts.   You may urinate more often because the fetus is moving lower into your pelvis and pressing on your bladder.   You may develop or continue to have heartburn as a result of your pregnancy.   You may develop constipation because certain hormones are causing the muscles that push waste through your intestines to slow down.   You may develop hemorrhoids or swollen, bulging veins (varicose veins).   You may have pelvic pain because of the weight gain and pregnancy hormones relaxing your joints between the bones in your pelvis. Back aches may result from over exertion of the muscles supporting your posture.   Your breasts will continue to grow and be tender. A yellow discharge may leak from your breasts called colostrum.   Your belly button may stick out.   You may feel short of breath because of your expanding uterus.   You may notice the fetus "dropping," or moving lower in your abdomen.   You may have a bloody mucus discharge. This usually occurs a few days to a week before labor begins.   Your cervix becomes thin and soft (effaced) near your due date.  WHAT TO EXPECT AT YOUR PRENATAL EXAMS   You will have prenatal exams every 2 weeks until week 36. Then, you will have weekly prenatal exams. During a routine prenatal visit:   You will be weighed to make sure you and the fetus are growing normally.   Your blood pressure is taken.   Your abdomen will be  measured to track your baby's growth.   The fetal heartbeat will be listened to.   Any test results from the previous visit will be discussed.   You may have a cervical check near your due date to see if you have effaced.  At around 36 weeks, your caregiver will check your cervix. At the same time, your caregiver will also perform a test on the secretions of the vaginal tissue. This test is to determine if a type of bacteria, Group B streptococcus, is present. Your caregiver will explain this further.  Your caregiver may ask you:   What your birth plan is.   How you are feeling.   If you are feeling the baby move.   If you have had any abnormal symptoms, such as leaking fluid, bleeding, severe headaches, or abdominal cramping.   If you have any questions.  Other tests or screenings that may be performed during your third trimester include:   Blood tests that check for low iron levels (anemia).   Fetal testing to check the health, activity level, and growth of the fetus. Testing is done if you have certain medical conditions or if there are problems during the pregnancy.  FALSE LABOR  You may feel small, irregular contractions that eventually go away. These are called Braxton Hicks contractions, or   false labor. Contractions may last for hours, days, or even weeks before true labor sets in. If contractions come at regular intervals, intensify, or become painful, it is best to be seen by your caregiver.   SIGNS OF LABOR    Menstrual-like cramps.   Contractions that are 5 minutes apart or less.   Contractions that start on the top of the uterus and spread down to the lower abdomen and back.   A sense of increased pelvic pressure or back pain.   A watery or bloody mucus discharge that comes from the vagina.  If you have any of these signs before the 37th week of pregnancy, call your caregiver right away. You need to go to the hospital to get checked immediately.  HOME CARE INSTRUCTIONS    Avoid all  smoking, herbs, alcohol, and unprescribed drugs. These chemicals affect the formation and growth of the baby.   Follow your caregiver's instructions regarding medicine use. There are medicines that are either safe or unsafe to take during pregnancy.   Exercise only as directed by your caregiver. Experiencing uterine cramps is a good sign to stop exercising.   Continue to eat regular, healthy meals.   Wear a good support bra for breast tenderness.   Do not use hot tubs, steam rooms, or saunas.   Wear your seat belt at all times when driving.   Avoid raw meat, uncooked cheese, cat litter boxes, and soil used by cats. These carry germs that can cause birth defects in the baby.   Take your prenatal vitamins.   Try taking a stool softener (if your caregiver approves) if you develop constipation. Eat more high-fiber foods, such as fresh vegetables or fruit and whole grains. Drink plenty of fluids to keep your urine clear or pale yellow.   Take warm sitz baths to soothe any pain or discomfort caused by hemorrhoids. Use hemorrhoid cream if your caregiver approves.   If you develop varicose veins, wear support hose. Elevate your feet for 15 minutes, 3 4 times a day. Limit salt in your diet.   Avoid heavy lifting, wear low heal shoes, and practice good posture.   Rest a lot with your legs elevated if you have leg cramps or low back pain.   Visit your dentist if you have not gone during your pregnancy. Use a soft toothbrush to brush your teeth and be gentle when you floss.   A sexual relationship may be continued unless your caregiver directs you otherwise.   Do not travel far distances unless it is absolutely necessary and only with the approval of your caregiver.   Take prenatal classes to understand, practice, and ask questions about the labor and delivery.   Make a trial run to the hospital.   Pack your hospital bag.   Prepare the baby's nursery.   Continue to go to all your prenatal visits as directed  by your caregiver.  SEEK MEDICAL CARE IF:   You are unsure if you are in labor or if your water has broken.   You have dizziness.   You have mild pelvic cramps, pelvic pressure, or nagging pain in your abdominal area.   You have persistent nausea, vomiting, or diarrhea.   You have a bad smelling vaginal discharge.   You have pain with urination.  SEEK IMMEDIATE MEDICAL CARE IF:    You have a fever.   You are leaking fluid from your vagina.   You have spotting or bleeding from your vagina.     You have severe abdominal cramping or pain.   You have rapid weight loss or gain.   You have shortness of breath with chest pain.   You notice sudden or extreme swelling of your face, hands, ankles, feet, or legs.   You have not felt your baby move in over an hour.   You have severe headaches that do not go away with medicine.   You have vision changes.  Document Released: 10/05/2001 Document Revised: 06/13/2013 Document Reviewed: 12/12/2012  ExitCare Patient Information 2014 [employer].

## 2014-01-03 NOTE — Progress Notes (Signed)
Non compliant with care, last seen in Dec.  GBS, GC CT Tdap, 1 hr today

## 2014-01-04 LAB — GC/CHLAMYDIA PROBE AMP
CT PROBE, AMP APTIMA: NEGATIVE
GC Probe RNA: NEGATIVE

## 2014-01-06 LAB — CULTURE, BETA STREP (GROUP B ONLY)

## 2014-01-10 ENCOUNTER — Encounter: Payer: Medicaid Other | Admitting: Family Medicine

## 2014-01-17 ENCOUNTER — Encounter: Payer: Medicaid Other | Admitting: Obstetrics and Gynecology

## 2014-01-29 ENCOUNTER — Encounter: Payer: Medicaid Other | Admitting: Obstetrics & Gynecology

## 2014-01-30 ENCOUNTER — Inpatient Hospital Stay (HOSPITAL_COMMUNITY): Payer: Medicaid Other | Admitting: Anesthesiology

## 2014-01-30 ENCOUNTER — Inpatient Hospital Stay (HOSPITAL_COMMUNITY)
Admission: AD | Admit: 2014-01-30 | Discharge: 2014-02-01 | DRG: 774 | Disposition: A | Discharge status: Home / Self Care | Payer: Medicaid Other | Source: Ambulatory Visit | Attending: Family Medicine | Admitting: Family Medicine

## 2014-01-30 ENCOUNTER — Encounter (HOSPITAL_COMMUNITY): Payer: Medicaid Other | Admitting: Anesthesiology

## 2014-01-30 ENCOUNTER — Encounter (HOSPITAL_COMMUNITY): Payer: Self-pay | Admitting: *Deleted

## 2014-01-30 DIAGNOSIS — O09299 Supervision of pregnancy with other poor reproductive or obstetric history, unspecified trimester: Secondary | ICD-10-CM

## 2014-01-30 DIAGNOSIS — E669 Obesity, unspecified: Secondary | ICD-10-CM

## 2014-01-30 DIAGNOSIS — O9902 Anemia complicating childbirth: Secondary | ICD-10-CM | POA: Diagnosis present

## 2014-01-30 DIAGNOSIS — IMO0001 Reserved for inherently not codable concepts without codable children: Secondary | ICD-10-CM

## 2014-01-30 DIAGNOSIS — F411 Generalized anxiety disorder: Secondary | ICD-10-CM

## 2014-01-30 DIAGNOSIS — O1002 Pre-existing essential hypertension complicating childbirth: Secondary | ICD-10-CM

## 2014-01-30 DIAGNOSIS — O99344 Other mental disorders complicating childbirth: Principal | ICD-10-CM | POA: Diagnosis present

## 2014-01-30 DIAGNOSIS — Z6831 Body mass index (BMI) 31.0-31.9, adult: Secondary | ICD-10-CM

## 2014-01-30 DIAGNOSIS — D649 Anemia, unspecified: Secondary | ICD-10-CM

## 2014-01-30 DIAGNOSIS — O99214 Obesity complicating childbirth: Secondary | ICD-10-CM

## 2014-01-30 DIAGNOSIS — K219 Gastro-esophageal reflux disease without esophagitis: Secondary | ICD-10-CM | POA: Diagnosis present

## 2014-01-30 HISTORY — DX: Gonococcal infection, unspecified: A54.9

## 2014-01-30 LAB — CBC
HEMATOCRIT: 30.9 % — AB (ref 36.0–46.0)
HEMOGLOBIN: 9.6 g/dL — AB (ref 12.0–15.0)
MCH: 24.6 pg — ABNORMAL LOW (ref 26.0–34.0)
MCHC: 31.1 g/dL (ref 30.0–36.0)
MCV: 79.2 fL (ref 78.0–100.0)
Platelets: 382 10*3/uL (ref 150–400)
RBC: 3.9 MIL/uL (ref 3.87–5.11)
RDW: 16 % — AB (ref 11.5–15.5)
WBC: 10.7 10*3/uL — AB (ref 4.0–10.5)

## 2014-01-30 LAB — RPR

## 2014-01-30 MED ORDER — DIBUCAINE 1 % RE OINT: 1.0000 "application " | TOPICAL_OINTMENT | RECTAL | Status: DC | PRN

## 2014-01-30 MED ORDER — WITCH HAZEL-GLYCERIN EX PADS: 1.0000 "application " | MEDICATED_PAD | CUTANEOUS | Status: DC | PRN

## 2014-01-30 MED ORDER — SIMETHICONE 80 MG PO CHEW: 80.0000 mg | CHEWABLE_TABLET | ORAL | Status: DC | PRN

## 2014-01-30 MED ORDER — BENZOCAINE-MENTHOL 20-0.5 % EX AERO: 1.0000 "application " | INHALATION_SPRAY | CUTANEOUS | Status: DC | PRN

## 2014-01-30 MED ORDER — EPHEDRINE 5 MG/ML INJ
10.0000 mg | INTRAVENOUS | Status: DC | PRN
  Filled 2014-01-30: qty 4
  Filled 2014-01-30: qty 2

## 2014-01-30 MED ORDER — ACETAMINOPHEN 325 MG PO TABS: 650.0000 mg | ORAL_TABLET | ORAL | Status: DC | PRN

## 2014-01-30 MED ORDER — LANOLIN HYDROUS EX OINT: TOPICAL_OINTMENT | CUTANEOUS | Status: DC | PRN

## 2014-01-30 MED ORDER — LIDOCAINE HCL (PF) 1 % IJ SOLN
30.0000 mL | INTRAMUSCULAR | Status: DC | PRN
  Filled 2014-01-30: qty 30

## 2014-01-30 MED ORDER — LACTATED RINGERS IV SOLN
INTRAVENOUS | Status: DC
  Administered 2014-01-30 (×2): 125 mL/h via INTRAVENOUS

## 2014-01-30 MED ORDER — OXYTOCIN BOLUS FROM INFUSION
500.0000 mL | INTRAVENOUS | Status: DC
Start: 2014-01-30 — End: 2014-02-01
  Administered 2014-01-30: 500 mL via INTRAVENOUS

## 2014-01-30 MED ORDER — CITRIC ACID-SODIUM CITRATE 334-500 MG/5ML PO SOLN: 30.0000 mL | ORAL | Status: DC | PRN

## 2014-01-30 MED ORDER — ONDANSETRON HCL 4 MG/2ML IJ SOLN: 4.0000 mg | Freq: Four times a day (QID) | INTRAMUSCULAR | Status: DC | PRN

## 2014-01-30 MED ORDER — IBUPROFEN 600 MG PO TABS
600.0000 mg | ORAL_TABLET | Freq: Four times a day (QID) | ORAL | Status: DC
  Administered 2014-01-31 – 2014-02-01 (×6): 600 mg via ORAL
  Filled 2014-01-30 (×6): qty 1

## 2014-01-30 MED ORDER — FENTANYL CITRATE 0.05 MG/ML IJ SOLN
100.0000 ug | INTRAMUSCULAR | Status: DC | PRN
  Administered 2014-01-30: 100 ug via INTRAVENOUS
  Filled 2014-01-30: qty 2

## 2014-01-30 MED ORDER — OXYCODONE-ACETAMINOPHEN 5-325 MG PO TABS
1.0000 | ORAL_TABLET | ORAL | Status: DC | PRN
  Administered 2014-01-30: 2 via ORAL
  Filled 2014-01-30: qty 1
  Filled 2014-01-30 (×4): qty 2

## 2014-01-30 MED ORDER — SENNOSIDES-DOCUSATE SODIUM 8.6-50 MG PO TABS
2.0000 | ORAL_TABLET | ORAL | Status: DC
  Administered 2014-01-31: 2 via ORAL
  Filled 2014-01-30: qty 2

## 2014-01-30 MED ORDER — FENTANYL 2.5 MCG/ML BUPIVACAINE 1/10 % EPIDURAL INFUSION (WH - ANES)
INTRAMUSCULAR | Status: DC | PRN
  Administered 2014-01-30: 14 mL/h via EPIDURAL

## 2014-01-30 MED ORDER — ONDANSETRON HCL 4 MG/2ML IJ SOLN: 4.0000 mg | INTRAMUSCULAR | Status: DC | PRN

## 2014-01-30 MED ORDER — ONDANSETRON HCL 4 MG PO TABS: 4.0000 mg | ORAL_TABLET | ORAL | Status: DC | PRN

## 2014-01-30 MED ORDER — LIDOCAINE HCL (PF) 1 % IJ SOLN
INTRAMUSCULAR | Status: DC | PRN
  Administered 2014-01-30 (×2): 4 mL

## 2014-01-30 MED ORDER — PRENATAL MULTIVITAMIN CH
1.0000 | ORAL_TABLET | Freq: Every day | ORAL | Status: DC
  Administered 2014-01-31 – 2014-02-01 (×2): 1 via ORAL
  Filled 2014-01-30 (×2): qty 1

## 2014-01-30 MED ORDER — DIPHENHYDRAMINE HCL 50 MG/ML IJ SOLN: 12.5000 mg | INTRAMUSCULAR | Status: DC | PRN

## 2014-01-30 MED ORDER — EPHEDRINE 5 MG/ML INJ
10.0000 mg | INTRAVENOUS | Status: DC | PRN
  Filled 2014-01-30: qty 2

## 2014-01-30 MED ORDER — TETANUS-DIPHTH-ACELL PERTUSSIS 5-2.5-18.5 LF-MCG/0.5 IM SUSP: 0.5000 mL | Freq: Once | INTRAMUSCULAR | Status: DC

## 2014-01-30 MED ORDER — ZOLPIDEM TARTRATE 5 MG PO TABS: 5.0000 mg | ORAL_TABLET | Freq: Every evening | ORAL | Status: DC | PRN

## 2014-01-30 MED ORDER — OXYCODONE-ACETAMINOPHEN 5-325 MG PO TABS
1.0000 | ORAL_TABLET | ORAL | Status: DC | PRN
  Administered 2014-01-31 (×2): 1 via ORAL
  Administered 2014-01-31 – 2014-02-01 (×3): 2 via ORAL
  Filled 2014-01-30: qty 2

## 2014-01-30 MED ORDER — OXYTOCIN 40 UNITS IN LACTATED RINGERS INFUSION - SIMPLE MED
62.5000 mL/h | INTRAVENOUS | Status: DC
  Administered 2014-01-30: 62.5 mL/h via INTRAVENOUS
  Filled 2014-01-30: qty 1000

## 2014-01-30 MED ORDER — PHENYLEPHRINE 40 MCG/ML (10ML) SYRINGE FOR IV PUSH (FOR BLOOD PRESSURE SUPPORT)
80.0000 ug | PREFILLED_SYRINGE | INTRAVENOUS | Status: DC | PRN
  Filled 2014-01-30: qty 2
  Filled 2014-01-30: qty 10

## 2014-01-30 MED ORDER — IBUPROFEN 600 MG PO TABS
600.0000 mg | ORAL_TABLET | Freq: Four times a day (QID) | ORAL | Status: DC | PRN
  Administered 2014-01-30: 600 mg via ORAL
  Filled 2014-01-30: qty 1

## 2014-01-30 MED ORDER — LACTATED RINGERS IV SOLN: 500.0000 mL | Freq: Once | INTRAVENOUS | Status: DC

## 2014-01-30 MED ORDER — PHENYLEPHRINE 40 MCG/ML (10ML) SYRINGE FOR IV PUSH (FOR BLOOD PRESSURE SUPPORT)
80.0000 ug | PREFILLED_SYRINGE | INTRAVENOUS | Status: DC | PRN
  Filled 2014-01-30: qty 2

## 2014-01-30 MED ORDER — LACTATED RINGERS IV SOLN: 500.0000 mL | INTRAVENOUS | Status: DC | PRN

## 2014-01-30 MED ORDER — DIPHENHYDRAMINE HCL 25 MG PO CAPS: 25.0000 mg | ORAL_CAPSULE | Freq: Four times a day (QID) | ORAL | Status: DC | PRN

## 2014-01-30 MED ORDER — FENTANYL 2.5 MCG/ML BUPIVACAINE 1/10 % EPIDURAL INFUSION (WH - ANES)
14.0000 mL/h | INTRAMUSCULAR | Status: DC | PRN
  Filled 2014-01-30 (×2): qty 125

## 2014-01-30 NOTE — Progress Notes (Signed)
[name] [name] is a 23 y.o. G2P1001 at 758w5d admitted for active labor without SROM.  Subjective: S/p epidural with some sensitivity but no pain. Urge to push with contractions.  Objective: BP 121/73  Pulse 85  Temp(Src) 98.1 F (36.7 C) (Oral)  Resp 18  Ht 5\' 5"  (1.651 m)  Wt 86.75 kg (191 lb 4 oz)  BMI 31.83 kg/m2  SpO2 100%  LMP 05/08/2013 I/O last 3 completed shifts: In: -  Out: 1200 [Urine:1200]    FHT:  FHR: 140 bpm, variability: moderate,  accelerations:  Present,  decelerations:  Absent UC:   regular, every 2-4 minutes SVE:   Dilation: 9 Effacement (%): 100 Station: 0 Exam by:: S NIx RN  AROM performed @ 1950, gush of clear fluids, no cord prolapse.  Labs: Lab Results  Component Value Date   WBC 10.7* 01/30/2014   HGB 9.6* 01/30/2014   HCT 30.9* 01/30/2014   MCV 79.2 01/30/2014   PLT 382 01/30/2014    Assessment / Plan: Spontaneous labor, progressing normally  Labor: Progressing normally. Expectant management. Anticipated delivery following AROM.  Preeclampsia:  n/a Fetal Wellbeing:  Category I Pain Control:  Epidural I/D:  GBS negative Anticipated MOD:  NSVD  Saralyn Pilarlexander Karamalegos 01/30/2014, 8:29 PM  I spoke with and examined patient and agree with resident's note and plan of care.  Tawana ScaleMichael Ryan Aleece Loyd, MD OB Fellow 01/30/2014 8:57 PM

## 2014-01-30 NOTE — Anesthesia Preprocedure Evaluation (Signed)
Anesthesia Evaluation  Patient identified by MRN, date of birth, ID band Patient awake    Reviewed: Allergy & Precautions, H&P , Patient's Chart, lab work & pertinent test results  Airway Mallampati: III TM Distance: >3 FB Neck ROM: Full    Dental no notable dental hx. (+) Teeth Intact   Pulmonary neg pulmonary ROS,  breath sounds clear to auscultation  Pulmonary exam normal       Cardiovascular hypertension, Rhythm:Regular Rate:Normal  Pre eclampsia with prior pregnancy   Neuro/Psych PSYCHIATRIC DISORDERS Anxiety Learning disabilitynegative neurological ROS     GI/Hepatic Neg liver ROS, GERD-  Medicated and Controlled,  Endo/Other  Obesity  Renal/GU negative Renal ROS  negative genitourinary   Musculoskeletal negative musculoskeletal ROS (+)   Abdominal (+) + obese,   Peds  Hematology  (+) anemia ,   Anesthesia Other Findings   Reproductive/Obstetrics (+) Pregnancy                           Anesthesia Physical Anesthesia Plan  ASA: II  Anesthesia Plan: Epidural   Post-op Pain Management:    Induction:   Airway Management Planned: Natural Airway  Additional Equipment:   Intra-op Plan:   Post-operative Plan:   Informed Consent: I have reviewed the patients History and Physical, chart, labs and discussed the procedure including the risks, benefits and alternatives for the proposed anesthesia with the patient or authorized representative who has indicated his/her understanding and acceptance.     Plan Discussed with: Anesthesiologist  Anesthesia Plan Comments:         Anesthesia Quick Evaluation

## 2014-01-30 NOTE — MAU Note (Signed)
Contractions started at 0500, now 5 minutes apart. No bleeding or leaking.  No problems with preg.  Missed last appt yesterday.

## 2014-01-30 NOTE — Anesthesia Procedure Notes (Signed)
Epidural Patient location during procedure: OB Start time: 01/30/2014 3:08 PM  Staffing Anesthesiologist: Oveda Dadamo A. Performed by: anesthesiologist   Preanesthetic Checklist Completed: patient identified, site marked, surgical consent, pre-op evaluation, timeout performed, IV checked, risks and benefits discussed and monitors and equipment checked  Epidural Patient position: sitting Prep: site prepped and draped and DuraPrep Patient monitoring: continuous pulse ox and blood pressure Approach: midline Location: L3-L4 Injection technique: LOR air  Needle:  Needle type: Tuohy  Needle gauge: 17 G Needle length: 9 cm and 9 Needle insertion depth: 5 cm cm Catheter type: closed end flexible Catheter size: 19 Gauge Catheter at skin depth: 10 cm Test dose: negative and Other  Assessment Events: blood not aspirated, injection not painful, no injection resistance, negative IV test and no paresthesia  Additional Notes Patient identified. Risks and benefits discussed including failed block, incomplete  Pain control, post dural puncture headache, nerve damage, paralysis, blood pressure Changes, nausea, vomiting, reactions to medications-both toxic and allergic and post Partum back pain. All questions were answered. Patient expressed understanding and wished to proceed. Sterile technique was used throughout procedure. Epidural site was Dressed with sterile barrier dressing. No paresthesias, signs of intravascular injection Or signs of intrathecal spread were encountered.  Patient was more comfortable after the epidural was dosed. Please see RN's note for documentation of vital signs and FHR which are stable.

## 2014-01-30 NOTE — H&P (Signed)
[name] [name] is a 23 y.o. female G2P1001 with IUP at 2321w5d presenting for SOL with regular UC. Pt states she has been having regular increasing strength contractions , every 5 minutes (started about 0500 today). Denies any vaginal bleeding.  Membranes are intact, denies any LOF, with active fetal movement.    [facility]/Com[facility] significant for closely spaced pregnancy (prior term delivery on 08/22/12)  Past Medical History: Past Medical History  Diagnosis Date  . Acid reflux   . Anxiety     hx of anxiety attack  . Gonorrhea     Past Surgical History: Past Surgical History  Procedure Laterality Date  . No past surgeries    . Wisdom tooth extraction      Obstetrical History: OB History   Grav Para Term Preterm Abortions TAB SAB Ect Mult Living   2 1 1  0 0 0 0 0 0 1     Social History: History   Social History  . Marital Status: Single    Spouse Name: N/A    Number of Children: N/A  . Years of Education: N/A   Social History Main Topics  . Smoking status: Never Smoker   . Smokeless tobacco: None  . Alcohol Use: No  . Drug Use: No  . Sexual Activity: Yes    Birth Control/ Protection: None   Other Topics Concern  . None   Social History Narrative  . None    Family History: Family History  Problem Relation Age of Onset  . Other Neg Hx   . Asthma Sister   . Hypertension Brother   . Asthma Brother   . Diabetes Maternal Grandmother     Allergies: No Known Allergies  Facility-administered medications prior to admission  Medication Dose Route Frequency Provider Last Rate Last Dose  . Tdap (BOOSTRIX) injection 0.5 mL  0.5 mL Intramuscular Once Adam PhenixJames G Arnold, MD       No prescriptions prior to admission    Review of Systems: Negative unless otherwise stated in History above  Physicial Blood pressure 130/76, pulse 72, temperature 97.6 F (36.4 C), temperature source Oral, resp. rate 16, height 5\' 5"   (1.651 m), weight 86.75 kg (191 lb 4 oz), last menstrual period 05/08/2013, SpO2 100.00%. General appearance: alert and cooperative, uncomfortable with contractions, otherwise NAD Lungs: clear to auscultation bilaterally Heart: regular rate and rhythm Abdomen: soft, non-tender; bowel sounds normal Extremities: Homans sign is negative, no sign of DVT DTR's +2 Presentation: cephalic Fetal monitoringBaseline: 130 bpm, moderate variability, accels present, no decels Uterine activity: regular contractions q 2-6 min  Dilation: 4 Effacement (%): 70 Station: -2 Exam by:: Shavell Nored  Prenatal labs: ABO, Rh: A/POS/-- (10/27 1630) Antibody: NEG (10/27 1630) Rubella:  immune RPR: NON REAC (03/12 1158)  HBsAg: NEGATIVE (10/27 1630)  HIV: NON REACTIVE (03/12 1158)  GBS:   negative GTT: 1 hr passed  Prenatal Transfer Tool  Maternal Diabetes: No Genetic Screening: Normal Maternal Ultrasounds/Referrals: Normal Fetal Ultrasounds or other Referrals:  None Maternal Substance Abuse:  No Significant Maternal Medications:  None Significant Maternal Lab Results: Lab values include: Group B Strep negative  No results found for this or any previous visit (from the past 24 hour(s)).  Assessment: [name] Planeakia [place] is a 23 y.o. G2P1001 at 3421w5d by here for SOL without SROM, triaged in MAU with active cervical change and progression of labor. FHT reassuring and reactive.  #Labor: Expectant management. Consider augmentation with Pitocin if not progressing. #  Labs - pending repeat RPR, CBC #Pain: Fentanyl, planning epidural #FWB: CAT-1 #ID: GBS negative #Feeding: Formula bottle feeding #MOC: Nexplanon  Saralyn Pilar, DO [facility], PGY-1  01/30/2014, 2:01 PM  I spoke with and examined patient and agree with resident's note and plan of care.  Tawana Scale, MD OB Fellow 01/30/2014 4:25 PM

## 2014-01-31 LAB — CBC
HCT: 27.9 % — ABNORMAL LOW (ref 36.0–46.0)
HEMOGLOBIN: 8.6 g/dL — AB (ref 12.0–15.0)
MCH: 24.6 pg — ABNORMAL LOW (ref 26.0–34.0)
MCHC: 30.8 g/dL (ref 30.0–36.0)
MCV: 79.7 fL (ref 78.0–100.0)
Platelets: 328 10*3/uL (ref 150–400)
RBC: 3.5 MIL/uL — AB (ref 3.87–5.11)
RDW: 16 % — ABNORMAL HIGH (ref 11.5–15.5)
WBC: 10.7 10*3/uL — ABNORMAL HIGH (ref 4.0–10.5)

## 2014-01-31 MED ORDER — FERROUS SULFATE 325 (65 FE) MG PO TABS
325.0000 mg | ORAL_TABLET | Freq: Every day | ORAL | Status: DC
  Administered 2014-01-31 – 2014-02-01 (×2): 325 mg via ORAL
  Filled 2014-01-31 (×2): qty 1

## 2014-01-31 NOTE — Progress Notes (Signed)
Post Partum Day 1 Subjective: no complaints, up ad lib, voiding, tolerating PO and + flatus. No BM. Reduced VB  Objective: Blood pressure 119/81, pulse 62, temperature 97.5 F (36.4 C), temperature source Oral, resp. rate 18, height 5\' 5"  (1.651 m), weight 86.75 kg (191 lb 4 oz), last menstrual period 05/08/2013, SpO2 99.00%, unknown if currently breastfeeding.  Physical Exam:  General: alert and cooperative Lochia: appropriate Uterine Fundus: firm, at U DVT Evaluation: No evidence of DVT seen on physical exam. Negative Homan's sign. No cords or calf tenderness. No significant calf/ankle edema.   Recent Labs  01/30/14 1420 01/31/14 0544  HGB 9.6* 8.6*  HCT 30.9* 27.9*    Assessment/Plan: Plan for discharge tomorrow, Formula bottle feeding, and Contraception Nexplanon - Continue pain control with Motrin, Percocet PRN breakthrough - Iron PO   LOS: 1 day   Saralyn Pilarlexander Jeancarlo Leffler 01/31/2014, 7:46 AM

## 2014-01-31 NOTE — Progress Notes (Signed)
UR completed 

## 2014-01-31 NOTE — Anesthesia Postprocedure Evaluation (Signed)
  Anesthesia Post-op Note  Patient: [name] [name]  Procedure(s) Performed: * No procedures listed *  Patient Location: Mother/Baby  Anesthesia Type:Epidural  Level of Consciousness: awake  Airway and Oxygen Therapy: Patient Spontanous Breathing  Post-op Pain: mild  Post-op Assessment: Patient's Cardiovascular Status Stable and Respiratory Function Stable  Post-op Vital Signs: stable  Last Vitals:  Filed Vitals:   01/31/14 0600  BP: 119/81  Pulse: 62  Temp: 36.4 C  Resp: 18    Complications: No apparent anesthesia complications

## 2014-01-31 NOTE — Progress Notes (Signed)
Patient seen and examined, agree with above note.  [name] [name] 01/31/2014 9:18 PM

## 2014-01-31 NOTE — H&P (Signed)
Attestation of Attending Supervision of Obstetric Fellow: Evaluation and management procedures were performed by the Obstetric Fellow under my supervision and collaboration.  I have reviewed the Obstetric Fellow's note and chart, and I agree with the management and plan.  Neenah Canter, DO Attending Physician Faculty Practice, [facility]  

## 2014-01-31 NOTE — Clinical Social Work Maternal (Signed)
Clinical Social Work Department PSYCHOSOCIAL ASSESSMENT - MATERNAL/CHILD 01/31/2014  Patient:  [name] [name], [name] [name]  Account Number:  [id number]  Admit Date:  01/30/2014  Ardine Eng Name:   Clotilde Dieter    Clinical Social Worker:  Gerri Spore, LCSW   Date/Time:  01/31/2014 02:10 PM  Date Referred:  01/31/2014   Referral source  CN     Referred reason  Depression/Anxiety   Other referral source:    I:  FAMILY / [place] legal guardian:  PARENT  Guardian - Name Guardian - Age Guardian - Address  Rovena Hearld [address] [place], [place] [address]  Annabell Howells 24    Other household support members/support persons Name Relationship DOB  Kenedy    SISTER 63 years old   SISTER 19 years old   SISTER 68 years old  Marjo Grosvenor 08/22/12   Other support:    II  PSYCHOSOCIAL DATA Information Source:  Patient Interview  Museum/gallery curator and Community Resources Employment:   Editor, commissioning resources:  Medicaid If [place]:  [place] Other  [place] / Grade:   Maternity Care Coordinator / Child Services Coordination / Early Interventions:  Cultural issues impacting care:    III  STRENGTHS Strengths  Adequate Resources  Home prepared for Child (including basic supplies)  Supportive family/friends   Strength comment:    IV  RISK FACTORS AND CURRENT PROBLEMS Current Problem:  YES   Risk Factor & Current Problem Patient Issue Family Issue Risk Factor / Current Problem Comment  Mental Illness Y N Hx of anxiety  Knowledge/Cognitive Deficit Y N ID    V  SOCIAL WORK ASSESSMENT CSW referral received to assess pts history of anxiety & ability to care for 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 Pt lives with her mother, 3 younger siblings & 66 year old daughter.  She was alone when CSW entered the room.  Pt spoke in a soft voice however was able to answer CSW questions appropriately.  She states she has experienced  anxiety attacks in the past however was not able to identify the source.  She denies any history of depression. CSW inquired about pts feelings towards the infant & she said "I'm happy about it."  CSW commented on pts flat affect & pt explained that she was in pain.  FOB is the father of both children.  According to her, he is not involved.  She plans to pursue child support upon discharge.  She identified her mother as her primary support person.  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  Pt maintained eye contact & smiled when appropriate. CSW met with pt after the delivery of her son in 2013.  Pts situation/support system seems to be the same.  Pt has all the necessary supplies for the infant.  CSW discussed PP depression signs/symptoms with pt & encouraged her to seek medical attention if needed.  Pt seems appropriate with cognitive limitations considered.  CSW does not identify any barriers to discharge at this time.  CSW available to assist further if needed.      VI SOCIAL WORK PLAN Social Work Plan  No Further Intervention Required / No Barriers to Discharge   Type of pt/family education:   If child protective services report - county:   If child protective services report - date:   Information/referral  to community resources comment:   Other social work plan:

## 2014-02-01 DIAGNOSIS — D649 Anemia, unspecified: Secondary | ICD-10-CM

## 2014-02-01 MED ORDER — IBUPROFEN 600 MG PO TABS: 600.0000 mg | ORAL_TABLET | Freq: Four times a day (QID) | ORAL | Status: DC | PRN

## 2014-02-01 MED ORDER — PRENATAL MULTIVITAMIN CH: 1.0000 | ORAL_TABLET | Freq: Every day | ORAL | Status: DC

## 2014-02-01 MED ORDER — FERROUS SULFATE 325 (65 FE) MG PO TABS: 325.0000 mg | ORAL_TABLET | Freq: Three times a day (TID) | ORAL | Status: DC

## 2014-02-01 NOTE — Discharge Summary (Signed)
Obstetric Discharge Summary Reason for Admission: onset of labor Prenatal Procedures: none Intrapartum Procedures: spontaneous vaginal delivery Postpartum Procedures: none Complications-Operative and Postpartum: none  Hospital Course: [name] [name] is a 23 y.o., G2P2002, 7815w5d admitted for SOL. She delivered a healthy baby girl on 01/30/2014 at 2033. Patient is doing well, up ad lib, tolerating PO, voiding. Vaginal bleeding has continued to back down. [name] plans on [place] [employer] for [employer]. She is bottle feeding.  Plan is to d/c home today. Patient advised to eat iron-rich foods.   Delivery Note At 8:33 PM a viable female was delivered via (Presentation: ROA). APGAR: 8,9 ; weight - pending  Placenta status: Intact, spontaneous. Cord: 3 vessel with the following complications: None. Cord pH: N/A  Anesthesia: Epidural  Episiotomy: N/A  Lacerations: None; Perineum intact  Suture Repair: N/A  Est. Blood Loss (mL): 150 mL  Mom to postpartum. Baby to Couplet care / Skin to Skin.  [name] BambergMario Cynara [name] 02/01/2014, 6:14 AM     H/H: Lab Results  Component Value Date/Time   HGB 8.6* 01/31/2014  5:44 AM   HCT 27.9* 01/31/2014  5:44 AM    Filed Vitals:   02/01/14 0604  BP: 123/84  Pulse: 60  Temp: 98.3 F (36.8 C)  Resp: 18    Physical Exam: VSS NAD Abd: Appropriately tender, ND, Fundus @U -firm Incision: None No c/c/e, Neg homan's sign, neg cords Lochia Appropriate  Discharge Diagnoses: Term Pregnancy-delivered  Discharge Information: Date: 05/06/2011 Activity: pelvic rest Diet: routine  Medications: None Breast feeding:  No: bottle feeding Condition: stable Instructions: refer to handout Discharge to: home      Medication List    Notice   You have not been prescribed any medications.       [name] BambergMario [name] [name] 02/01/2014,6:14 AM

## 2014-02-01 NOTE — Discharge Instructions (Signed)

## 2014-02-01 NOTE — Discharge Summary (Signed)
Patient seen and examined.  Agree with above note.  Levie HeritageJacob J Stinson, DO 02/01/2014 7:45 AM

## 2014-03-25 ENCOUNTER — Ambulatory Visit: Payer: Medicaid Other | Admitting: Obstetrics and Gynecology

## 2014-06-05 ENCOUNTER — Emergency Department (HOSPITAL_COMMUNITY)
Admission: EM | Admit: 2014-06-05 | Discharge: 2014-06-05 | Disposition: A | Discharge status: Home / Self Care | Payer: Medicaid Other | Attending: Emergency Medicine | Admitting: Emergency Medicine

## 2014-06-05 ENCOUNTER — Encounter (HOSPITAL_COMMUNITY): Payer: Self-pay | Admitting: Emergency Medicine

## 2014-06-05 DIAGNOSIS — Z3201 Encounter for pregnancy test, result positive: Secondary | ICD-10-CM | POA: Insufficient documentation

## 2014-06-05 DIAGNOSIS — Z8619 Personal history of other infectious and parasitic diseases: Secondary | ICD-10-CM | POA: Diagnosis not present

## 2014-06-05 DIAGNOSIS — Z3200 Encounter for pregnancy test, result unknown: Secondary | ICD-10-CM | POA: Insufficient documentation

## 2014-06-05 DIAGNOSIS — Z3490 Encounter for supervision of normal pregnancy, unspecified, unspecified trimester: Secondary | ICD-10-CM

## 2014-06-05 DIAGNOSIS — Z8659 Personal history of other mental and behavioral disorders: Secondary | ICD-10-CM | POA: Insufficient documentation

## 2014-06-05 DIAGNOSIS — Z8719 Personal history of other diseases of the digestive system: Secondary | ICD-10-CM | POA: Insufficient documentation

## 2014-06-05 DIAGNOSIS — Z79899 Other long term (current) drug therapy: Secondary | ICD-10-CM | POA: Diagnosis not present

## 2014-06-05 LAB — POC URINE PREG, ED: PREG TEST UR: POSITIVE — AB

## 2014-06-05 MED ORDER — PRENATAL COMPLETE 14-0.4 MG PO TABS: 1.0000 | ORAL_TABLET | Freq: Every day | ORAL | Status: DC

## 2014-06-05 NOTE — ED Notes (Signed)
PT denies any issues that brought her in.

## 2014-06-05 NOTE — ED Notes (Signed)
Spoke with PA about FT appropRIATENESS.

## 2014-06-05 NOTE — ED Notes (Signed)
Pt requesting a pregnancy test and to possibly get ultrasound to make sure the baby is fine but denies any complaints. No acute distress noted at triage. Pt is unsure of last period, thinks it was beginning of July.

## 2014-06-05 NOTE — ED Provider Notes (Signed)
CSN: [id number]     Arrival date & time 06/05/14  1843 History   First MD Initiated Contact with Patient 06/05/14 2224     This chart was scribed for non-physician practitioner Paulita Cradle, PA-C, working with Nelia Shi, MD by Gwenevere Abbot, ED scribe. This patient was seen in room TR05C/TR05C and the patient's care was started at 10:40 PM.  Chief Complaint  Patient presents with  . Possible Pregnancy   The history is provided by the patient. No language interpreter was used.   HPI Comments:  [name] [name] is a 23 y.o. female who presents to the Emergency Department seeking documentation so that she can get pre-natal care. Pt states LMP was in July. Pt denies any complications. Pt states that her PCP is Dr. Mayford Knife.    Past Medical History  Diagnosis Date  . Acid reflux   . Anxiety     hx of anxiety attack  . Gonorrhea    Past Surgical History  Procedure Laterality Date  . No past surgeries    . Wisdom tooth extraction     Family History  Problem Relation Age of Onset  . Other Neg Hx   . Asthma Sister   . Hypertension Brother   . Asthma Brother   . Diabetes Maternal Grandmother    History  Substance Use Topics  . Smoking status: Never Smoker   . Smokeless tobacco: Not on file  . Alcohol Use: No   OB History   Grav Para Term Preterm Abortions TAB SAB Ect Mult Living   2 2 2  0 0 0 0 0 0 2     Review of Systems  All other systems reviewed and are negative.     Allergies  Review of patient's allergies indicates no known allergies.  Home Medications   Prior to Admission medications   Medication Sig Start Date End Date Taking? Authorizing Provider  ferrous sulfate 325 (65 FE) MG tablet Take 1 tablet (325 mg total) by mouth 3 (three) times daily with meals. 02/01/14   Levie Heritage, DO  ibuprofen (ADVIL,MOTRIN) 600 MG tablet Take 1 tablet (600 mg total) by mouth every 6 (six) hours as needed (pain scale < 4). 02/01/14   Levie Heritage, DO  Prenatal  Vit-Fe Fumarate-FA (PRENATAL MULTIVITAMIN) TABS tablet Take 1 tablet by mouth daily at 12 noon. 02/01/14   Rhona Raider Stinson, DO   BP 124/65  Pulse 61  Temp(Src) 98.5 F (36.9 C) (Oral)  Resp 18  SpO2 97%  LMP 04/24/2014 Physical Exam  Nursing note and vitals reviewed. Constitutional: She is oriented to person, place, and time. She appears well-developed and well-nourished. No distress.  HENT:  Head: Normocephalic and atraumatic.  Eyes: Conjunctivae and EOM are normal.  Neck: Normal range of motion. Neck supple.  Cardiovascular: Normal rate and regular rhythm.  Exam reveals no gallop and no friction rub.   No murmur heard. Pulmonary/Chest: Effort normal and breath sounds normal. She has no wheezes. She has no rales. She exhibits no tenderness.  Abdominal: Soft. There is no tenderness.  Musculoskeletal: Normal range of motion.  Neurological: She is alert and oriented to person, place, and time. Coordination normal.  Speech is goal-oriented. Moves limbs without ataxia.   Skin: Skin is warm and dry.  Psychiatric: She has a normal mood and affect. Her behavior is normal.    ED Course  Procedures  DIAGNOSTIC STUDIES: Oxygen Saturation is 99% on RA, normal by my interpretation.  COORDINATION OF  CARE: 10:45 PM-Discussed treatment plan with pt at bedside and pt agreed to plan.  Labs Review Labs Reviewed  POC URINE PREG, ED - Abnormal; Notable for the following:    Preg Test, Ur POSITIVE (*)    All other components within normal limits    Imaging Review No results found.   EKG Interpretation None      MDM   Final diagnoses:  Pregnancy    Patient's pregnancy test is positive. She is requesting documentation for medicaid. Vitals stable and patient afebrile. I will prescribe prenatal vitamins.   I personally performed the services described in this documentation, which was scribed in my presence. The recorded information has been reviewed and is accurate.      Emilia BeckKaitlyn  Kammy Klett, PA-C 06/06/14 1700

## 2014-06-05 NOTE — Discharge Instructions (Signed)
Take prenatal vitamins daily. Refer to attached documents for more information. [facility]. Consider using birth control methods.

## 2014-06-07 NOTE — ED Provider Notes (Signed)
Medical screening examination/treatment/procedure(s) were performed by non-physician practitioner and as supervising physician I was immediately available for consultation/collaboration.     [name] Lyonsouglas Alexxis Mackert, MD 06/07/14 [phone]

## 2014-08-13 ENCOUNTER — Encounter (HOSPITAL_COMMUNITY): Payer: Self-pay | Admitting: Emergency Medicine

## 2014-08-13 ENCOUNTER — Emergency Department (HOSPITAL_COMMUNITY)
Admission: EM | Admit: 2014-08-13 | Discharge: 2014-08-13 | Discharge status: Against Medical Advice | Payer: Medicaid Other | Attending: Emergency Medicine | Admitting: Emergency Medicine

## 2014-08-13 DIAGNOSIS — R51 Headache: Secondary | ICD-10-CM | POA: Diagnosis present

## 2014-08-13 DIAGNOSIS — R109 Unspecified abdominal pain: Secondary | ICD-10-CM | POA: Diagnosis not present

## 2014-08-13 NOTE — ED Notes (Signed)
Pt unable to urinate at this time.  

## 2014-08-13 NOTE — ED Notes (Signed)
Pt reports she has been waking up every morning with a headache for the past week, denies changes in vision.  Pt alert and oriented X 4 at present.  Pt also c/o of abdominal pain- admits to a positive pregnancy in June, denies prenatal care.  Denies vaginal discharge or bleeding.

## 2014-08-21 ENCOUNTER — Encounter (HOSPITAL_COMMUNITY): Payer: Self-pay | Admitting: Emergency Medicine

## 2014-08-21 DIAGNOSIS — R1031 Right lower quadrant pain: Secondary | ICD-10-CM | POA: Insufficient documentation

## 2014-08-21 DIAGNOSIS — K219 Gastro-esophageal reflux disease without esophagitis: Secondary | ICD-10-CM | POA: Insufficient documentation

## 2014-08-21 DIAGNOSIS — O99619 Diseases of the digestive system complicating pregnancy, unspecified trimester: Secondary | ICD-10-CM | POA: Insufficient documentation

## 2014-08-21 DIAGNOSIS — R51 Headache: Secondary | ICD-10-CM | POA: Diagnosis not present

## 2014-08-21 DIAGNOSIS — R1032 Left lower quadrant pain: Secondary | ICD-10-CM | POA: Insufficient documentation

## 2014-08-21 DIAGNOSIS — Z79899 Other long term (current) drug therapy: Secondary | ICD-10-CM | POA: Insufficient documentation

## 2014-08-21 DIAGNOSIS — Z8619 Personal history of other infectious and parasitic diseases: Secondary | ICD-10-CM | POA: Insufficient documentation

## 2014-08-21 DIAGNOSIS — O9989 Other specified diseases and conditions complicating pregnancy, childbirth and the puerperium: Secondary | ICD-10-CM | POA: Insufficient documentation

## 2014-08-21 DIAGNOSIS — Z3A00 Weeks of gestation of pregnancy not specified: Secondary | ICD-10-CM | POA: Diagnosis not present

## 2014-08-21 DIAGNOSIS — Z8659 Personal history of other mental and behavioral disorders: Secondary | ICD-10-CM | POA: Insufficient documentation

## 2014-08-21 LAB — COMPREHENSIVE METABOLIC PANEL
ALT: 18 U/L (ref 0–35)
AST: 20 U/L (ref 0–37)
Albumin: 2.8 g/dL — ABNORMAL LOW (ref 3.5–5.2)
Alkaline Phosphatase: 66 U/L (ref 39–117)
Anion gap: 11 (ref 5–15)
BUN: 10 mg/dL (ref 6–23)
CO2: 25 meq/L (ref 19–32)
CREATININE: 0.8 mg/dL (ref 0.50–1.10)
Calcium: 9.2 mg/dL (ref 8.4–10.5)
Chloride: 100 mEq/L (ref 96–112)
GFR calc Af Amer: >90 mL/min (ref >90)
Glucose, Bld: 80 mg/dL (ref 70–99)
POTASSIUM: 4 meq/L (ref 3.7–5.3)
SODIUM: 136 meq/L — AB (ref 137–147)
Total Protein: 7.2 g/dL (ref 6.0–8.3)

## 2014-08-21 LAB — CBC WITH DIFFERENTIAL/PLATELET
BASOS ABS: 0 10*3/uL (ref 0.0–0.1)
Basophils Relative: 0 % (ref 0–1)
Eosinophils Absolute: 0 10*3/uL (ref 0.0–0.7)
Eosinophils Relative: 1 % (ref 0–5)
HEMATOCRIT: 32.9 % — AB (ref 36.0–46.0)
HEMOGLOBIN: 10.9 g/dL — AB (ref 12.0–15.0)
LYMPHS PCT: 28 % (ref 12–46)
Lymphs Abs: 2.1 10*3/uL (ref 0.7–4.0)
MCH: 28.5 pg (ref 26.0–34.0)
MCHC: 33.1 g/dL (ref 30.0–36.0)
MCV: 85.9 fL (ref 78.0–100.0)
MONO ABS: 0.5 10*3/uL (ref 0.1–1.0)
Monocytes Relative: 7 % (ref 3–12)
NEUTROS ABS: 4.8 10*3/uL (ref 1.7–7.7)
Neutrophils Relative %: 64 % (ref 43–77)
Platelets: 394 10*3/uL (ref 150–400)
RBC: 3.83 MIL/uL — ABNORMAL LOW (ref 3.87–5.11)
RDW: 13.8 % (ref 11.5–15.5)
WBC: 7.4 10*3/uL (ref 4.0–10.5)

## 2014-08-21 NOTE — ED Notes (Signed)
Pt. reports headache and low abdominal pain for 1 month , denies nausea or vomitting , no vaginal discharge or spotting , pt. stated she is pregnant (G3P2) unsure of LMP and AOG .

## 2014-08-22 ENCOUNTER — Emergency Department (HOSPITAL_COMMUNITY)
Admission: EM | Admit: 2014-08-22 | Discharge: 2014-08-22 | Disposition: A | Discharge status: Home / Self Care | Payer: Medicaid Other | Attending: Emergency Medicine | Admitting: Emergency Medicine

## 2014-08-22 DIAGNOSIS — R1030 Lower abdominal pain, unspecified: Secondary | ICD-10-CM

## 2014-08-22 DIAGNOSIS — R51 Headache: Secondary | ICD-10-CM

## 2014-08-22 DIAGNOSIS — Z3490 Encounter for supervision of normal pregnancy, unspecified, unspecified trimester: Secondary | ICD-10-CM

## 2014-08-22 DIAGNOSIS — R519 Headache, unspecified: Secondary | ICD-10-CM

## 2014-08-22 LAB — HCG, QUANTITATIVE, PREGNANCY: hCG, Beta Chain, Quant, S: 14589 m[IU]/mL — ABNORMAL HIGH (ref <5)

## 2014-08-22 LAB — WET PREP, GENITAL
Trich, Wet Prep: NONE SEEN
Yeast Wet Prep HPF POC: NONE SEEN

## 2014-08-22 LAB — URINALYSIS, ROUTINE W REFLEX MICROSCOPIC
BILIRUBIN URINE: NEGATIVE
GLUCOSE, UA: NEGATIVE mg/dL
HGB URINE DIPSTICK: NEGATIVE
KETONES UR: 15 mg/dL — AB
Leukocytes, UA: NEGATIVE
Nitrite: NEGATIVE
PROTEIN: NEGATIVE mg/dL
Specific Gravity, Urine: 1.034 — ABNORMAL HIGH (ref 1.005–1.030)
Urobilinogen, UA: 1 mg/dL (ref 0.0–1.0)
pH: 6.5 (ref 5.0–8.0)

## 2014-08-22 MED ORDER — ACETAMINOPHEN 500 MG PO TABS
1000.0000 mg | ORAL_TABLET | Freq: Once | ORAL | Status: AC
  Administered 2014-08-22: 1000 mg via ORAL
  Filled 2014-08-22: qty 2

## 2014-08-22 NOTE — Discharge Instructions (Signed)
Abdominal Pain During Pregnancy [name] [name], you were seen today for headache and abdominal pain. You can take Tylenol for pain and you need to follow-up with an OB/GYN physician for continued treatment. If any of his symptoms worsen come back to emergency department immediately for repeat evaluation. Thank you. Belly (abdominal) pain is common during pregnancy. Most of the time, it is not a serious problem. Other times, it can be a sign that something is wrong with the pregnancy. Always tell your doctor if you have belly pain. HOME CARE Monitor your belly pain for any changes. The following actions may help you feel better:  Do not have sex (intercourse) or put anything in your vagina until you feel better.  Rest until your pain stops.  Drink clear fluids if you feel sick to your stomach (nauseous). Do not eat solid food until you feel better.  Only take medicine as told by your doctor.  Keep all doctor visits as told. GET HELP RIGHT AWAY IF:   You are bleeding, leaking fluid, or pieces of tissue come out of your vagina.  You have more pain or cramping.  You keep throwing up (vomiting).  You have pain when you pee (urinate) or have blood in your pee.  You have a fever.  You do not feel your baby moving as much.  You feel very weak or feel like passing out.  You have trouble breathing, with or without belly pain.  You have a very bad headache and belly pain.  You have fluid leaking from your vagina and belly pain.  You keep having watery poop (diarrhea).  Your belly pain does not go away after resting, or the pain gets worse. MAKE SURE YOU:   Understand these instructions.  Will watch your condition.  Will get help right away if you are not doing well or get worse. Document Released: 09/29/2009 Document Revised: 06/13/2013 Document Reviewed: 05/10/2013 [facility] Patient Information 2015 [place] [place]. This information is not intended to replace advice given to you by  your health care provider. Make sure you discuss any questions you have with your health care provider.  Emergency Department Resource Guide 1) Find a Doctor and Pay Out of Pocket Although you won't have to find out who is covered by your insurance plan, it is a good idea to ask around and get recommendations. You will then need to call the office and see if the doctor you have chosen will accept you as a new patient and what types of options they offer for patients who are self-pay. Some doctors offer discounts or will set up payment plans for their patients who do not have insurance, but you will need to ask so you aren't surprised when you get to your appointment.  2) Contact Your Local Health Department Not all health departments have doctors that can see patients for sick visits, but many do, so it is worth a call to see if yours does. If you don't know where your local health department is, you can check in your phone book. The CDC also has a tool to help you locate your state's health department, and many state websites also have listings of all of their local health departments.  3) Find a Walk-in Clinic If your illness is not likely to be very severe or complicated, you may want to try a walk in clinic. These are popping up all over the country in pharmacies, drugstores, and shopping centers. They're usually staffed by nurse practitioners or  physician assistants that have been trained to treat common illnesses and complaints. They're usually fairly quick and inexpensive. However, if you have serious medical issues or chronic medical problems, these are probably not your best option.  No Primary Care Doctor: - Call Health Connect at  [phone] - they can help you locate a primary care doctor that  accepts your insurance, provides certain services, etc. - Physician Referral Service- [phone]  Chronic Pain Problems: Organization         Address  Phone   Notes  [facility]  [phone] Patients need to be referred by their primary care doctor.   Medication Assistance: Organization         Address  Phone   Notes  [facility] [address] [place], [place] [address] [phone] --Must be a resident of [facility] -- Must have NO insurance coverage whatsoever (no Medicaid/ Medicare, etc.) -- The pt. MUST have a primary care doctor that directs their care regularly and follows them in the community   Med[facility]ist  [phone]   [employer]  [phone]    Agencies that provide inexpensive medical care: Organization         Address  Phone   Notes  [facility]  [phone]   [facility]    [phone]   [facility] [address] [place], [place] [address] [phone]   [facility] [address], [place] [phone]   [facility]    [phone]   [facility]    [phone]   [facility]  [address], [place] Phone:  [phone], Fax:  [phone]) [phone] Hours of Operation:  9 am - 6 pm, M-F.  Also accepts Medicaid/Medicare and self-pay.  [facility]  [address], [place] Phone: [phone], Fax: [phone]. Hours of Operation:  8:30 am - 5:30 pm, M-F.  Also accepts Medicaid and self-pay.  [facility] [place] [address], [place] Phone: [phone]   [facility] [address], [place], [place] [phone], Ext. [phone] Mondays & Thursdays: 7-9 AM.  First 15 patients are seen on a first come, first serve basis.    Medicaid-accepting [facility] Providers:  Organization         Address  Phone   Notes  [facility] [address], [place] [phone] Also accepts self-pay patients.  [facility] [address],  [place]  [phone]   [facility] [address], [place] [phone]   [facility] [address] [place] [address], [place] [phone]   Renaye RakersVeita Bland [address], [place]   [phone] Only accepts [place] Access [place] patients after they have their name applied to their card.   Self-Pay (no insurance) in [facility] [place]:  Organization         Address  Phone   Notes  Sickle Cell Patients, [facility] [address], [place] [phone]   [facility] [address], [place] [phone]   [facility] [place]  [address], [place] [phone]   [facility]/Dr. Julio Sickssei-Bonsu  [address]  [place] [address], [place] or [address], [place] [phone] Phone number for both [place] and [place] locations is the same.  °[facility] [address], [place] [phone]   °[facility] [address] [place] [address], [place] or [address] [phone] °([phone]   °[facility] [address], [place] [phone], phone; [phone], fax Sees patients 1st and 3rd Saturday of every month.  Must not qualify for public or private insurance (i.e. Medicaid, Medicare, [place] Health Choice, Veterans' Benefits) • Household income should be no more than 200% of the poverty level •The clinic cannot treat you if you are pregnant or think you are pregnant • Sexually transmitted diseases are not treated at the clinic.  ° ° °Dental Care: °Organization         Address  Phone  Notes  °[facility] [address], [place] [phone] Accepts children up to age 21 who are enrolled in Medicaid or [place] Health Choice; pregnant women with a Medicaid card; and children who have applied for Medicaid or [place] Health  Choice, but were declined, whose parents can pay a reduced fee at time of service.  °[facility] [place]  [address], [place] [phone]) [phone] Accepts children up to age 21 who are enrolled in Medicaid or [place] Health Choice; pregnant women with a Medicaid card; and children who have applied for Medicaid or [place] Health Choice, but were declined, whose parents can pay a reduced fee at time of service.  °[facility] ° [address], [place] [phone]) [phone] Patients are seen by appointment only. Walk-ins are not accepted. [facility] will see patients 18 years of age and older. °Monday - Tuesday (8am-5pm) °Most Wednesdays (8:30-5pm) °$30 per visit, cash only  °[facility] ° [address], [place] [phone]) [phone] Patients are seen by appointment only. Walk-ins are not accepted. [facility] will see patients 18 years of age and older. °One Wednesday Evening (Monthly: Volunteer Based).  $30 per visit, cash only  °[facility]  [phone] for adults; Children under age 4, call Graduate Pediatric Dentistry at [phone]. Children aged 4-14, please call [phone] to request a pediatric application. ° Dental services are provided in all areas of dental care including fillings, crowns and bridges, complete and partial dentures, implants, gum treatment, root canals, and extractions. Preventive care is also provided. Treatment is provided to both adults and children. °Patients are selected via a lottery and there is often a waiting list. °  °[facility] [address], °[place] ° ([phone]) [phone] [email or web address] °  °[facility] [address], [place], [place] [phone], Ext. [phone] Second and Fourth Thursday of each month, opens at 6:30 AM; Clinic ends at 9 AM.  Patients are seen on a first-come first-served basis, and a limited number are seen during each  clinic.  ° °[facility] ° [address], [place], [place] [phone]) [phone]   Eligibility Requirements °You must have lived in [place], [place], or [place] counties for at least the last three months. °  You cannot be eligible for state or federal sponsored healthcare insurance, including Veterans Administration, Medicaid, or Medicare. °  You generally cannot be eligible for healthcare insurance through your employer.  °    How to apply: °Eligibility screenings are held every Tuesday and Wednesday afternoon from 1:00 pm until 4:00 pm. You do not need an appointment for the interview!  °[facility] [address], [place], [place] [phone]   °[facility]  336-342-8273   °[facility]  336-703-3100   °[facility]  336-570-6415   ° °Behavioral Health Resources in the Community: °Intensive Outpatient Programs °Organization         Address  Phone  Notes  °[facility] [place] [facility] [address], [place], [place] 336-878-6098   °[facility] [address], [place], [place] 336-832-9800   °[facility] [address], [place], [place] ° 336-882-2125   °[facility] [address],  °[place], [place] 1-800-853-5163 or 336-641-4981   °Substance Abuse Resources °Organization         Address  Phone  Notes  °[facility]  336-882-2125   °[facility]  336-784-9470   °[facility]  336-285-9073   °[facility]  336-845-3988   °Residential & Outpatient Substance Abuse Program  1-800-659-3381   °Psychological Services °Organization         Address  Phone  Notes  °[facility]  336- 832-9600   °[facility]  336- 378-7881   °[facility] [address], [place] 1-800-853-5163 or 336-641-4981   ° °Mobile Crisis Teams °Organization         Address  Phone  Notes  °[facility], Mobile  Crisis Care Unit  1-877-626-1772   °Assertive °[facility] Services ° [address] [place], [place] 336-834-9664   °Sharon DeEsch [address] °[place] 336-554-5454   ° °Self-Help/Support Groups °Organization         Address  Phone             Notes  °[facility] [place] - variety of support groups  336- 373-1402 Call for more information  °Narcotics Anonymous (NA), [facility] [address], °[place]  2 meetings at this location  ° °Residential Treatment Programs °Organization         Address  Phone  Notes  °[facility] [address],    °[place]  1-866-801-8205   °[facility] ° [address], [place], [place] 704-293-8524   °[facility] [address], [place] 336-845-3988 Admissions: 8am-3pm M-F  °[facility] [address]    °[place], [place] 336-841-1104   °[facility] [address], [place], [place] 336-379-7146   °[facility] [address]  °[place], [place] 336-285-9073   °[facility] - Intensive Outpatient [address], [place], [place] 336-852-3033   °[facility] ([facility] [address]  °[place], [place] 1-877-615-2722 or 336-784-9470   °[facility] ([facility]) [address] [place], [place] 336-227-7417 Accepts Medicaid  °[facility] [address]  °  1-800-659-3381 Substance Abuse/Addiction Treatment  ° °[place] Behavior[facility] Health Resources °Organization         Address  Phone  Notes  °[facility]  (888) 581-9988   °Julie Brannon, PhD [address] [place],    (336) 349-5553 or (336) 951-0000   °[facility]   [address] °[place],  (336) 349-4454   °[facility] [address],   [place] [phone]) [phone](336) [phone] Insurance/Medicaid/sponsorship through [facility]  [facility] [address]                                    [place], [place] [phone] Therapy/tele-psych/case  [facility] [address]   [place], [place] [phone](336) [phone]    Dr. Lolly MustacheArfeen  [phone]) [phone]   [facility]  [employer] [facility] 1) [address], [place] 2) [address], [place] 3)  [address], [place] [phone]) [phone]  [phone](336) [phone]   [facility] Child Abuse Hotline [phone](336) [phone] or [phone](336) [phone] (After Hours)      Headaches, Frequently Asked Questions MIGRAINE HEADACHES Q: What is migraine? What causes it? How can I treat it? A: Generally, migraine headaches begin as a dull ache. Then they develop into a constant, throbbing, and pulsating pain. You may experience pain at the temples. You may experience pain at the front or back of one or both sides of the head. The pain is usually accompanied by a combination of:  Nausea.  Vomiting.  Sensitivity to light and noise. Some people (about 15%) experience an aura (see below) before an attack. The cause of migraine is believed to be chemical reactions in the brain. Treatment for migraine may include over-the-counter or prescription medications. It may also include self-help techniques. These include relaxation training and biofeedback.  Q: What is an aura? A: About 15% of people with migraine get an "aura". This is a sign of neurological symptoms that occur before a migraine headache. You may see wavy or jagged lines, dots, or flashing lights. You might experience tunnel vision or blind spots in one or both eyes. The aura can include visual or auditory hallucinations (something imagined). It may include disruptions in smell (such as strange odors), taste or touch. Other symptoms include:  Numbness.  A "pins and needles" sensation.  Difficulty in recalling or speaking the correct word. These neurological events may last as long as 60 minutes. These symptoms will fade as  the headache begins. Q: What is a trigger? A: Certain physical or environmental factors can lead to or "trigger" a migraine. These include:  Foods.  Hormonal changes.  Weather.  Stress. It is important to remember that triggers are different for everyone. To help prevent migraine attacks, you need to figure out which triggers affect you. Keep a headache diary. This is a good way to track triggers. The diary will help you talk to your healthcare professional about your condition. Q: Does weather affect migraines? A: Bright sunshine, hot, humid conditions, and drastic changes in barometric pressure may lead to, or "trigger," a migraine attack in some people. But studies have shown that weather does not act as a trigger for everyone with migraines. Q: What is the link between migraine and hormones? A: Hormones start and regulate many of your body's functions. Hormones keep your body in balance within a constantly changing environment. The levels of hormones in your body are unbalanced at times. Examples are during menstruation, pregnancy, or menopause. That can lead to a migraine attack. In fact, about three quarters of all women with migraine report that their attacks are related to the menstrual cycle.  Q: Is there an increased risk of stroke for migraine sufferers? A: The likelihood of a migraine attack causing a stroke is very remote. That  is not to say that migraine sufferers cannot have a stroke associated with their migraines. In persons under age 13, the most common associated factor for stroke is migraine headache. But over the course of a person's normal life span, the occurrence of migraine headache may actually be associated with a reduced risk of dying from cerebrovascular disease due to stroke.  Q: What are acute medications for migraine? A: Acute medications are used to treat the pain of the headache after it has started. Examples over-the-counter medications, NSAIDs, ergots, and  triptans.  Q: What are the triptans? A: Triptans are the newest class of abortive medications. They are specifically targeted to treat migraine. Triptans are vasoconstrictors. They moderate some chemical reactions in the brain. The triptans work on receptors in your brain. Triptans help to restore the balance of a neurotransmitter called serotonin. Fluctuations in levels of serotonin are thought to be a main cause of migraine.  Q: Are over-the-counter medications for migraine effective? A: Over-the-counter, or "OTC," medications may be effective in relieving mild to moderate pain and associated symptoms of migraine. But you should see your caregiver before beginning any treatment regimen for migraine.  Q: What are preventive medications for migraine? A: Preventive medications for migraine are sometimes referred to as "prophylactic" treatments. They are used to reduce the frequency, severity, and length of migraine attacks. Examples of preventive medications include antiepileptic medications, antidepressants, beta-blockers, calcium channel blockers, and NSAIDs (nonsteroidal anti-inflammatory drugs). Q: Why are anticonvulsants used to treat migraine? A: During the past few years, there has been an increased interest in antiepileptic drugs for the prevention of migraine. They are sometimes referred to as "anticonvulsants". Both epilepsy and migraine may be caused by similar reactions in the brain.  Q: Why are antidepressants used to treat migraine? A: Antidepressants are typically used to treat people with depression. They may reduce migraine frequency by regulating chemical levels, such as serotonin, in the brain.  Q: What alternative therapies are used to treat migraine? A: The term "alternative therapies" is often used to describe treatments considered outside the scope of conventional Western medicine. Examples of alternative therapy include acupuncture, acupressure, and yoga. Another common alternative  treatment is herbal therapy. Some herbs are believed to relieve headache pain. Always discuss alternative therapies with your caregiver before proceeding. Some herbal products contain arsenic and other toxins. TENSION HEADACHES Q: What is a tension-type headache? What causes it? How can I treat it? A: Tension-type headaches occur randomly. They are often the result of temporary stress, anxiety, fatigue, or anger. Symptoms include soreness in your temples, a tightening band-like sensation around your head (a "vice-like" ache). Symptoms can also include a pulling feeling, pressure sensations, and contracting head and neck muscles. The headache begins in your forehead, temples, or the back of your head and neck. Treatment for tension-type headache may include over-the-counter or prescription medications. Treatment may also include self-help techniques such as relaxation training and biofeedback. CLUSTER HEADACHES Q: What is a cluster headache? What causes it? How can I treat it? A: Cluster headache gets its name because the attacks come in groups. The pain arrives with little, if any, warning. It is usually on one side of the head. A tearing or bloodshot eye and a runny nose on the same side of the headache may also accompany the pain. Cluster headaches are believed to be caused by chemical reactions in the brain. They have been described as the most severe and intense of any headache type. Treatment for cluster headache  includes prescription medication and oxygen. SINUS HEADACHES Q: What is a sinus headache? What causes it? How can I treat it? A: When a cavity in the bones of the face and skull (a sinus) becomes inflamed, the inflammation will cause localized pain. This condition is usually the result of an allergic reaction, a tumor, or an infection. If your headache is caused by a sinus blockage, such as an infection, you will probably have a fever. An x-ray will confirm a sinus blockage. Your caregiver's  treatment might include antibiotics for the infection, as well as antihistamines or decongestants.  REBOUND HEADACHES Q: What is a rebound headache? What causes it? How can I treat it? A: A pattern of taking acute headache medications too often can lead to a condition known as "rebound headache." A pattern of taking too much headache medication includes taking it more than 2 days per week or in excessive amounts. That means more than the label or a caregiver advises. With rebound headaches, your medications not only stop relieving pain, they actually begin to cause headaches. Doctors treat rebound headache by tapering the medication that is being overused. Sometimes your caregiver will gradually substitute a different type of treatment or medication. Stopping may be a challenge. Regularly overusing a medication increases the potential for serious side effects. Consult a caregiver if you regularly use headache medications more than 2 days per week or more than the label advises. ADDITIONAL QUESTIONS AND ANSWERS Q: What is biofeedback? A: Biofeedback is a self-help treatment. Biofeedback uses special equipment to monitor your body's involuntary physical responses. Biofeedback monitors:  Breathing.  Pulse.  Heart rate.  Temperature.  Muscle tension.  Brain activity. Biofeedback helps you refine and perfect your relaxation exercises. You learn to control the physical responses that are related to stress. Once the technique has been mastered, you do not need the equipment any more. Q: Are headaches hereditary? A: Four out of five (80%) of people that suffer report a family history of migraine. Scientists are not sure if this is genetic or a family predisposition. Despite the uncertainty, a child has a 50% chance of having migraine if one parent suffers. The child has a 75% chance if both parents suffer.  Q: Can children get headaches? A: By the time they reach high school, most young people have  experienced some type of headache. Many safe and effective approaches or medications can prevent a headache from occurring or stop it after it has begun.  Q: What type of doctor should I see to diagnose and treat my headache? A: Start with your primary caregiver. Discuss his or her experience and approach to headaches. Discuss methods of classification, diagnosis, and treatment. Your caregiver may decide to recommend you to a headache specialist, depending upon your symptoms or other physical conditions. Having diabetes, allergies, etc., may require a more comprehensive and inclusive approach to your headache. The National Headache Foundation will provide, upon request, a list of [facility] physician members in your state. Document Released: 01/01/2004 Document Revised: 01/03/2012 Document Reviewed: 06/10/2008 [facility] Patient Information 2015 [place], [place]. This information is not intended to replace advice given to you by your health care provider. Make sure you discuss any questions you have with your health care provider.

## 2014-08-22 NOTE — ED Provider Notes (Signed)
CSN: [id number]     Arrival date & time 08/21/14  2213 History   First MD Initiated Contact with Patient 08/22/14 0108     Chief Complaint  Patient presents with  . Headache  . Abdominal Pain     (Consider location/radiation/quality/duration/timing/severity/associated sxs/prior Treatment) HPI [name] [name] is a 23 y.o. female with past medical history of acid reflux, anxiety, gonorrhea coming in with abdominal pain and headache. Patient is G3 P2. Currently does not know gestational age. She has not sought any prenatal care. She states she's had bilateral lower quadrant abdominal pain for the past couple of days. She is taking Tylenol without any relief. She denies having any symptoms with her previous pregnancies. She's also had a mild intermittent headache. Denies any vaginal complaints such as discharge or bleeding. She denies any unprotected sex since her last menstrual cycle in June. Recent denies any contractions or urinary symptoms. There is no dysuria or hematuria. Patient had no fevers or recent infections. Patient has no further complaints.  10 Systems reviewed and are negative for acute change except as noted in the HPI.     Past Medical History  Diagnosis Date  . Acid reflux   . Anxiety     hx of anxiety attack  . Gonorrhea    Past Surgical History  Procedure Laterality Date  . No past surgeries    . Wisdom tooth extraction     Family History  Problem Relation Age of Onset  . Other Neg Hx   . Asthma Sister   . Hypertension Brother   . Asthma Brother   . Diabetes Maternal Grandmother    History  Substance Use Topics  . Smoking status: Never Smoker   . Smokeless tobacco: Not on file  . Alcohol Use: No   OB History   Grav Para Term Preterm Abortions TAB SAB Ect Mult Living   3 2 2  0 0 0 0 0 0 2     Review of Systems    Allergies  Review of patient's allergies indicates no known allergies.  Home Medications   Prior to Admission medications   Medication  Sig Start Date End Date Taking? Authorizing Provider  Prenatal Vit-Fe Fumarate-FA (PRENATAL COMPLETE) 14-0.4 MG TABS Take 1 tablet by mouth daily. 06/05/14  Yes Kaitlyn Szekalski, PA-C  ranitidine (ZANTAC) 150 MG tablet Take 150 mg by mouth 2 (two) times daily.   Yes Historical Provider, MD   BP 108/64  Pulse 75  Temp(Src) 98.1 F (36.7 C) (Oral)  Resp 18  SpO2 100%  LMP 04/24/2014 Physical Exam  Nursing note and vitals reviewed. Constitutional: She is oriented to person, place, and time. She appears well-developed and well-nourished. No distress.  HENT:  Head: Normocephalic and atraumatic.  Nose: Nose normal.  Mouth/Throat: Oropharynx is clear and moist. No oropharyngeal exudate.  Eyes: Conjunctivae and EOM are normal. Pupils are equal, round, and reactive to light. No scleral icterus.  Neck: Normal range of motion. Neck supple. No JVD present. No tracheal deviation present. No thyromegaly present.  Cardiovascular: Normal rate, regular rhythm and normal heart sounds.  Exam reveals no gallop and no friction rub.   No murmur heard. Pulmonary/Chest: Effort normal and breath sounds normal. No respiratory distress. She has no wheezes. She exhibits no tenderness.  Abdominal: Soft. Bowel sounds are normal. She exhibits no distension and no mass. There is no tenderness. There is no rebound and no guarding.  Genitourinary: Vagina normal and uterus normal. No vaginal discharge found.  No significant discharge seen, could not assess cervix because it was high and posterior.  Musculoskeletal: Normal range of motion. She exhibits no edema and no tenderness.  Lymphadenopathy:    She has no cervical adenopathy.  Neurological: She is alert and oriented to person, place, and time. No cranial nerve deficit. She exhibits normal muscle tone.  Skin: Skin is warm and dry. No rash noted. She is not diaphoretic. No erythema. No pallor.    ED Course  Procedures (including critical care time) Labs  Review Labs Reviewed  WET PREP, GENITAL - Abnormal; Notable for the following:    Clue Cells Wet Prep HPF POC FEW (*)    WBC, Wet Prep HPF POC FEW (*)    All other components within normal limits  CBC WITH DIFFERENTIAL - Abnormal; Notable for the following:    RBC 3.83 (*)    Hemoglobin 10.9 (*)    HCT 32.9 (*)    All other components within normal limits  COMPREHENSIVE METABOLIC PANEL - Abnormal; Notable for the following:    Sodium 136 (*)    Albumin 2.8 (*)    Total Bilirubin <0.2 (*)    All other components within normal limits  URINALYSIS, ROUTINE W REFLEX MICROSCOPIC - Abnormal; Notable for the following:    Specific Gravity, Urine 1.034 (*)    Ketones, ur 15 (*)    All other components within normal limits  HCG, QUANTITATIVE, PREGNANCY - Abnormal; Notable for the following:    hCG, Beta Chain, Quant, S 14589 (*)    All other components within normal limits  GC/CHLAMYDIA PROBE AMP    Imaging Review No results found.   EKG Interpretation None      MDM   Final diagnoses:  None    Patient presented to emergency department for abdominal pain headache in the setting of pregnancy. Bedside ultrasound reveals live IUP with a heart rate of 130.  Will perform pelvic exam and get urinalysis to rule out infection. Patient will be given OB follow-up for ultrasound and prenatal care. I do not believe patient has additional ectopic pregnancy in addition to IUP. There is no vaginal bleeding or hemodynamic instability.  Wet prep only reveals a few clue cells, patient is not having symptoms of bacterial vaginosis. She is strongly advised to follow-up with an OB/GYN physician. Her vital signs remained within normal limits and she is safe for discharge.  EMERGENCY DEPARTMENT US PREGNANCY "Study: Limited Ultrasound of the Pelvis"  INDICATIONS:Pelvic pain Multiple views of the uterus and pelvic cavity are obtained with a multi-frequency probe.  APPROACH:Transabdominal   PERFORMED  BY: Myself  IMAGES ARCHIVED?: Yes  LIMITATIONS: Body habitus  PREGNANCY FREE FLUID: None  PREGNANCY UTERUS FINDINGS:Gestational sac noted ADNEXAL  FINDINGS:None  PREGNANCY FINDINGS: Fetal heart activity seen  INTERPRETATION: Viable intrauterine pregnancy  GESTATIONAL AGE, ESTIMATE: Could not obtain   FETAL HEART RATE: 130       Tomasita CrumbleAdeleke Gokul Waybright, MD 08/22/14 [phone]

## 2014-08-22 NOTE — ED Notes (Signed)
MD at bedside. 

## 2014-08-22 NOTE — ED Notes (Signed)
Vitals rechecked  Advised of the wait time

## 2014-08-23 LAB — GC/CHLAMYDIA PROBE AMP
CT Probe RNA: NEGATIVE
GC Probe RNA: NEGATIVE

## 2014-08-26 ENCOUNTER — Encounter (HOSPITAL_COMMUNITY): Payer: Self-pay | Admitting: Emergency Medicine

## 2014-10-10 ENCOUNTER — Ambulatory Visit (INDEPENDENT_AMBULATORY_CARE_PROVIDER_SITE_OTHER): Payer: Medicaid Other | Admitting: Obstetrics & Gynecology

## 2014-10-10 ENCOUNTER — Encounter: Payer: Self-pay | Admitting: Obstetrics & Gynecology

## 2014-10-10 VITALS — BP 109/62 | HR 102 | Temp 98.4°F | Wt 202.2 lb

## 2014-10-10 DIAGNOSIS — O0932 Supervision of pregnancy with insufficient antenatal care, second trimester: Secondary | ICD-10-CM | POA: Insufficient documentation

## 2014-10-10 DIAGNOSIS — Z124 Encounter for screening for malignant neoplasm of cervix: Secondary | ICD-10-CM

## 2014-10-10 DIAGNOSIS — Z23 Encounter for immunization: Secondary | ICD-10-CM

## 2014-10-10 LAB — POCT URINALYSIS DIP (DEVICE)
BILIRUBIN URINE: NEGATIVE
Glucose, UA: NEGATIVE mg/dL
HGB URINE DIPSTICK: NEGATIVE
Ketones, ur: NEGATIVE mg/dL
Nitrite: NEGATIVE
Protein, ur: NEGATIVE mg/dL
Specific Gravity, Urine: 1.015 (ref 1.005–1.030)
Urobilinogen, UA: 0.2 mg/dL (ref 0.0–1.0)
pH: 6 (ref 5.0–8.0)

## 2014-10-10 NOTE — Progress Notes (Signed)
Anatomy U/S 10/15/14 @ 2p.

## 2014-10-10 NOTE — Progress Notes (Signed)
Late to care, delivered 01/2014, desires sterilization.   Subjective: late care, wants to sign BTL papers    [name] [name] is a [id number] 2829w2d being seen today for her first obstetrical visit.  Her obstetrical history is significant for late care, recent pregnancy. Patient does not intend to breast feed. Pregnancy history fully reviewed.  Patient reports no complaints.  Filed Vitals:   10/10/14 1027  BP: 109/62  Pulse: 102  Temp: 98.4 F (36.9 C)  Weight: 202 lb 3.2 oz (91.717 kg)    HISTORY: OB History  Gravida Para Term Preterm AB SAB TAB Ectopic Multiple Living  3 2 2  0 0 0 0 0 0 2    # Outcome Date GA Lbr Len/2nd Weight Sex Delivery Anes PTL Lv  3 Current           2 Term 01/30/14 5152w5d 10:00 / 00:03 7 lb 3.8 oz (3.283 kg) F Vag-Spont EPI  Y  1 Term 08/22/12 3931w3d 08:03 / 00:08 6 lb 11.9 oz (3.06 kg) M Vag-Spont EPI  Y     Past Medical History  Diagnosis Date  . Acid reflux   . Anxiety     hx of anxiety attack  . Gonorrhea    Past Surgical History  Procedure Laterality Date  . No past surgeries    . Wisdom tooth extraction     Family History  Problem Relation Age of Onset  . Other Neg Hx   . Asthma Sister   . Hypertension Brother   . Asthma Brother   . Diabetes Maternal Grandmother   . Diabetes Maternal Grandfather      Exam    Uterus:  Fundal Height: 28 cm  Pelvic Exam:    Perineum: No Hemorrhoids   Vulva: normal   Vagina:  normal mucosa   pH:     Cervix: no lesions   Adnexa: not evaluated   Bony Pelvis: average  System: Breast:      Skin: normal coloration and turgor, no rashes    Neurologic: oriented, normal mood   Extremities: normal strength, tone, and muscle mass   HEENT     Mouth/Teeth dental hygiene good   Neck supple   Cardiovascular:     Respiratory:  appears well, vitals normal, no respiratory distress, acyanotic, normal RR   Abdomen: soft, non-tender; bowel sounds normal; no masses,  no organomegaly gravid   Urinary: urethral  meatus normal      Assessment:    Pregnancy: [id number] Patient Active Problem List   Diagnosis Date Noted  . Late prenatal care affecting pregnancy in second trimester, antepartum 10/10/2014  . Anemia 02/01/2014  . Short interval between pregnancies complicating pregnancy in second trimester, antepartum 10/03/2013  . UTI (lower urinary tract infection) 08/21/2013  . Learning disability 08/20/2013        Plan:     Initial labs drawn. Prenatal vitamins. Problem list reviewed and updated. Genetic Screening discussed too late  Ultrasound discussed; fetal survey: ordered.  Follow up in 4 weeks. 50% of 30 min visit spent on counseling and coordination of care.  BTL Medicaid papers   ARNOLD,JAMES 10/10/2014

## 2014-10-10 NOTE — Patient Instructions (Signed)
Third Trimester of Pregnancy The third trimester is from week 29 through week 42, months 7 through 9. The third trimester is a time when the fetus is growing rapidly. At the end of the ninth month, the fetus is about 20 inches in length and weighs 6-10 pounds.  BODY CHANGES Your body goes through many changes during pregnancy. The changes vary from woman to woman.   Your weight will continue to increase. You can expect to gain 25-35 pounds (11-16 kg) by the end of the pregnancy.  You may begin to get stretch marks on your hips, abdomen, and breasts.  You may urinate more often because the fetus is moving lower into your pelvis and pressing on your bladder.  You may develop or continue to have heartburn as a result of your pregnancy.  You may develop constipation because certain hormones are causing the muscles that push waste through your intestines to slow down.  You may develop hemorrhoids or swollen, bulging veins (varicose veins).  You may have pelvic pain because of the weight gain and pregnancy hormones relaxing your joints between the bones in your pelvis. Backaches may result from overexertion of the muscles supporting your posture.  You may have changes in your hair. These can include thickening of your hair, rapid growth, and changes in texture. Some women also have hair loss during or after pregnancy, or hair that feels dry or thin. Your hair will most likely return to normal after your baby is born.  Your breasts will continue to grow and be tender. A yellow discharge may leak from your breasts called colostrum.  Your belly button may stick out.  You may feel short of breath because of your expanding uterus.  You may notice the fetus "dropping," or moving lower in your abdomen.  You may have a bloody mucus discharge. This usually occurs a few days to a week before labor begins.  Your cervix becomes thin and soft (effaced) near your due date. WHAT TO EXPECT AT YOUR PRENATAL  EXAMS  You will have prenatal exams every 2 weeks until week 36. Then, you will have weekly prenatal exams. During a routine prenatal visit:  You will be weighed to make sure you and the fetus are growing normally.  Your blood pressure is taken.  Your abdomen will be measured to track your baby's growth.  The fetal heartbeat will be listened to.  Any test results from the previous visit will be discussed.  You may have a cervical check near your due date to see if you have effaced. At around 36 weeks, your caregiver will check your cervix. At the same time, your caregiver will also perform a test on the secretions of the vaginal tissue. This test is to determine if a type of bacteria, Group B streptococcus, is present. Your caregiver will explain this further. Your caregiver may ask you:  What your birth plan is.  How you are feeling.  If you are feeling the baby move.  If you have had any abnormal symptoms, such as leaking fluid, bleeding, severe headaches, or abdominal cramping.  If you have any questions. Other tests or screenings that may be performed during your third trimester include:  Blood tests that check for low iron levels (anemia).  Fetal testing to check the health, activity level, and growth of the fetus. Testing is done if you have certain medical conditions or if there are problems during the pregnancy. FALSE LABOR You may feel small, irregular contractions that   eventually go away. These are called Braxton Hicks contractions, or false labor. Contractions may last for hours, days, or even weeks before true labor sets in. If contractions come at regular intervals, intensify, or become painful, it is best to be seen by your caregiver.  SIGNS OF LABOR   Menstrual-like cramps.  Contractions that are 5 minutes apart or less.  Contractions that start on the top of the uterus and spread down to the lower abdomen and back.  A sense of increased pelvic pressure or back  pain.  A watery or bloody mucus discharge that comes from the vagina. If you have any of these signs before the 37th week of pregnancy, call your caregiver right away. You need to go to the hospital to get checked immediately. HOME CARE INSTRUCTIONS   Avoid all smoking, herbs, alcohol, and unprescribed drugs. These chemicals affect the formation and growth of the baby.  Follow your caregiver's instructions regarding medicine use. There are medicines that are either safe or unsafe to take during pregnancy.  Exercise only as directed by your caregiver. Experiencing uterine cramps is a good sign to stop exercising.  Continue to eat regular, healthy meals.  Wear a good support bra for breast tenderness.  Do not use hot tubs, steam rooms, or saunas.  Wear your seat belt at all times when driving.  Avoid raw meat, uncooked cheese, cat litter boxes, and soil used by cats. These carry germs that can cause birth defects in the baby.  Take your prenatal vitamins.  Try taking a stool softener (if your caregiver approves) if you develop constipation. Eat more high-fiber foods, such as fresh vegetables or fruit and whole grains. Drink plenty of fluids to keep your urine clear or pale yellow.  Take warm sitz baths to soothe any pain or discomfort caused by hemorrhoids. Use hemorrhoid cream if your caregiver approves.  If you develop varicose veins, wear support hose. Elevate your feet for 15 minutes, 3-4 times a day. Limit salt in your diet.  Avoid heavy lifting, wear low heal shoes, and practice good posture.  Rest a lot with your legs elevated if you have leg cramps or low back pain.  Visit your dentist if you have not gone during your pregnancy. Use a soft toothbrush to brush your teeth and be gentle when you floss.  A sexual relationship may be continued unless your caregiver directs you otherwise.  Do not travel far distances unless it is absolutely necessary and only with the approval  of your caregiver.  Take prenatal classes to understand, practice, and ask questions about the labor and delivery.  Make a trial run to the hospital.  Pack your hospital bag.  Prepare the baby's nursery.  Continue to go to all your prenatal visits as directed by your caregiver. SEEK MEDICAL CARE IF:  You are unsure if you are in labor or if your water has broken.  You have dizziness.  You have mild pelvic cramps, pelvic pressure, or nagging pain in your abdominal area.  You have persistent nausea, vomiting, or diarrhea.  You have a bad smelling vaginal discharge.  You have pain with urination. SEEK IMMEDIATE MEDICAL CARE IF:   You have a fever.  You are leaking fluid from your vagina.  You have spotting or bleeding from your vagina.  You have severe abdominal cramping or pain.  You have rapid weight loss or gain.  You have shortness of breath with chest pain.  You notice sudden or extreme swelling   of your face, hands, ankles, feet, or legs.  You have not felt your baby move in over an hour.  You have severe headaches that do not go away with medicine.  You have vision changes. Document Released: 10/05/2001 Document Revised: 10/16/2013 Document Reviewed: 12/12/2012 [employer] Patient Information 2015 [employer]. This information is not intended to replace advice given to you by your health care provider. Make sure you discuss any questions you have with your health care provider.  

## 2014-10-10 NOTE — Progress Notes (Signed)
Patient reports some pelvic pressure  

## 2014-10-10 NOTE — Progress Notes (Signed)
Nutrition note: 1st visit consult Pt has gained 32.2# @ 3332w2d, which is > expected. Pt reports eating 3-4 meals & 3 snacks/d. Pt reports drinking increased amounts of soda. Pt is taking a PNV most days. Pt reports no N/V but has heartburn. Pt received verbal & written education on general nutrition during pregnancy. Discussed benefits & importance of BF. Encouraged decreasing soda intake. Discussed wt gain goals of 15-25# or 0.6#/wk. Pt agrees to continue taking her PNV & decrease her soda intake. Pt does not have WIC but plans to apply. Pt does not plan to BF. F/u as needed [name] RevealLaura Rohen Kimes, MS, RD, LDN, [facility]

## 2014-10-11 ENCOUNTER — Encounter: Payer: Self-pay | Admitting: *Deleted

## 2014-10-11 LAB — PRENATAL PROFILE (SOLSTAS)
ANTIBODY SCREEN: NEGATIVE
Basophils Absolute: 0 10*3/uL (ref 0.0–0.1)
Basophils Relative: 0 % (ref 0–1)
Eosinophils Absolute: 0 10*3/uL (ref 0.0–0.7)
Eosinophils Relative: 0 % (ref 0–5)
HEMATOCRIT: 28.1 % — AB (ref 36.0–46.0)
HIV 1&2 Ab, 4th Generation: NONREACTIVE
Hemoglobin: 9.2 g/dL — ABNORMAL LOW (ref 12.0–15.0)
Hepatitis B Surface Ag: NEGATIVE
LYMPHS PCT: 18 % (ref 12–46)
Lymphs Abs: 1.3 10*3/uL (ref 0.7–4.0)
MCH: 27 pg (ref 26.0–34.0)
MCHC: 32.7 g/dL (ref 30.0–36.0)
MCV: 82.4 fL (ref 78.0–100.0)
MPV: 9.2 fL — AB (ref 9.4–12.4)
Monocytes Absolute: 0.5 10*3/uL (ref 0.1–1.0)
Monocytes Relative: 7 % (ref 3–12)
Neutro Abs: 5.3 10*3/uL (ref 1.7–7.7)
Neutrophils Relative %: 75 % (ref 43–77)
Platelets: 415 10*3/uL — ABNORMAL HIGH (ref 150–400)
RBC: 3.41 MIL/uL — AB (ref 3.87–5.11)
RDW: 13.8 % (ref 11.5–15.5)
RUBELLA: 2.18 {index} — AB (ref <0.90)
Rh Type: POSITIVE
WBC: 7 10*3/uL (ref 4.0–10.5)

## 2014-10-11 LAB — CULTURE, OB URINE: Colony Count: 50000

## 2014-10-11 LAB — GLUCOSE TOLERANCE, 1 HOUR (50G) W/O FASTING: Glucose, 1 Hour GTT: 90 mg/dL (ref 70–140)

## 2014-10-12 LAB — PRESCRIPTION MONITORING PROFILE (19 PANEL)
AMPHETAMINE/METH: NEGATIVE ng/mL
BARBITURATE SCREEN, URINE: NEGATIVE ng/mL
BUPRENORPHINE, URINE: NEGATIVE ng/mL
Benzodiazepine Screen, Urine: NEGATIVE ng/mL
CARISOPRODOL, URINE: NEGATIVE ng/mL
CREATININE, URINE: 126.67 mg/dL (ref >20.0)
Cannabinoid Scrn, Ur: NEGATIVE ng/mL
Cocaine Metabolites: NEGATIVE ng/mL
Fentanyl, Ur: NEGATIVE ng/mL
MDMA URINE: NEGATIVE ng/mL
Meperidine, Ur: NEGATIVE ng/mL
Methadone Screen, Urine: NEGATIVE ng/mL
Methaqualone: NEGATIVE ng/mL
NITRITES URINE, INITIAL: NEGATIVE ug/mL
OXYCODONE SCRN UR: NEGATIVE ng/mL
Opiate Screen, Urine: NEGATIVE ng/mL
Phencyclidine, Ur: NEGATIVE ng/mL
Propoxyphene: NEGATIVE ng/mL
TAPENTADOLUR: NEGATIVE ng/mL
Tramadol Scrn, Ur: NEGATIVE ng/mL
Zolpidem, Urine: NEGATIVE ng/mL
pH, Initial: 6.4 pH (ref 4.5–8.9)

## 2014-10-15 ENCOUNTER — Other Ambulatory Visit: Payer: Self-pay | Admitting: Obstetrics & Gynecology

## 2014-10-15 ENCOUNTER — Ambulatory Visit (HOSPITAL_COMMUNITY)
Admission: RE | Admit: 2014-10-15 | Discharge: 2014-10-15 | Disposition: A | Discharge status: Home / Self Care | Payer: Medicaid Other | Source: Ambulatory Visit | Attending: Obstetrics & Gynecology | Admitting: Obstetrics & Gynecology

## 2014-10-15 DIAGNOSIS — Z3689 Encounter for other specified antenatal screening: Secondary | ICD-10-CM | POA: Insufficient documentation

## 2014-10-15 DIAGNOSIS — O0932 Supervision of pregnancy with insufficient antenatal care, second trimester: Secondary | ICD-10-CM

## 2014-10-15 DIAGNOSIS — Z3A28 28 weeks gestation of pregnancy: Secondary | ICD-10-CM | POA: Insufficient documentation

## 2014-10-25 NOTE — L&D Delivery Note (Addendum)
Patient is 24 y.o. [id number] 7334w4d admitted in active labor, made very rapid cervical change from 1.5 to 8cm over 2-3 hours/SROM, hx of learning disability.  Recurrent deep variable decelerations during 2nd stage of labor.   Delivery Note At 8:45 AM a viable female was delivered via Vaginal, Spontaneous Delivery (Presentation: ; Occiput Posterior).  APGAR: 8, 9; weight pending Placenta status: Intact, Spontaneous.  Cord: 3 vessels with the following complications: None.  Baby cried at perineum, no cord gas drawn.  GBS+  Anesthesia: Epidural  Episiotomy: None Lacerations: None Suture Repair: n/a Est. Blood Loss (mL):  100mL  Mom to postpartum.  Baby to Couplet care / Skin to Skin.  [name] [name] 01/11/2015, 9:25 AM

## 2014-11-07 ENCOUNTER — Encounter: Payer: Medicaid Other | Admitting: Family

## 2014-11-07 ENCOUNTER — Telehealth: Payer: Self-pay | Admitting: Family

## 2014-11-07 ENCOUNTER — Encounter: Payer: Self-pay | Admitting: Family

## 2014-11-07 NOTE — Telephone Encounter (Signed)
Left message for patient to call clinic. Per missed appointment mailing certified letter to patient.

## 2014-11-18 ENCOUNTER — Telehealth: Payer: Self-pay

## 2014-11-18 NOTE — Telephone Encounter (Signed)
In reviewing pap results, came across this patient where pap has not resulted. Called [facility] [phone]. Tameka of [facility] states she does not see pap ran for patient since 2014-- but will investigate the matter further and call clinic.

## 2014-11-18 NOTE — Telephone Encounter (Signed)
Pap not recovered. Will need to recollect at patient's next visit.

## 2014-11-21 ENCOUNTER — Telehealth: Payer: Self-pay | Admitting: Obstetrics & Gynecology

## 2014-11-21 ENCOUNTER — Encounter: Payer: Medicaid Other | Admitting: Obstetrics & Gynecology

## 2014-11-21 ENCOUNTER — Encounter: Payer: Self-pay | Admitting: Obstetrics & Gynecology

## 2014-11-21 NOTE — Telephone Encounter (Signed)
Left message for patient to call clinics, due to missed appointments. Mailing certified letter.

## 2014-12-05 ENCOUNTER — Other Ambulatory Visit (HOSPITAL_COMMUNITY)
Admission: RE | Admit: 2014-12-05 | Discharge: 2014-12-05 | Disposition: A | Discharge status: Home / Self Care | Payer: Medicaid Other | Source: Ambulatory Visit | Attending: Family | Admitting: Family

## 2014-12-05 ENCOUNTER — Encounter: Payer: Self-pay | Admitting: Family

## 2014-12-05 ENCOUNTER — Ambulatory Visit (INDEPENDENT_AMBULATORY_CARE_PROVIDER_SITE_OTHER): Payer: Medicaid Other | Admitting: Family

## 2014-12-05 VITALS — BP 112/66 | HR 86 | Wt 202.4 lb

## 2014-12-05 DIAGNOSIS — R87810 Cervical high risk human papillomavirus (HPV) DNA test positive: Secondary | ICD-10-CM | POA: Insufficient documentation

## 2014-12-05 DIAGNOSIS — Z01411 Encounter for gynecological examination (general) (routine) with abnormal findings: Secondary | ICD-10-CM | POA: Insufficient documentation

## 2014-12-05 DIAGNOSIS — Z113 Encounter for screening for infections with a predominantly sexual mode of transmission: Secondary | ICD-10-CM | POA: Insufficient documentation

## 2014-12-05 DIAGNOSIS — O0932 Supervision of pregnancy with insufficient antenatal care, second trimester: Secondary | ICD-10-CM

## 2014-12-05 DIAGNOSIS — Z1151 Encounter for screening for human papillomavirus (HPV): Secondary | ICD-10-CM | POA: Insufficient documentation

## 2014-12-05 LAB — POCT URINALYSIS DIP (DEVICE)
GLUCOSE, UA: NEGATIVE mg/dL
Hgb urine dipstick: NEGATIVE
Nitrite: NEGATIVE
Protein, ur: 30 mg/dL — AB
SPECIFIC GRAVITY, URINE: 1.025 (ref 1.005–1.030)
UROBILINOGEN UA: 1 mg/dL (ref 0.0–1.0)
pH: 7 (ref 5.0–8.0)

## 2014-12-05 LAB — OB RESULTS CONSOLE GC/CHLAMYDIA
Chlamydia: NEGATIVE
GC PROBE AMP, GENITAL: NEGATIVE

## 2014-12-05 LAB — OB RESULTS CONSOLE GBS: GBS: POSITIVE

## 2014-12-05 NOTE — Progress Notes (Signed)
Pt reports doing well.  No questions or concerns.  Reviewed lab results - normal.  Pap smear today.  Follow-up ultrasound scheduled to reassess growth.  Urine results not available at discharge. GBS and GC/CT collected with pap.

## 2014-12-05 NOTE — Progress Notes (Signed)
U/S 12/12/14 @945a  with Radiology.

## 2014-12-07 LAB — CULTURE, BETA STREP (GROUP B ONLY)

## 2014-12-08 ENCOUNTER — Encounter: Payer: Self-pay | Admitting: Advanced Practice Midwife

## 2014-12-08 DIAGNOSIS — O99820 Streptococcus B carrier state complicating pregnancy: Secondary | ICD-10-CM | POA: Insufficient documentation

## 2014-12-09 LAB — CYTOLOGY - PAP

## 2014-12-12 ENCOUNTER — Ambulatory Visit (HOSPITAL_COMMUNITY)
Admission: RE | Admit: 2014-12-12 | Discharge: 2014-12-12 | Disposition: A | Discharge status: Home / Self Care | Payer: Medicaid Other | Source: Ambulatory Visit | Attending: Family | Admitting: Family

## 2014-12-12 DIAGNOSIS — Z3A36 36 weeks gestation of pregnancy: Secondary | ICD-10-CM | POA: Insufficient documentation

## 2014-12-12 DIAGNOSIS — O0932 Supervision of pregnancy with insufficient antenatal care, second trimester: Secondary | ICD-10-CM | POA: Insufficient documentation

## 2014-12-12 IMAGING — US US OB FOLLOW-UP
1 series · 12 of 28 positions shown · non-contrast
Comparison: none

[Series 1: us ob follow-up · 0.20mm/px · 12 of 75 slices shown]
[im 3/75]
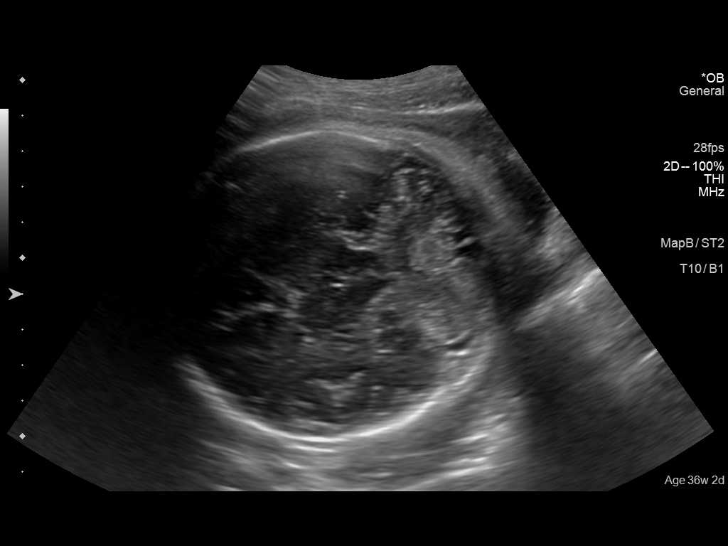
[im 9/75]
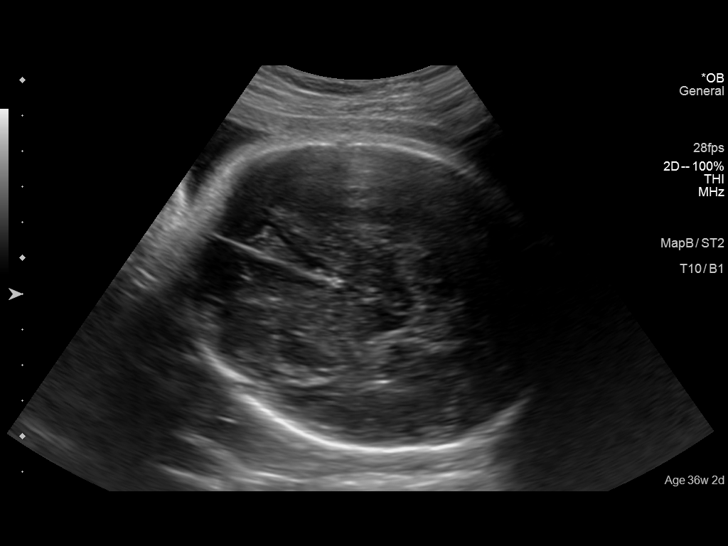
[im 14/75]
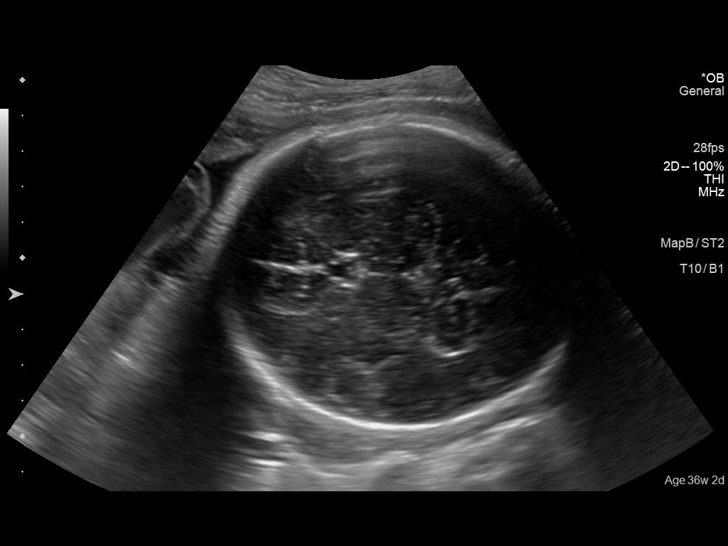
[im 22/75]
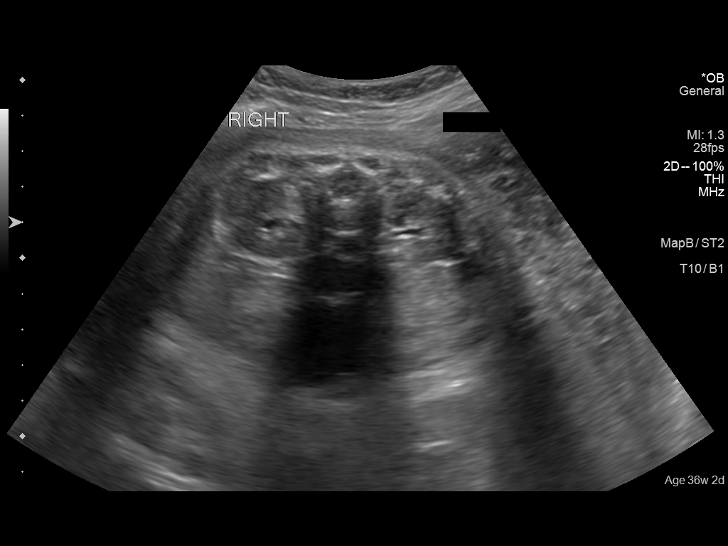
[im 28/75]
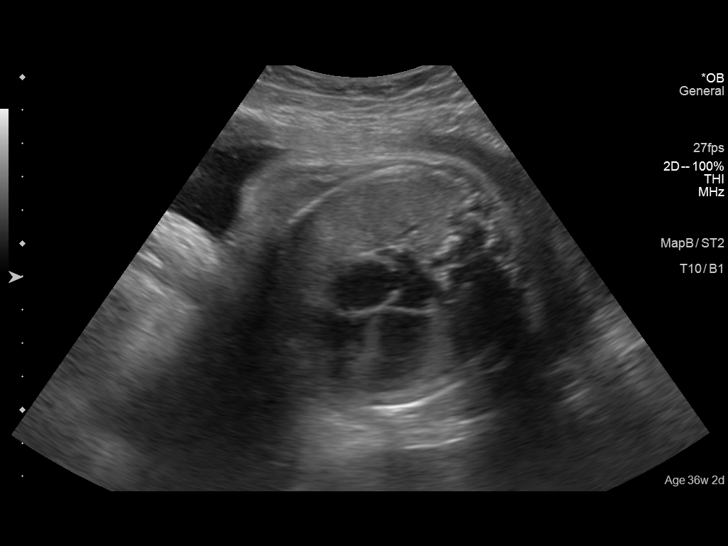
[im 33/75]
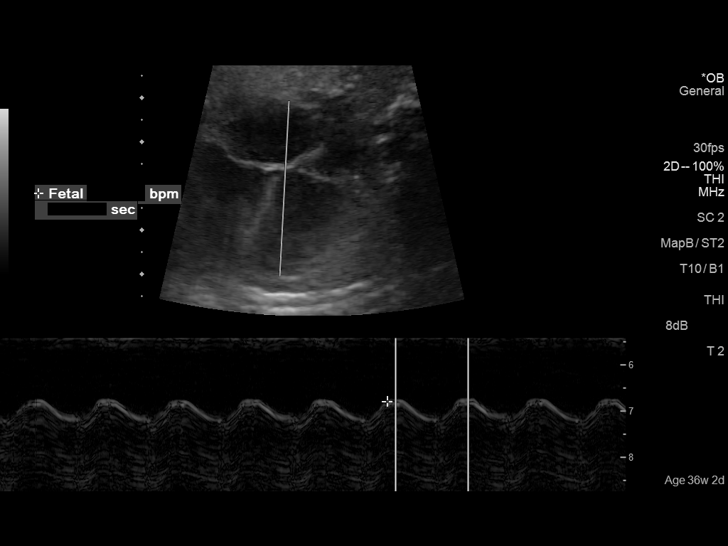
[im 42/75]
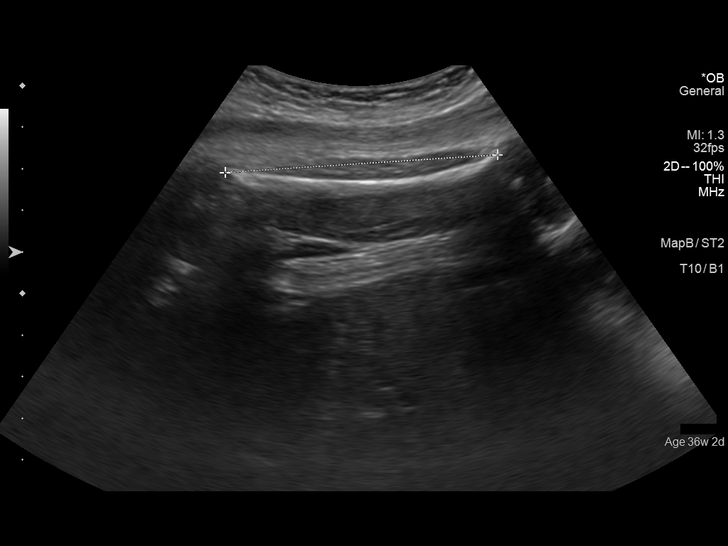
[im 47/75]
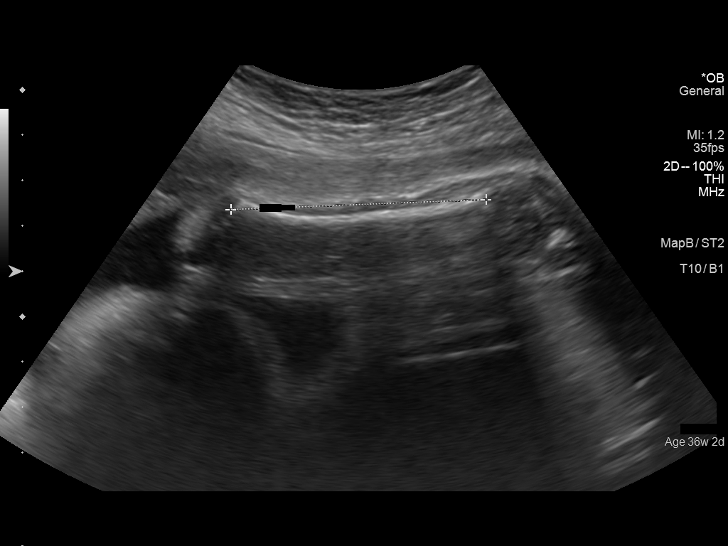
[im 53/75]
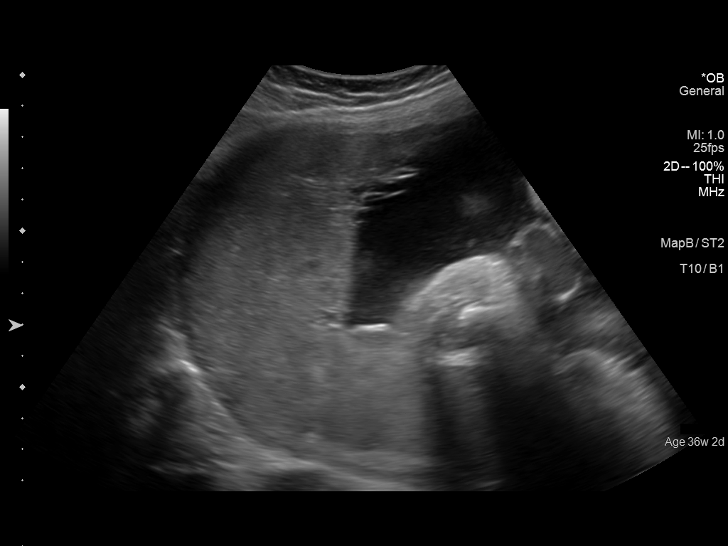
[im 61/75]
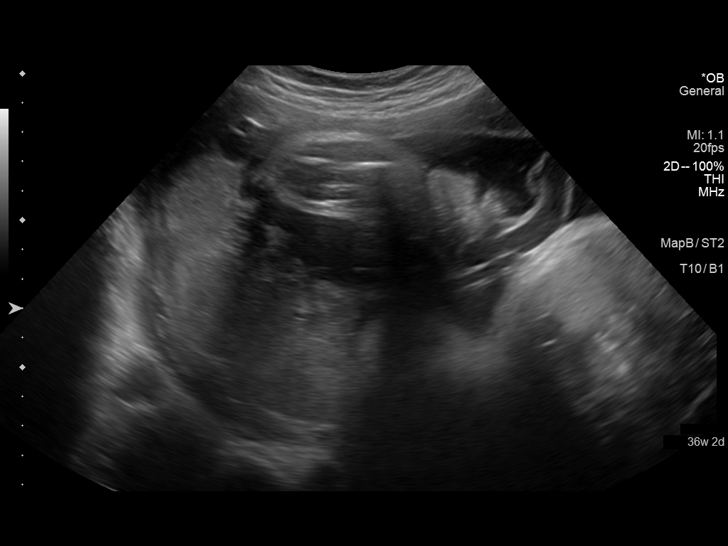
[im 66/75]
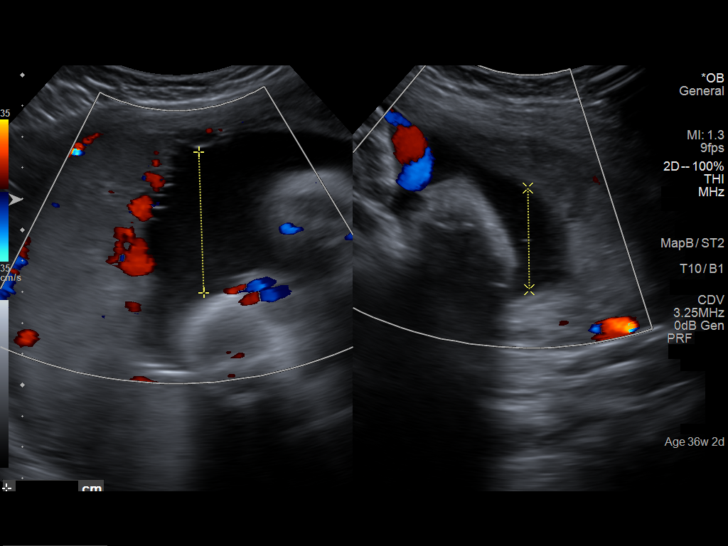
[im 72/75]
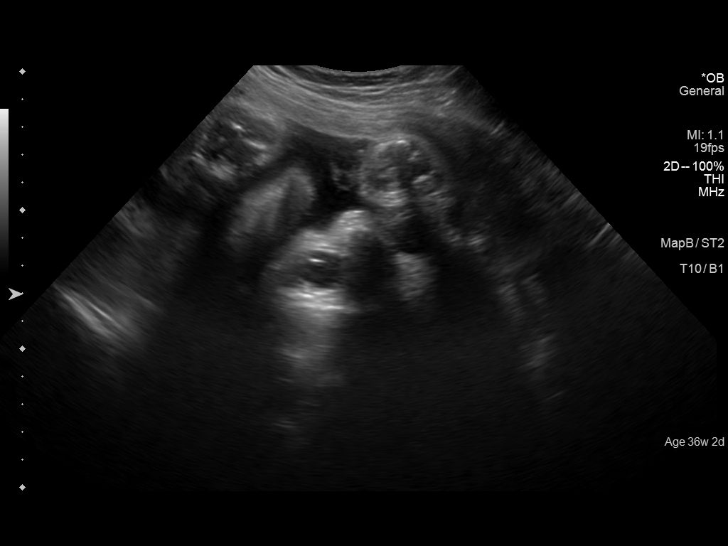

[12 of 28 positions shown; findings below may reference images not displayed]

OBSTETRICS REPORT
                      (Signed Final [DATE] [DATE])

Service(s) Provided

 US OB FOLLOW UP                                       76816.1
Indications

 36 weeks gestation of pregnancy
 No or Little Prenatal Care                            [E3]
 Short interval between pregnancies
Fetal Evaluation

 Num Of Fetuses:    1
 Fetal Heart Rate:  141                          bpm
 Cardiac Activity:  Observed
 Presentation:      Cephalic
 Placenta:          Posterior, above cervical
                    os
 P. Cord            Previously Visualized
 Insertion:

 Amniotic Fluid
 AFI FV:      Subjectively low-normal
 AFI Sum:     10.78   cm       28  %Tile     Larg Pckt:    4.54  cm
 RUQ:   0.85    cm   RLQ:    3.28   cm    LUQ:   2.11    cm   LLQ:    4.54   cm
Biometry

 BPD:     85.5  mm     G. Age:  34w 3d                CI:         79.7   70 - 86
 OFD:    107.3  mm                                    FL/HC:      21.2   20.1 -

 HC:     310.2  mm     G. Age:  34w 4d      < 3  %    HC/AC:      0.96   0.93 -

 AC:       324  mm     G. Age:  36w 2d       62  %    FL/BPD:     76.8   71 - 87
 FL:      65.7  mm     G. Age:  33w 6d        4  %    FL/AC:      20.3   20 - 24
 HUM:     56.4  mm     G. Age:  32w 6d      < 5  %
 CER:     49.5  mm     G. Age:  N/A        > 95  %

 Est. FW:    [E3]  gm    5 lb 13 oz      42  %
Gestational Age

 LMP:           36w 2d        Date:  [DATE]                 EDD:   [DATE]
 U/S Today:     34w 6d                                        EDD:   [DATE]
 Best:          36w 2d     Det. By:  LMP  ([DATE])          EDD:   [DATE]
Anatomy

 Cranium:          Appears normal         Aortic Arch:      Previously seen
 Fetal Cavum:      Appears normal         Ductal Arch:      Previously seen
 Ventricles:       Appears normal         Diaphragm:        Appears normal
 Choroid Plexus:   Appears normal         Stomach:          Appears normal, left
                                                            sided
 Cerebellum:       Appears normal         Abdomen:          Appears normal
 Posterior Fossa:  Previously seen        Abdominal Wall:   Not well visualized
 Nuchal Fold:      Not applicable (>20    Cord Vessels:     Appears normal (3
                   wks GA)                                  vessel cord)
 Face:             Profile prev, Orbits   Kidneys:          Appear normal
                   appear nl.
 Lips:             Previously seen        Bladder:          Appears normal
 Heart:            Appears normal         Spine:            Appears normal
                   (4CH, axis, and
                   situs)
 RVOT:             Appears normal         Lower             Previously seen
                                          Extremities:
 LVOT:             Appears normal         Upper             Previously seen
                                          Extremities:

 Other:  Male gender.  Heels previously seen.  Technically difficult due to
         advanced GA and fetal position.
Cervix Uterus Adnexa

 Cervix:       Not visualized (advanced GA >[E3])
 Uterus:       No abnormality visualized.
 Cul De Sac:   No free fluid seen.
 Left Ovary:    No adnexal mass visualized.
 Right Ovary:   No adnexal mass visualized.

 Adnexa:     No abnormality visualized.
Impression

 Single IUP at 36w 2d
 Normal interval anatomy
 The overall fetal growth is at the 42nd %tile.
 The HC meausre < 3rd %tile, but is well above 2SD below the
 mean.  Normal cranial anatomy is noted.
 The femurs and humerus measure <5th %tile, but appear
 normal in morphology.  Skeletal dysplasia is very unlikely.
 Posterior placenta without previa
 Normal amniotic fluid volume
Recommendations

 Follow-up ultrasounds as clinically indicated.

 questions or concerns.

## 2014-12-14 ENCOUNTER — Encounter: Payer: Self-pay | Admitting: Advanced Practice Midwife

## 2014-12-14 DIAGNOSIS — R87610 Atypical squamous cells of undetermined significance on cytologic smear of cervix (ASC-US): Secondary | ICD-10-CM | POA: Insufficient documentation

## 2014-12-14 DIAGNOSIS — R87810 Cervical high risk human papillomavirus (HPV) DNA test positive: Secondary | ICD-10-CM

## 2014-12-16 ENCOUNTER — Telehealth: Payer: Self-pay | Admitting: *Deleted

## 2014-12-16 NOTE — Telephone Encounter (Addendum)
[name] Loanalled Jadi and gave her results of pap smear and reccomendation to have repeat pap in one year. She reports spotting after her ultrasound 12/12/14 only on that day after ultrasound. States was abd us not vaginal. Instructed her if any further bleeding /spotting to come to [facility] to be examined. Per chart review noted she does not have appt for this week- will ask registar to schedule

## 2014-12-16 NOTE — Telephone Encounter (Signed)
Called [name] [name] back to follow up . She did receive ob appointment for this week. Also verified she is feeling good fetal movement and no vaginal bleeding since last Thursday.

## 2014-12-16 NOTE — Telephone Encounter (Signed)
-----   Message from [place], [place] sent at 12/14/2014  2:41 AM EST ----- ASCUS + HRHPV. Repeat Pap in 12 months.

## 2014-12-19 ENCOUNTER — Ambulatory Visit (INDEPENDENT_AMBULATORY_CARE_PROVIDER_SITE_OTHER): Payer: Medicaid Other | Admitting: Obstetrics & Gynecology

## 2014-12-19 VITALS — BP 113/70 | HR 90 | Temp 97.7°F | Wt 209.8 lb

## 2014-12-19 DIAGNOSIS — O0932 Supervision of pregnancy with insufficient antenatal care, second trimester: Secondary | ICD-10-CM

## 2014-12-19 NOTE — Progress Notes (Signed)
Pain-L side  Pressure-

## 2014-12-19 NOTE — Patient Instructions (Signed)
Group B Streptococcus Infection During Pregnancy Group B streptococcus (GBS) is a type of bacteria often found in healthy women. GBS is not the same as the bacteria that causes strep throat. You may have GBS in your vagina, rectum, or bladder. GBS does not spread through sexual contact, but it can be passed to a baby during childbirth. This can be dangerous for your baby. It is not dangerous to you and usually does not cause any symptoms. Your health care provider may test you for GBS when your pregnancy is between 35 and 37 weeks. GBS is dangerous only during birth, so there is no need to test for it earlier. It is possible to have GBS during pregnancy and never pass it to your baby. If your test results are positive for GBS, your health care provider may recommend giving you antibiotic medicine during delivery to make sure your baby stays healthy. RISK FACTORS You are more likely to pass GBS to your baby if:   Your water breaks (ruptured membrane) or you go into labor before 37 weeks.  Your water breaks 18 hours before you deliver.  You passed GBS during a previous pregnancy.  You have a urinary tract infection caused by GBS any time during pregnancy.  You have a fever during labor. SYMPTOMS Most women who have GBS do not have any symptoms. If you have a urinary tract infection caused by GBS, you might have frequent or painful urination and fever. Babies who get GBS usually show symptoms within 7 days of birth. Symptoms may include:   Breathing problems.  Heart and blood pressure problems.  Digestive and kidney problems. DIAGNOSIS Routine screening for GBS is recommended for all pregnant women. A health care provider takes a sample of the fluid in your vagina and rectum with a swab. It is then sent to a lab to be checked for GBS. A sample of your urine may also be checked for the bacteria.  TREATMENT If you test positive for GBS, you may need treatment with an antibiotic medicine during  labor. As soon as you go into labor, or as soon as your membranes rupture, you will get the antibiotic medicine through an IV access. You will continue to get the medicine until after you give birth. You do not need antibiotic medicine if you are having a cesarean delivery.If your baby shows signs or symptoms of GBS after birth, your baby can also be treated with an antibiotic medicine. HOME CARE INSTRUCTIONS   Take all antibiotic medicine as prescribed by your health care provider. Only take medicine as directed.   Continue with prenatal visits and care.   Keep all follow-up appointments.  SEEK MEDICAL CARE IF:   You have pain when you urinate.   You have to urinate frequently.   You have a fever.  SEEK IMMEDIATE MEDICAL CARE IF:   Your membranes rupture.  You go into labor. Document Released: 01/18/2008 Document Revised: 10/16/2013 Document Reviewed: 08/03/2013 ExitCare Patient Information 2015 [employer]. This information is not intended to replace advice given to you by your health care provider. Make sure you discuss any questions you have with your health care provider.  

## 2014-12-19 NOTE — Progress Notes (Signed)
Not sure about BCM. Explained GBS. NO c/o

## 2014-12-23 ENCOUNTER — Encounter: Payer: Medicaid Other | Admitting: Family Medicine

## 2014-12-24 ENCOUNTER — Encounter: Payer: Self-pay | Admitting: General Practice

## 2014-12-26 ENCOUNTER — Ambulatory Visit (INDEPENDENT_AMBULATORY_CARE_PROVIDER_SITE_OTHER): Payer: Medicaid Other | Admitting: Family

## 2014-12-26 VITALS — BP 120/59 | HR 67 | Temp 98.6°F | Wt 210.5 lb

## 2014-12-26 DIAGNOSIS — O0932 Supervision of pregnancy with insufficient antenatal care, second trimester: Secondary | ICD-10-CM

## 2014-12-26 LAB — POCT URINALYSIS DIP (DEVICE)
BILIRUBIN URINE: NEGATIVE
GLUCOSE, UA: NEGATIVE mg/dL
HGB URINE DIPSTICK: NEGATIVE
KETONES UR: NEGATIVE mg/dL
Nitrite: NEGATIVE
Protein, ur: NEGATIVE mg/dL
Specific Gravity, Urine: 1.015 (ref 1.005–1.030)
Urobilinogen, UA: 0.2 mg/dL (ref 0.0–1.0)
pH: 7 (ref 5.0–8.0)

## 2014-12-26 NOTE — Progress Notes (Signed)
Doing well; no questions or concerns.  Decides to use nexplanon for family planning.

## 2015-01-02 ENCOUNTER — Encounter: Payer: Medicaid Other | Admitting: Obstetrics & Gynecology

## 2015-01-09 ENCOUNTER — Telehealth (HOSPITAL_COMMUNITY): Payer: Self-pay | Admitting: *Deleted

## 2015-01-09 ENCOUNTER — Ambulatory Visit (INDEPENDENT_AMBULATORY_CARE_PROVIDER_SITE_OTHER): Payer: Medicaid Other | Admitting: Family

## 2015-01-09 VITALS — BP 119/61 | HR 71 | Wt 213.2 lb

## 2015-01-09 DIAGNOSIS — O480 Post-term pregnancy: Secondary | ICD-10-CM

## 2015-01-09 NOTE — Telephone Encounter (Signed)
Preadmission screen  

## 2015-01-09 NOTE — Progress Notes (Signed)
Pt doing well.  Denies any questions.  NST-reactive.  Returning on Monday for NST/AFI.  IOL scheduled for 01/14/15.  Urine results not available at discharge.

## 2015-01-09 NOTE — Progress Notes (Signed)
IOL scheduled 01/14/15 at 0630 for postdates

## 2015-01-11 ENCOUNTER — Inpatient Hospital Stay (HOSPITAL_COMMUNITY)
Admission: AD | Admit: 2015-01-11 | Discharge: 2015-01-13 | DRG: 775 | Disposition: A | Discharge status: Home / Self Care | Payer: Medicaid Other | Source: Ambulatory Visit | Attending: Family Medicine | Admitting: Family Medicine

## 2015-01-11 ENCOUNTER — Inpatient Hospital Stay (HOSPITAL_COMMUNITY): Payer: Medicaid Other | Admitting: Anesthesiology

## 2015-01-11 ENCOUNTER — Encounter (HOSPITAL_COMMUNITY): Payer: Self-pay | Admitting: *Deleted

## 2015-01-11 DIAGNOSIS — O99824 Streptococcus B carrier state complicating childbirth: Secondary | ICD-10-CM | POA: Diagnosis present

## 2015-01-11 DIAGNOSIS — Z8249 Family history of ischemic heart disease and other diseases of the circulatory system: Secondary | ICD-10-CM

## 2015-01-11 DIAGNOSIS — Z3A40 40 weeks gestation of pregnancy: Secondary | ICD-10-CM | POA: Diagnosis present

## 2015-01-11 DIAGNOSIS — O9962 Diseases of the digestive system complicating childbirth: Secondary | ICD-10-CM | POA: Diagnosis present

## 2015-01-11 DIAGNOSIS — K219 Gastro-esophageal reflux disease without esophagitis: Secondary | ICD-10-CM | POA: Diagnosis present

## 2015-01-11 DIAGNOSIS — O0933 Supervision of pregnancy with insufficient antenatal care, third trimester: Secondary | ICD-10-CM | POA: Diagnosis not present

## 2015-01-11 DIAGNOSIS — Z833 Family history of diabetes mellitus: Secondary | ICD-10-CM

## 2015-01-11 DIAGNOSIS — O99820 Streptococcus B carrier state complicating pregnancy: Secondary | ICD-10-CM

## 2015-01-11 LAB — TYPE AND SCREEN
ABO/RH(D): A POS
ANTIBODY SCREEN: NEGATIVE

## 2015-01-11 LAB — CBC
HCT: 31.5 % — ABNORMAL LOW (ref 36.0–46.0)
HEMOGLOBIN: 9.6 g/dL — AB (ref 12.0–15.0)
MCH: 23.8 pg — AB (ref 26.0–34.0)
MCHC: 30.5 g/dL (ref 30.0–36.0)
MCV: 78.2 fL (ref 78.0–100.0)
Platelets: 339 10*3/uL (ref 150–400)
RBC: 4.03 MIL/uL (ref 3.87–5.11)
RDW: 16 % — AB (ref 11.5–15.5)
WBC: 12.9 10*3/uL — ABNORMAL HIGH (ref 4.0–10.5)

## 2015-01-11 MED ORDER — OXYCODONE-ACETAMINOPHEN 5-325 MG PO TABS
1.0000 | ORAL_TABLET | Freq: Once | ORAL | Status: AC
  Administered 2015-01-11: 1 via ORAL
  Filled 2015-01-11: qty 1

## 2015-01-11 MED ORDER — IBUPROFEN 600 MG PO TABS
600.0000 mg | ORAL_TABLET | Freq: Four times a day (QID) | ORAL | Status: DC
  Administered 2015-01-11 – 2015-01-13 (×8): 600 mg via ORAL
  Filled 2015-01-11 (×8): qty 1

## 2015-01-11 MED ORDER — OXYTOCIN BOLUS FROM INFUSION
500.0000 mL | INTRAVENOUS | Status: DC
Start: 2015-01-11 — End: 2015-01-11
  Administered 2015-01-11: 500 mL via INTRAVENOUS

## 2015-01-11 MED ORDER — FENTANYL 2.5 MCG/ML BUPIVACAINE 1/10 % EPIDURAL INFUSION (WH - ANES)
INTRAMUSCULAR | Status: AC
  Administered 2015-01-11: 14 mL/h via EPIDURAL
  Filled 2015-01-11: qty 125

## 2015-01-11 MED ORDER — FLEET ENEMA 7-19 GM/118ML RE ENEM: 1.0000 | ENEMA | Freq: Every day | RECTAL | Status: DC | PRN

## 2015-01-11 MED ORDER — LACTATED RINGERS IV SOLN: 500.0000 mL | INTRAVENOUS | Status: DC | PRN

## 2015-01-11 MED ORDER — DEXTROSE 5 % IV SOLN: 5.0000 10*6.[IU] | Freq: Once | INTRAVENOUS | Status: DC

## 2015-01-11 MED ORDER — OXYCODONE-ACETAMINOPHEN 5-325 MG PO TABS: 2.0000 | ORAL_TABLET | ORAL | Status: DC | PRN

## 2015-01-11 MED ORDER — LIDOCAINE HCL (PF) 1 % IJ SOLN
INTRAMUSCULAR | Status: DC | PRN
  Administered 2015-01-11 (×2): 5 mL

## 2015-01-11 MED ORDER — DIPHENHYDRAMINE HCL 50 MG/ML IJ SOLN: 12.5000 mg | INTRAMUSCULAR | Status: DC | PRN

## 2015-01-11 MED ORDER — SENNOSIDES-DOCUSATE SODIUM 8.6-50 MG PO TABS
2.0000 | ORAL_TABLET | ORAL | Status: DC
  Administered 2015-01-11 – 2015-01-13 (×2): 2 via ORAL
  Filled 2015-01-11 (×2): qty 2

## 2015-01-11 MED ORDER — BENZOCAINE-MENTHOL 20-0.5 % EX AERO
1.0000 "application " | INHALATION_SPRAY | CUTANEOUS | Status: DC | PRN
  Administered 2015-01-13: 1 via TOPICAL
  Filled 2015-01-11: qty 56

## 2015-01-11 MED ORDER — ONDANSETRON HCL 4 MG/2ML IJ SOLN
4.0000 mg | Freq: Four times a day (QID) | INTRAMUSCULAR | Status: DC | PRN
  Administered 2015-01-11: 4 mg via INTRAVENOUS
  Filled 2015-01-11: qty 2

## 2015-01-11 MED ORDER — LIDOCAINE HCL (PF) 1 % IJ SOLN
30.0000 mL | INTRAMUSCULAR | Status: DC | PRN
  Filled 2015-01-11: qty 30

## 2015-01-11 MED ORDER — LACTATED RINGERS IV BOLUS (SEPSIS)
1000.0000 mL | Freq: Once | INTRAVENOUS | Status: AC
  Administered 2015-01-11: 1000 mL via INTRAVENOUS

## 2015-01-11 MED ORDER — SODIUM CHLORIDE 0.9 % IJ SOLN: 3.0000 mL | INTRAMUSCULAR | Status: DC | PRN

## 2015-01-11 MED ORDER — SODIUM CHLORIDE 0.9 % IV SOLN
2.0000 g | Freq: Four times a day (QID) | INTRAVENOUS | Status: DC
  Filled 2015-01-11: qty 2000

## 2015-01-11 MED ORDER — BISACODYL 10 MG RE SUPP: 10.0000 mg | Freq: Every day | RECTAL | Status: DC | PRN

## 2015-01-11 MED ORDER — OXYTOCIN 40 UNITS IN LACTATED RINGERS INFUSION - SIMPLE MED: 62.5000 mL/h | INTRAVENOUS | Status: DC | PRN

## 2015-01-11 MED ORDER — SODIUM CHLORIDE 0.9 % IV SOLN
2.0000 g | Freq: Once | INTRAVENOUS | Status: AC
  Administered 2015-01-11: 2 g via INTRAVENOUS
  Filled 2015-01-11: qty 2000

## 2015-01-11 MED ORDER — EPHEDRINE 5 MG/ML INJ
10.0000 mg | INTRAVENOUS | Status: DC | PRN
  Filled 2015-01-11: qty 2

## 2015-01-11 MED ORDER — ACETAMINOPHEN 325 MG PO TABS: 650.0000 mg | ORAL_TABLET | ORAL | Status: DC | PRN

## 2015-01-11 MED ORDER — FENTANYL 2.5 MCG/ML BUPIVACAINE 1/10 % EPIDURAL INFUSION (WH - ANES)
14.0000 mL/h | INTRAMUSCULAR | Status: DC | PRN
  Administered 2015-01-11: 14 mL/h via EPIDURAL

## 2015-01-11 MED ORDER — FENTANYL 2.5 MCG/ML BUPIVACAINE 1/10 % EPIDURAL INFUSION (WH - ANES)
INTRAMUSCULAR | Status: DC | PRN
  Administered 2015-01-11: 14 mL/h via EPIDURAL

## 2015-01-11 MED ORDER — PHENYLEPHRINE 40 MCG/ML (10ML) SYRINGE FOR IV PUSH (FOR BLOOD PRESSURE SUPPORT)
PREFILLED_SYRINGE | INTRAVENOUS | Status: AC
  Filled 2015-01-11: qty 10

## 2015-01-11 MED ORDER — OXYCODONE-ACETAMINOPHEN 5-325 MG PO TABS: 1.0000 | ORAL_TABLET | ORAL | Status: DC | PRN

## 2015-01-11 MED ORDER — LACTATED RINGERS IV SOLN: INTRAVENOUS | Status: DC

## 2015-01-11 MED ORDER — FLEET ENEMA 7-19 GM/118ML RE ENEM: 1.0000 | ENEMA | RECTAL | Status: DC | PRN

## 2015-01-11 MED ORDER — DIBUCAINE 1 % RE OINT: 1.0000 "application " | TOPICAL_OINTMENT | RECTAL | Status: DC | PRN

## 2015-01-11 MED ORDER — LACTATED RINGERS IV SOLN: 500.0000 mL | Freq: Once | INTRAVENOUS | Status: DC

## 2015-01-11 MED ORDER — PENICILLIN G POTASSIUM 5000000 UNITS IJ SOLR: 2.5000 10*6.[IU] | INTRAMUSCULAR | Status: DC

## 2015-01-11 MED ORDER — OXYTOCIN 40 UNITS IN LACTATED RINGERS INFUSION - SIMPLE MED
62.5000 mL/h | INTRAVENOUS | Status: DC
  Filled 2015-01-11: qty 1000

## 2015-01-11 MED ORDER — PRENATAL MULTIVITAMIN CH
1.0000 | ORAL_TABLET | Freq: Every day | ORAL | Status: DC
  Administered 2015-01-12: 1 via ORAL
  Filled 2015-01-11: qty 1

## 2015-01-11 MED ORDER — PHENYLEPHRINE 40 MCG/ML (10ML) SYRINGE FOR IV PUSH (FOR BLOOD PRESSURE SUPPORT)
80.0000 ug | PREFILLED_SYRINGE | INTRAVENOUS | Status: DC | PRN
  Filled 2015-01-11: qty 2

## 2015-01-11 MED ORDER — SIMETHICONE 80 MG PO CHEW: 80.0000 mg | CHEWABLE_TABLET | ORAL | Status: DC | PRN

## 2015-01-11 MED ORDER — SODIUM CHLORIDE 0.9 % IJ SOLN: 3.0000 mL | Freq: Two times a day (BID) | INTRAMUSCULAR | Status: DC

## 2015-01-11 MED ORDER — CITRIC ACID-SODIUM CITRATE 334-500 MG/5ML PO SOLN: 30.0000 mL | ORAL | Status: DC | PRN

## 2015-01-11 MED ORDER — WITCH HAZEL-GLYCERIN EX PADS: 1.0000 "application " | MEDICATED_PAD | CUTANEOUS | Status: DC | PRN

## 2015-01-11 MED ORDER — ZOLPIDEM TARTRATE 5 MG PO TABS: 5.0000 mg | ORAL_TABLET | Freq: Every evening | ORAL | Status: DC | PRN

## 2015-01-11 MED ORDER — OXYCODONE-ACETAMINOPHEN 5-325 MG PO TABS
1.0000 | ORAL_TABLET | ORAL | Status: DC | PRN
  Administered 2015-01-11 – 2015-01-13 (×5): 1 via ORAL
  Filled 2015-01-11 (×5): qty 1

## 2015-01-11 MED ORDER — DIPHENHYDRAMINE HCL 25 MG PO CAPS: 25.0000 mg | ORAL_CAPSULE | Freq: Four times a day (QID) | ORAL | Status: DC | PRN

## 2015-01-11 MED ORDER — ONDANSETRON HCL 4 MG/2ML IJ SOLN: 4.0000 mg | INTRAMUSCULAR | Status: DC | PRN

## 2015-01-11 MED ORDER — LANOLIN HYDROUS EX OINT: TOPICAL_OINTMENT | CUTANEOUS | Status: DC | PRN

## 2015-01-11 MED ORDER — ONDANSETRON HCL 4 MG PO TABS: 4.0000 mg | ORAL_TABLET | ORAL | Status: DC | PRN

## 2015-01-11 MED ORDER — SODIUM CHLORIDE 0.9 % IV SOLN: 250.0000 mL | INTRAVENOUS | Status: DC | PRN

## 2015-01-11 NOTE — H&P (Signed)
Cassandria Drew is a 24 y.o. female presenting for onset of labor.  Maternal Medical History:  Reason for admission: Nausea.     Patient is 24 y.o. [id number] [redacted]w[redacted]d, HRC pt, late to care, learning disability, GBS+ here with complaints of contractions that begin on the night of 01/09/15. She has had no complications with previous pregnancies, no gestational diabetes, and no hypertension.    +FM, denies LOF, VB, vaginal discharge.        [facility]inic [facility] Prenatal Labs  Dating LMP consistent with 27 wk ultrasound Blood type: A+  Genetic Screen Too late Antibody: Neg  Anatomic [place] Nml female at 27 wks. 36 weeks [place] HC<3 and long bones <5, normal morphology. F/U PRN. Rubella: Immune  GTT Early: Third trimester: 90 RPR: NON REAC (04/08 1420)   TDaP vaccine  HBsAg: Neg  Flu vaccine 10/10/14 HIV: NON REACTIVE (03/12 1158)   GBS Pos GBS: Pos  Contraception Nexplanon Pap: ASCUS + HRHPV. Repeat Pap in 12 months  Baby Food Bottle   Circumcision Circ outpatient   Pediatrician [facility]   Support Person Mother Tommy Minichiello        OB History    Gravida Para Term Preterm AB TAB SAB Ectopic Multiple Living   0 0 0 0 0 0 2     Past Medical History  Diagnosis Date  . Acid reflux   . Anxiety     hx of anxiety attack  . Gonorrhea    Past Surgical History  Procedure Laterality Date  . No past surgeries    . Wisdom tooth extraction     Family History: family history includes Asthma in her brother and sister; Diabetes in her maternal grandfather and maternal grandmother; Hypertension in her brother. There is no history of Other. Social History:  reports that she has never smoked. She has never used smokeless tobacco. She reports that she does not drink alcohol or use illicit drugs.   Prenatal Transfer Tool  Maternal Diabetes: No Genetic Screening: Declined Maternal Ultrasounds/Referrals: Normal Fetal Ultrasounds or other Referrals:  None Maternal  Substance Abuse:  No Significant Maternal Medications:  None Significant Maternal Lab Results:  None Other Comments:  None  Review of Systems  Constitutional: Negative for fever and chills.  Eyes: Negative for blurred vision.  Respiratory: Negative for cough and shortness of breath.   Cardiovascular: Negative for leg swelling.  Gastrointestinal: Positive for nausea and vomiting. Negative for abdominal pain and diarrhea.  Genitourinary: Negative for dysuria and hematuria.  Neurological: Negative for headaches.    Dilation: 3 Effacement (%): 80 Station: -1 Exam by:: Quintella Baton RNc Blood pressure 119/81, pulse 76, temperature 98.1 F (36.7 C), resp. rate 20, height  (1.651 m), weight 96.616 kg (213 lb), last menstrual period 04/02/2014, unknown if currently breastfeeding. Exam Physical Exam  Constitutional: She appears well-developed and well-nourished. She appears distressed.  HENT:  Head: Normocephalic and atraumatic.  Eyes: Conjunctivae are normal. Pupils are equal, round, and reactive to light.  Neck: Normal range of motion.  Cardiovascular: Normal rate.   Respiratory: Effort normal. No respiratory distress.  GI: Soft. She exhibits no distension. There is no tenderness.  Musculoskeletal: Normal range of motion. She exhibits no edema.  Neurological: She is alert. Coordination normal.  Skin: Skin is warm and dry. She is not diaphoretic.    Prenatal labs: ABO, Rh: A/POS/-- (12/17 1206) Antibody: NEG (12/17 1206) Rubella: 2.18 (12/17 1206) RPR: NON REAC (12/17 1206)  HBsAg: NEGATIVE (  12/17 1206)  HIV: NONREACTIVE (12/17 1206)  GBS: Positive (02/11 0000)   Assessment/Plan: A: Patient is 24 y.o. G3P2002 109w4d, HRC pt, late to care, learning disability, GBS+ here with complaints of contractions.    P: Admitted for spontaneous labor  # Labor: 1cm on arrival. Currently 3cm / 80% / -1 # Pain:Epidural as required # JYN:[id number]:Category 1: 149 bpm, moderate variability, yes  accelerations, variable decelerations # ID:GBS positive, placed order for Pen G # MOF: Bottle # Circumcision: undecided; discussed options # MOC: Erick AlleyNexaplanon  Zhan, [place] Medical Student 01/11/15, 6:03 AM  Henson,Amber 01/11/2015, 6:03 AM   CNM attestation:  I have seen and examined this patient; I agree with above documentation in the resident's note.   Mikal Planeakia Jagger is a 24 y.o. G3P2002 here for SOL  PE: BP 109/61 mmHg  Pulse 73  Temp(Src) 97.7 F (36.5 C) (Oral)  Resp 20  Ht 5\' 5"  (1.651 m)  Wt 96.616 kg (213 lb)  BMI 35.44 kg/m2  SpO2 100%  LMP 04/02/2014 (Exact Date) Gen: calm comfortable, NAD Resp: normal effort, no distress Abd: gravid  ROS, labs, PMH reviewed  Plan: Expectant management Anticipate SVD Amp rather than PCN due to suspected quick progress after SROM  Addysyn Fern CNM 01/11/2015, 7:54 AM

## 2015-01-11 NOTE — Progress Notes (Signed)
To BS via stretcher to 166

## 2015-01-11 NOTE — Anesthesia Procedure Notes (Signed)
Epidural  Start time: 01/11/2015 6:55 AM End time: 01/11/2015 7:15 AM  Staffing Anesthesiologist: Rosario JacksPOTISEK, Israa Caban Performed by: anesthesiologist   Preanesthetic Checklist Completed: patient identified, pre-op evaluation, timeout performed, IV checked, risks and benefits discussed and monitors and equipment checked  Epidural Patient position: sitting Prep: ChloraPrep Patient monitoring: blood pressure Approach: midline Location: L3-L4 Injection technique: LOR saline  Needle:  Needle type: Tuohy  Needle gauge: 17 G Needle length: 9 cm Needle insertion depth: 6 cm Catheter size: 19 Gauge Catheter at skin depth: 12 cm Test dose: negative  Assessment Events: blood not aspirated, injection not painful, no injection resistance, negative IV test and no paresthesia  Additional Notes Informed consent obtained prior to proceeding including risk of failure, 1% risk of PDPH, risk of minor discomfort and bruising.  Discussed rare but serious complications including epidural abscess, permanent nerve injury, epidural hematoma.  Discussed alternatives to epidural analgesia and patient desires to proceed.  Timeout performed pre-procedure verifying patient name, procedure, and platelet count.  Patient tolerated procedure well.  Catheter secured at 12 at the skin.  SA test negative as confirmed by no motor block of hip abduction at 5 min post injection of 50 mg of lidocaine into the epidural catheter.  Bupivacaine 0.1% with fentanyl 2.135mcg/ml infused post procedure at 2412ml/hr with PCEA of 9ml every 10 mins.

## 2015-01-11 NOTE — Progress Notes (Signed)
Dr Suezanne JacquetHenson notified at [phone] of pt's admission and status. Will watch an hour and reck cervix. Pt may walk if desires

## 2015-01-11 NOTE — MAU Note (Signed)
Contractions since THurs night. Closer and stronger now. Denies LOF or bleeding

## 2015-01-11 NOTE — Progress Notes (Signed)
Dr Suezanne JacquetHenson notified of SROM with light mec fld and pt is in 166

## 2015-01-11 NOTE — Progress Notes (Signed)
Pt moves monitors because says they are too tight. SROM with light green mec. To BS via stretcher

## 2015-01-11 NOTE — Progress Notes (Signed)
Pt difficult to monitor due to maternal movement

## 2015-01-11 NOTE — Progress Notes (Signed)
Dr Suezanne JacquetHenson notified of pt's sve reck and pt requesting pain med. Will come see pt

## 2015-01-11 NOTE — Progress Notes (Signed)
Dr Suezanne JacquetHenson on unit.

## 2015-01-11 NOTE — Progress Notes (Signed)
Pt up to BR

## 2015-01-11 NOTE — Anesthesia Preprocedure Evaluation (Addendum)
Anesthesia Evaluation  Patient identified by MRN, date of birth, ID band Patient awake    Reviewed: Allergy & Precautions, NPO status , Patient's Chart, lab work & pertinent test results  History of Anesthesia Complications Negative for: history of anesthetic complications  Airway Mallampati: III  TM Distance: >3 FB Neck ROM: Full    Dental no notable dental hx.    Pulmonary neg pulmonary ROS,  breath sounds clear to auscultation  Pulmonary exam normal       Cardiovascular negative cardio ROS  Rhythm:Regular Rate:Normal     Neuro/Psych negative neurological ROS  negative psych ROS   GI/Hepatic negative GI ROS, Neg liver ROS, GERD-  ,  Endo/Other  negative endocrine ROS  Renal/GU negative Renal ROS  negative genitourinary   Musculoskeletal negative musculoskeletal ROS (+)   Abdominal Normal abdominal exam  (+)   Peds negative pediatric ROS (+)  Hematology negative hematology ROS (+)   Anesthesia Other Findings   Reproductive/Obstetrics negative OB ROS                             Anesthesia Physical Anesthesia Plan  ASA: III  Anesthesia Plan: Epidural   Post-op Pain Management:    Induction:   Airway Management Planned:   Additional Equipment:   Intra-op Plan:   Post-operative Plan:   Informed Consent: I have reviewed the patients History and Physical, chart, labs and discussed the procedure including the risks, benefits and alternatives for the proposed anesthesia with the patient or authorized representative who has indicated his/her understanding and acceptance.     Plan Discussed with:   Anesthesia Plan Comments: 50(24 yr old G3P2 at 4740 and 4 here in labor.  Plt 339.)      Anesthesia Quick Evaluation

## 2015-01-11 NOTE — Progress Notes (Signed)
Pt vomited and very restless with contractions. Vomited just after taking percocet. Dr Suezanne JacquetHenson aware

## 2015-01-12 LAB — RPR: RPR Ser Ql: NONREACTIVE

## 2015-01-12 NOTE — Progress Notes (Signed)
Clinical Social Work Department PSYCHOSOCIAL ASSESSMENT - MATERNAL/CHILD 01/12/2015  Patient:  [name] [name], [name] [name]  Account Number:  [id number]  [place] Date:  01/11/2015  Ardine Eng Name:   Ollen Barges    Clinical Social Worker:  Cale Decarolis, LCSW   Date/Time:  01/12/2015 10:00 AM  Date Referred:  01/11/2015   Referral source  Central Nursery     Referred reason  Mother has a learning disability   Other referral source:    I:  FAMILY / [place] legal guardian:     Other household support members/support persons Other support:    II  PSYCHOSOCIAL DATA Information Source:    Occupational hygienist Employment:   FOB is employed and mother receives Engineer, structural resources:  [employer] [place]:   Other  [place] / Grade:   Maternity Care Coordinator / Child Services Coordination / Early Interventions:  Cultural issues impacting care:    III  STRENGTHS Strengths  Supportive family/friends  Home prepared for Child (including basic supplies)  Adequate Resources   Strength comment:    IV  RISK FACTORS AND CURRENT PROBLEMS Current Problem:       V  SOCIAL WORK ASSESSMENT Acknowledged order for social work consult.  Mother has hx of learning disability. Met with mother who was pleasant and receptive to social work.   She is a single parent with two other dependents ages 46 and 24 months.  She and her other children reside with maternal grandmother.    FOB is employed and reportedly supportive.  Mother also notes that paternal relatives are also supportive.  Mother states that she was diagnosed with a learning disability.  She denies any hx of mood disorder or substance abuse.   She reports feeling comfortable with caring for her children with the support of her relatives.  Informed that maternal grandmother is not working and of great support to her. Discussed family planning.  Mother states that she has spoken with her  OB about family planning to avoid future unplanned pregnancies.  She was very attentive to newborn during social work visit.      VI SOCIAL WORK PLAN Social Work Plan  No Further Intervention Required / No Barriers to Discharge   Type of pt/family education:   If child protective services report - county:   If child protective services report - date:   Information/referral to community resources comment:   Other social work plan:

## 2015-01-12 NOTE — Anesthesia Postprocedure Evaluation (Signed)
Anesthesia Post Note  Patient: [name] [name]  Procedure(s) Performed: * No procedures listed *  Anesthesia type: Epidural  Patient location: Mother/Baby  Post pain: Pain level controlled  Post assessment: Post-op Vital signs reviewed  Last Vitals:  Filed Vitals:   01/12/15 0614  BP: 125/78  Pulse: 63  Temp: 36.7 C  Resp: 17    Post vital signs: Reviewed  Level of consciousness:alert  Complications: No apparent anesthesia complications

## 2015-01-13 ENCOUNTER — Other Ambulatory Visit: Payer: Medicaid Other

## 2015-01-13 MED ORDER — IBUPROFEN 600 MG PO TABS: 600.0000 mg | ORAL_TABLET | Freq: Four times a day (QID) | ORAL | Status: DC

## 2015-01-13 MED ORDER — OXYCODONE-ACETAMINOPHEN 5-325 MG PO TABS: 1.0000 | ORAL_TABLET | ORAL | Status: DC | PRN

## 2015-01-13 NOTE — Progress Notes (Signed)
UR chart review completed.  

## 2015-01-13 NOTE — Discharge Summary (Signed)
Obstetric Discharge Summary Reason for Admission: onset of labor Prenatal Procedures: ultrasound Intrapartum Procedures: spontaneous vaginal delivery Postpartum Procedures: none Complications-Operative and Postpartum: none HEMOGLOBIN  Date Value Ref Range Status  01/11/2015 9.6* 12.0 - 15.0 g/dL Final   HCT  Date Value Ref Range Status  01/11/2015 31.5* 36.0 - 46.0 % Final    Physical Exam:  General: alert, cooperative, appears stated age and no distress Lochia: appropriate Uterine Fundus: firm Incision: n/a DVT Evaluation: No evidence of DVT seen on physical exam. Negative Homan's sign. No cords or calf tenderness. No significant calf/ankle edema.  Discharge Diagnoses: Term Pregnancy-delivered  Discharge Information: Date: 01/13/2015 Activity: pelvic rest Diet: routine Medications: PNV, Ibuprofen and Percocet Condition: stable and improved Instructions: refer to practice specific booklet Discharge to: home   Newborn Data: Live born female  Birth Weight: 6 lb 11.6 oz (3050 g) APGAR: 8, 9  Home with mother.  Aundre [name] [name] 01/13/2015, 7:25 AM

## 2015-01-14 ENCOUNTER — Inpatient Hospital Stay (HOSPITAL_COMMUNITY): Admission: RE | Admit: 2015-01-14 | Payer: Medicaid Other | Source: Ambulatory Visit

## 2015-01-15 ENCOUNTER — Ambulatory Visit: Payer: Medicaid Other | Admitting: Obstetrics & Gynecology

## 2015-02-17 ENCOUNTER — Ambulatory Visit: Payer: Medicaid Other | Admitting: Family Medicine

## 2015-03-07 ENCOUNTER — Ambulatory Visit: Payer: Medicaid Other | Admitting: Family Medicine

## 2015-05-23 ENCOUNTER — Ambulatory Visit (INDEPENDENT_AMBULATORY_CARE_PROVIDER_SITE_OTHER): Payer: Medicaid Other | Admitting: Obstetrics & Gynecology

## 2015-05-23 ENCOUNTER — Encounter: Payer: Self-pay | Admitting: Obstetrics & Gynecology

## 2015-05-23 VITALS — BP 113/57 | HR 67 | Temp 98.5°F | Ht 65.0 in | Wt 195.1 lb

## 2015-05-23 DIAGNOSIS — Z113 Encounter for screening for infections with a predominantly sexual mode of transmission: Secondary | ICD-10-CM

## 2015-05-23 DIAGNOSIS — Z975 Presence of (intrauterine) contraceptive device: Secondary | ICD-10-CM

## 2015-05-23 LAB — POCT PREGNANCY, URINE: PREG TEST UR: NEGATIVE

## 2015-05-23 MED ORDER — LEVONORGESTREL 18.6 MCG/DAY IU IUD
INTRAUTERINE_SYSTEM | Freq: Once | INTRAUTERINE | Status: AC
  Administered 2015-05-23: 1 via INTRAUTERINE

## 2015-05-23 NOTE — Patient Instructions (Signed)
Levonorgestrel intrauterine device (IUD) What is this medicine? LEVONORGESTREL IUD (LEE voe nor jes trel) is a contraceptive (birth control) device. The device is placed inside the uterus by a healthcare professional. It is used to prevent pregnancy and can also be used to treat heavy bleeding that occurs during your period. Depending on the device, it can be used for 3 to 5 years. This medicine may be used for other purposes; ask your health care provider or pharmacist if you have questions. COMMON BRAND NAME(S): LILETTA, Mirena, Skyla What should I tell my health care provider before I take this medicine? They need to know if you have any of these conditions: -abnormal Pap smear -cancer of the breast, uterus, or cervix -diabetes -endometritis -genital or pelvic infection now or in the past -have more than one sexual partner or your partner has more than one partner -heart disease -history of an ectopic or tubal pregnancy -immune system problems -IUD in place -liver disease or tumor -problems with blood clots or take blood-thinners -use intravenous drugs -uterus of unusual shape -vaginal bleeding that has not been explained -an unusual or allergic reaction to levonorgestrel, other hormones, silicone, or polyethylene, medicines, foods, dyes, or preservatives -pregnant or trying to get pregnant -breast-feeding How should I use this medicine? This device is placed inside the uterus by a health care professional. Talk to your pediatrician regarding the use of this medicine in children. Special care may be needed. Overdosage: If you think you have taken too much of this medicine contact a poison control center or emergency room at once. NOTE: This medicine is only for you. Do not share this medicine with others. What if I miss a dose? This does not apply. What may interact with this medicine? Do not take this medicine with any of the following  medications: -amprenavir -bosentan -fosamprenavir This medicine may also interact with the following medications: -aprepitant -barbiturate medicines for inducing sleep or treating seizures -bexarotene -griseofulvin -medicines to treat seizures like carbamazepine, ethotoin, felbamate, oxcarbazepine, phenytoin, topiramate -modafinil -pioglitazone -rifabutin -rifampin -rifapentine -some medicines to treat HIV infection like atazanavir, indinavir, lopinavir, nelfinavir, tipranavir, ritonavir -St. John's wort -warfarin This list may not describe all possible interactions. Give your health care provider a list of all the medicines, herbs, non-prescription drugs, or dietary supplements you use. Also tell them if you smoke, drink alcohol, or use illegal drugs. Some items may interact with your medicine. What should I watch for while using this medicine? Visit your doctor or health care professional for regular check ups. See your doctor if you or your partner has sexual contact with others, becomes HIV positive, or gets a sexual transmitted disease. This product does not protect you against HIV infection (AIDS) or other sexually transmitted diseases. You can check the placement of the IUD yourself by reaching up to the top of your vagina with clean fingers to feel the threads. Do not pull on the threads. It is a good habit to check placement after each menstrual period. Call your doctor right away if you feel more of the IUD than just the threads or if you cannot feel the threads at all. The IUD may come out by itself. You may become pregnant if the device comes out. If you notice that the IUD has come out use a backup birth control method like condoms and call your health care provider. Using tampons will not change the position of the IUD and are okay to use during your period. What side effects may   I notice from receiving this medicine? Side effects that you should report to your doctor or  health care professional as soon as possible: -allergic reactions like skin rash, itching or hives, swelling of the face, lips, or tongue -fever, flu-like symptoms -genital sores -high blood pressure -no menstrual period for 6 weeks during use -pain, swelling, warmth in the leg -pelvic pain or tenderness -severe or sudden headache -signs of pregnancy -stomach cramping -sudden shortness of breath -trouble with balance, talking, or walking -unusual vaginal bleeding, discharge -yellowing of the eyes or skin Side effects that usually do not require medical attention (report to your doctor or health care professional if they continue or are bothersome): -acne -breast pain -change in sex drive or performance -changes in weight -cramping, dizziness, or faintness while the device is being inserted -headache -irregular menstrual bleeding within first 3 to 6 months of use -nausea This list may not describe all possible side effects. Call your doctor for medical advice about side effects. You may report side effects to FDA at 1-800-FDA-1088. Where should I keep my medicine? This does not apply. NOTE: This sheet is a summary. It may not cover all possible information. If you have questions about this medicine, talk to your doctor, pharmacist, or health care provider.  2015, Elsevier/Gold Standard. (2011-11-11 13:54:04)  

## 2015-05-23 NOTE — Addendum Note (Signed)
Addended by: Kathee Delton on: 05/23/2015 10:45 AM   Modules accepted: Orders

## 2015-05-23 NOTE — Progress Notes (Signed)
Patient ID: [name] [name], female   DOB: 1991-09-30, 24 y.o.   MRN: [id number] GYNECOLOGY CLINIC PROCEDURE NOTE  [name] [name] is a 24 y.o. G3P3003 here for Mirena IUD insertion. No GYN concerns.  Last pap smear was on 08/20/2013 and was normal.  IUD Insertion Procedure Note Patient identified, informed consent performed.  Discussed risks of irregular bleeding, cramping, infection, malpositioning or misplacement of the IUD outside the uterus which may require further procedures. Time out was performed.  Urine pregnancy test negative.  Speculum placed in the vagina.  Cervix visualized.  Cleaned with Betadine x 2.  Grasped anteriorly with a single tooth tenaculum.  Uterus sounded to 8 cm.  Mirena IUD placed per manufacturer's recommendations.  Strings trimmed to 3 cm. Tenaculum was removed, good hemostasis noted.  Patient tolerated procedure well.   Patient was given post-procedure instructions.  Cx obtained.  F/u cx. Patient was also asked to check IUD strings periodically and follow up in 4 weeks for IUD check.  Mikhael Hendriks L. Harraway-Smith, M.D., [facility]

## 2015-05-24 LAB — GC/CHLAMYDIA PROBE AMP
CT Probe RNA: NEGATIVE
GC Probe RNA: NEGATIVE

## 2015-06-20 ENCOUNTER — Ambulatory Visit: Payer: Medicaid Other | Admitting: Obstetrics & Gynecology

## 2015-07-10 ENCOUNTER — Ambulatory Visit: Payer: Medicaid Other | Admitting: Medical

## 2015-08-01 ENCOUNTER — Ambulatory Visit: Payer: Medicaid Other | Admitting: Obstetrics & Gynecology

## 2015-08-28 ENCOUNTER — Ambulatory Visit: Payer: Medicaid Other | Admitting: Obstetrics & Gynecology

## 2015-10-06 ENCOUNTER — Telehealth: Payer: Self-pay | Admitting: *Deleted

## 2015-10-06 NOTE — Telephone Encounter (Signed)
Pt left message stating that she has questions about the iud. Would like a call back to discuss.

## 2015-10-06 NOTE — Telephone Encounter (Signed)
Called patient back she states that she is experiencing headaches. I advised that she see her pcp for evaluation of the headaches. Patient denies visual disturbances. Bleeding is well controlled on the liletta. Pt to call back if her pcp feels that headache is related to iud.

## 2015-11-15 ENCOUNTER — Emergency Department (HOSPITAL_COMMUNITY)
Admission: EM | Admit: 2015-11-15 | Discharge: 2015-11-15 | Disposition: A | Discharge status: Home / Self Care | Payer: Medicaid Other | Attending: Emergency Medicine | Admitting: Emergency Medicine

## 2015-11-15 ENCOUNTER — Emergency Department (HOSPITAL_COMMUNITY): Payer: Medicaid Other

## 2015-11-15 ENCOUNTER — Encounter (HOSPITAL_COMMUNITY): Payer: Self-pay | Admitting: Emergency Medicine

## 2015-11-15 DIAGNOSIS — Z8659 Personal history of other mental and behavioral disorders: Secondary | ICD-10-CM | POA: Diagnosis not present

## 2015-11-15 DIAGNOSIS — R079 Chest pain, unspecified: Secondary | ICD-10-CM

## 2015-11-15 DIAGNOSIS — Z8719 Personal history of other diseases of the digestive system: Secondary | ICD-10-CM | POA: Insufficient documentation

## 2015-11-15 DIAGNOSIS — Z3202 Encounter for pregnancy test, result negative: Secondary | ICD-10-CM | POA: Insufficient documentation

## 2015-11-15 DIAGNOSIS — R51 Headache: Secondary | ICD-10-CM | POA: Insufficient documentation

## 2015-11-15 DIAGNOSIS — Z8619 Personal history of other infectious and parasitic diseases: Secondary | ICD-10-CM | POA: Insufficient documentation

## 2015-11-15 DIAGNOSIS — R519 Headache, unspecified: Secondary | ICD-10-CM

## 2015-11-15 DIAGNOSIS — Z79899 Other long term (current) drug therapy: Secondary | ICD-10-CM | POA: Insufficient documentation

## 2015-11-15 DIAGNOSIS — G8929 Other chronic pain: Secondary | ICD-10-CM | POA: Insufficient documentation

## 2015-11-15 LAB — BASIC METABOLIC PANEL
Anion gap: 10 (ref 5–15)
BUN: 8 mg/dL (ref 6–20)
CO2: 28 mmol/L (ref 22–32)
Calcium: 9.4 mg/dL (ref 8.9–10.3)
Chloride: 101 mmol/L (ref 101–111)
Creatinine, Ser: 0.73 mg/dL (ref 0.44–1.00)
GFR calc Af Amer: >60 mL/min (ref >60)
GFR calc non Af Amer: >60 mL/min (ref >60)
Glucose, Bld: 117 mg/dL — ABNORMAL HIGH (ref 65–99)
POTASSIUM: 4.3 mmol/L (ref 3.5–5.1)
Sodium: 139 mmol/L (ref 135–145)

## 2015-11-15 LAB — I-STAT TROPONIN, ED: Troponin i, poc: 0 ng/mL (ref 0.00–0.08)

## 2015-11-15 LAB — CBC
HCT: 36 % (ref 36.0–46.0)
Hemoglobin: 11.2 g/dL — ABNORMAL LOW (ref 12.0–15.0)
MCH: 25.7 pg — AB (ref 26.0–34.0)
MCHC: 31.1 g/dL (ref 30.0–36.0)
MCV: 82.8 fL (ref 78.0–100.0)
PLATELETS: 488 10*3/uL — AB (ref 150–400)
RBC: 4.35 MIL/uL (ref 3.87–5.11)
RDW: 14.4 % (ref 11.5–15.5)
WBC: 8.1 10*3/uL (ref 4.0–10.5)

## 2015-11-15 LAB — I-STAT BETA HCG BLOOD, ED (MC, WL, AP ONLY): I-stat hCG, quantitative: <5 m[IU]/mL (ref <5)

## 2015-11-15 LAB — D-DIMER, QUANTITATIVE (NOT AT ARMC): D DIMER QUANT: 0.54 ug{FEU}/mL — AB (ref 0.00–0.50)

## 2015-11-15 MED ORDER — IOHEXOL 350 MG/ML SOLN
80.0000 mL | Freq: Once | INTRAVENOUS | Status: AC | PRN
Start: 2015-11-15 — End: 2015-11-15
  Administered 2015-11-15: 80 mL via INTRAVENOUS

## 2015-11-15 NOTE — ED Notes (Signed)
Pt sts CP worse with inspiration and HA x 3 days

## 2015-11-15 NOTE — Discharge Instructions (Signed)
General Headache Without Cause °A headache is pain or discomfort felt around the head or neck area. The specific cause of a headache may not be found. There are many causes and types of headaches. A few common ones are: °· Tension headaches. °· Migraine headaches. °· Cluster headaches. °· Chronic daily headaches. °HOME CARE INSTRUCTIONS  °Watch your condition for any changes. Take these steps to help with your condition: °Managing Pain °· Take over-the-counter and prescription medicines only as told by your health care provider. °· Lie down in a dark, quiet room when you have a headache. °· If directed, apply ice to the head and neck area: °· Put ice in a plastic bag. °· Place a towel between your skin and the bag. °· Leave the ice on for 20 minutes, 2-3 times per day. °· Use a heating pad or hot shower to apply heat to the head and neck area as told by your health care provider. °· Keep lights dim if bright lights bother you or make your headaches worse. °Eating and Drinking °· Eat meals on a regular schedule. °· Limit alcohol use. °· Decrease the amount of caffeine you drink, or stop drinking caffeine. °General Instructions °· Keep all follow-up visits as told by your health care provider. This is important. °· Keep a headache journal to help find out what may trigger your headaches. For example, write down: °· What you eat and drink. °· How much sleep you get. °· Any change to your diet or medicines. °· Try massage or other relaxation techniques. °· Limit stress. °· Sit up straight, and do not tense your muscles. °· Do not use tobacco products, including cigarettes, chewing tobacco, or e-cigarettes. If you need help quitting, ask your health care provider. °· Exercise regularly as told by your health care provider. °· Sleep on a regular schedule. Get 7-9 hours of sleep, or the amount recommended by your health care provider. °SEEK MEDICAL CARE IF:  °· Your symptoms are not helped by medicine. °· You have a  headache that is different from the usual headache. °· You have nausea or you vomit. °· You have a fever. °SEEK IMMEDIATE MEDICAL CARE IF:  °· Your headache becomes severe. °· You have repeated vomiting. °· You have a stiff neck. °· You have a loss of vision. °· You have problems with speech. °· You have pain in the eye or ear. °· You have muscular weakness or loss of muscle control. °· You lose your balance or have trouble walking. °· You feel faint or pass out. °· You have confusion. °  °This information is not intended to replace advice given to you by your health care provider. Make sure you discuss any questions you have with your health care provider. °  °Document Released: 10/11/2005 Document Revised: 07/02/2015 Document Reviewed: 02/03/2015 °Elsevier Interactive Patient Education ©2016 Elsevier Inc. ° °Nonspecific Chest Pain  °Chest pain can be caused by many different conditions. There is always a chance that your pain could be related to something serious, such as a heart attack or a blood clot in your lungs. Chest pain can also be caused by conditions that are not life-threatening. If you have chest pain, it is very important to follow up with your health care provider. °CAUSES  °Chest pain can be caused by: °· Heartburn. °· Pneumonia or bronchitis. °· Anxiety or stress. °· Inflammation around your heart (pericarditis) or lung (pleuritis or pleurisy). °· A blood clot in your lung. °· A collapsed   lung (pneumothorax). It can develop suddenly on its own (spontaneous pneumothorax) or from trauma to the chest. °· Shingles infection (varicella-zoster virus). °· Heart attack. °· Damage to the bones, muscles, and cartilage that make up your chest wall. This can include: °¨ Bruised bones due to injury. °¨ Strained muscles or cartilage due to frequent or repeated coughing or overwork. °¨ Fracture to one or more ribs. °¨ Sore cartilage due to inflammation (costochondritis). °RISK FACTORS  °Risk factors for chest  pain may include: °· Activities that increase your risk for trauma or injury to your chest. °· Respiratory infections or conditions that cause frequent coughing. °· Medical conditions or overeating that can cause heartburn. °· Heart disease or family history of heart disease. °· Conditions or health behaviors that increase your risk of developing a blood clot. °· Having had chicken pox (varicella zoster). °SIGNS AND SYMPTOMS °Chest pain can feel like: °· Burning or tingling on the surface of your chest or deep in your chest. °· Crushing, pressure, aching, or squeezing pain. °· Dull or sharp pain that is worse when you move, cough, or take a deep breath. °· Pain that is also felt in your back, neck, shoulder, or arm, or pain that spreads to any of these areas. °Your chest pain may come and go, or it may stay constant. °DIAGNOSIS °Lab tests or other studies may be needed to find the cause of your pain. Your health care provider may have you take a test called an ambulatory ECG (electrocardiogram). An ECG records your heartbeat patterns at the time the test is performed. You may also have other tests, such as: °· Transthoracic echocardiogram (TTE). During echocardiography, sound waves are used to create a picture of all of the heart structures and to look at how blood flows through your heart. °· Transesophageal echocardiogram (TEE). This is a more advanced imaging test that obtains images from inside your body. It allows your health care provider to see your heart in finer detail. °· Cardiac monitoring. This allows your health care provider to monitor your heart rate and rhythm in real time. °· Holter monitor. This is a portable device that records your heartbeat and can help to diagnose abnormal heartbeats. It allows your health care provider to track your heart activity for several days, if needed. °· Stress tests. These can be done through exercise or by taking medicine that makes your heart beat more  quickly. °· Blood tests. °· Imaging tests. °TREATMENT  °Your treatment depends on what is causing your chest pain. Treatment may include: °· Medicines. These may include: °¨ Acid blockers for heartburn. °¨ Anti-inflammatory medicine. °¨ Pain medicine for inflammatory conditions. °¨ Antibiotic medicine, if an infection is present. °¨ Medicines to dissolve blood clots. °¨ Medicines to treat coronary artery disease. °· Supportive care for conditions that do not require medicines. This may include: °¨ Resting. °¨ Applying heat or cold packs to injured areas. °¨ Limiting activities until pain decreases. °HOME CARE INSTRUCTIONS °· If you were prescribed an antibiotic medicine, finish it all even if you start to feel better. °· Avoid any activities that bring on chest pain. °· Do not use any tobacco products, including cigarettes, chewing tobacco, or electronic cigarettes. If you need help quitting, ask your health care provider. °· Do not drink alcohol. °· Take medicines only as directed by your health care provider. °· Keep all follow-up visits as directed by your health care provider. This is important. This includes any further testing if your chest pain   does not go away. °· If heartburn is the cause for your chest pain, you may be told to keep your head raised (elevated) while sleeping. This reduces the chance that acid will go from your stomach into your esophagus. °· Make lifestyle changes as directed by your health care provider. These may include: °¨ Getting regular exercise. Ask your health care provider to suggest some activities that are safe for you. °¨ Eating a heart-healthy diet. A registered dietitian can help you to learn healthy eating options. °¨ Maintaining a healthy weight. °¨ Managing diabetes, if necessary. °¨ Reducing stress. °SEEK MEDICAL CARE IF: °· Your chest pain does not go away after treatment. °· You have a rash with blisters on your chest. °· You have a fever. °SEEK IMMEDIATE MEDICAL CARE  IF:  °· Your chest pain is worse. °· You have an increasing cough, or you cough up blood. °· You have severe abdominal pain. °· You have severe weakness. °· You faint. °· You have chills. °· You have sudden, unexplained chest discomfort. °· You have sudden, unexplained discomfort in your arms, back, neck, or jaw. °· You have shortness of breath at any time. °· You suddenly start to sweat, or your skin gets clammy. °· You feel nauseous or you vomit. °· You suddenly feel light-headed or dizzy. °· Your heart begins to beat quickly, or it feels like it is skipping beats. °These symptoms may represent a serious problem that is an emergency. Do not wait to see if the symptoms will go away. Get medical help right away. Call your local emergency services (911 in the [place] Do not drive yourself to the hospital. °  °This information is not intended to replace advice given to you by your health care provider. Make sure you discuss any questions you have with your health care provider. °  °Document Released: 07/21/2005 Document Revised: 11/01/2014 Document Reviewed: 05/17/2014 °Elsevier Interactive Patient Education ©2016 Elsevier Inc. ° °

## 2015-11-15 NOTE — ED Provider Notes (Signed)
CSN: [id number]     Arrival date & time 11/15/15  1725 History   First MD Initiated Contact with Patient 11/15/15 1911     Chief Complaint  Patient presents with  . Chest Pain  . Headache     (Consider location/radiation/quality/duration/timing/severity/associated sxs/prior Treatment) HPI Comments: Patient presents to the emergency department with chief complaint of chest pain. She states that the chest pain started on Thursday of this week. She denies any fevers, chills, cough, or shortness of breath. She reports that the pain is worsened with inspiration. She has not taken anything for her symptoms. She states that she is concerned about blood clot because she takes birth control. Additionally, she complains of daily headaches times several months. She states that she has seen by her primary care doctor for this, who her that she has migraines. She denies numbness, weakness, or tingling.  The history is provided by the patient. No language interpreter was used.    Past Medical History  Diagnosis Date  . Acid reflux   . Anxiety     hx of anxiety attack  . Gonorrhea    Past Surgical History  Procedure Laterality Date  . No past surgeries    . Wisdom tooth extraction     Family History  Problem Relation Age of Onset  . Other Neg Hx   . Asthma Sister   . Hypertension Brother   . Asthma Brother   . Diabetes Maternal Grandmother   . Diabetes Maternal Grandfather    Social History  Substance Use Topics  . Smoking status: Never Smoker   . Smokeless tobacco: Never Used  . Alcohol Use: No   OB History    Gravida Para Term Preterm AB TAB SAB Ectopic Multiple Living   0 0 0 0 0 0 3     Review of Systems  Constitutional: Negative for fever and chills.  Respiratory: Negative for shortness of breath.   Cardiovascular: Positive for chest pain.  Gastrointestinal: Negative for nausea, vomiting, diarrhea and constipation.  Genitourinary: Negative for dysuria.  Neurological:  Positive for headaches.  All other systems reviewed and are negative.     Allergies  Review of patient's allergies indicates no known allergies.  Home Medications   Prior to Admission medications   Medication Sig Start Date End Date Taking? Authorizing Provider  Prenatal Vit-Fe Fumarate-FA (PRENATAL COMPLETE) 14-0.4 MG TABS Take 1 tablet by mouth daily. 06/05/14   Kaitlyn Szekalski, PA-C   BP 128/82 mmHg  Pulse 105  Temp(Src) 98.5 F (36.9 C) (Oral)  Resp 18  SpO2 100%  LMP 11/10/2015 Physical Exam  Constitutional: She is oriented to person, place, and time. She appears well-developed and well-nourished.  HENT:  Head: Normocephalic and atraumatic.  Eyes: Conjunctivae and EOM are normal. Pupils are equal, round, and reactive to light.  Neck: Normal range of motion. Neck supple.  Cardiovascular: Normal rate and regular rhythm.  Exam reveals no gallop and no friction rub.   No murmur heard. Normal rate on my exam  Pulmonary/Chest: Effort normal and breath sounds normal. No respiratory distress. She has no wheezes. She has no rales. She exhibits no tenderness.  Abdominal: Soft. Bowel sounds are normal. She exhibits no distension and no mass. There is no tenderness. There is no rebound and no guarding.  Musculoskeletal: Normal range of motion. She exhibits no edema or tenderness.  Moves all extremities  Neurological: She is alert and oriented to person, place, and time.  CN III-12  intact, speech is clear, movements are goal oriented  Skin: Skin is warm and dry.  Psychiatric: She has a normal mood and affect. Her behavior is normal. Judgment and thought content normal.  Nursing note and vitals reviewed.   ED Course  Procedures (including critical care time)  Results for orders placed or performed during the hospital encounter of 11/15/15  Basic metabolic panel  Result Value Ref Range   Sodium 139 135 - 145 mmol/L   Potassium 4.3 3.5 - 5.1 mmol/L   Chloride 101 101 - 111  mmol/L   CO2 28 22 - 32 mmol/L   Glucose, Bld 117 (H) 65 - 99 mg/dL   BUN 8 6 - 20 mg/dL   Creatinine, Ser 1.61 0.44 - 1.00 mg/dL   Calcium 9.4 8.9 - 09.6 mg/dL   GFR calc non Af Amer >60 >60 mL/min   GFR calc Af Amer >60 >60 mL/min   Anion gap 10 5 - 15  CBC  Result Value Ref Range   WBC 8.1 4.0 - 10.5 K/uL   RBC 4.35 3.87 - 5.11 MIL/uL   Hemoglobin 11.2 (L) 12.0 - 15.0 g/dL   HCT 04.5 40.9 - 81.1 %   MCV 82.8 78.0 - 100.0 fL   MCH 25.7 (L) 26.0 - 34.0 pg   MCHC 31.1 30.0 - 36.0 g/dL   RDW 91.4 78.2 - 95.6 %   Platelets 488 (H) 150 - 400 K/uL  D-dimer, quantitative (not at [facility])  Result Value Ref Range   D-Dimer, Quant 0.54 (H) 0.00 - 0.50 ug/mL-FEU  I-stat troponin, ED (not at [facility], [facility])  Result Value Ref Range   Troponin i, poc 0.00 0.00 - 0.08 ng/mL   Comment 3          I-Stat Beta hCG blood, ED ([facility], [facility], [facility] only)  Result Value Ref Range   I-stat hCG, quantitative <5.0 <5 mIU/mL   Comment 3           Dg Chest 2 View  11/15/2015  CLINICAL DATA:  Chest pain today, headaches for several months nonsmoker EXAM: CHEST  2 VIEW COMPARISON:  None. FINDINGS: The heart size and mediastinal contours are within normal limits. Both lungs are clear. The visualized skeletal structures are unremarkable. IMPRESSION: No active cardiopulmonary disease. Electronically Signed   By: Esperanza Heir M.D.   On: 11/15/2015 17:53   Ct Angio Chest Pe W/cm &/or Wo Cm  11/15/2015  CLINICAL DATA:  25 year old female with chest pain and elevated D-dimer. EXAM: CT ANGIOGRAPHY CHEST WITH CONTRAST TECHNIQUE: Multidetector CT imaging of the chest was performed using the standard protocol during bolus administration of intravenous contrast. Multiplanar CT image reconstructions and MIPs were obtained to evaluate the vascular anatomy. CONTRAST:  80mL OMNIPAQUE IOHEXOL 350 MG/ML SOLN COMPARISON:  Radiograph dated 11/15/2015 FINDINGS: The lungs are clear. No pleural effusion or pneumothorax. The central airways are  patent. The thoracic aorta appears unremarkable. No CT evidence of pulmonary embolism. Top-normal cardiac size. No pericardial effusion. There is no hilar or mediastinal adenopathy. The esophagus is grossly unremarkable. Residual thymic tissue noted in the anterior mediastinum. No thyroid nodules identified. There is no axillary adenopathy. The chest wall soft tissues appear unremarkable. The osseous structures are intact. The visualized upper abdomen appear unremarkable. Review of the MIP images confirms the above findings. IMPRESSION: No CT evidence of pulmonary embolism. Electronically Signed   By: Elgie Collard M.D.   On: 11/15/2015 21:40     I have personally reviewed  and evaluated these images and lab results as part of my medical decision-making.   EKG Interpretation   Date/Time:  Saturday November 15 2015 17:34:22 EST Ventricular Rate:  101 PR Interval:  154 QRS Duration: 76 QT Interval:  336 QTC Calculation: 435 R Axis:   67 Text Interpretation:  Sinus tachycardia Non-specific ST-t changes No  previous tracing Confirmed by Denton Lank  MD, Caryn Bee ([id number]) on 11/15/2015  7:17:03 PM      MDM   Final diagnoses:  Chest pain, unspecified chest pain type  Chronic nonintractable headache, unspecified headache type   Patient with chest pain. Symptoms started 3 days ago. Troponin ordered in triage is negative, EKG remarkable for sinus tachycardia. CBC and BMP are largely unremarkable. As the patient was tachycardic on arrival, cannot use PERC criteria, patient will require d-dimer.  Additionally, patient has had headaches for which she is followed by her PCP for this, states that she has migraines. She is neurovascularly intact. Doubt SAH.  D-dimer is elevated at 0.54, will proceed with CT angiogram of chest.  9:48 PM CT is negative. Will discharge patient home with primary care follow-up. Will also recommend neurology follow-up for persistent headaches. She is neurovascularly intact.  She is stable and ready for discharge.      Roxy Horseman, PA-C 11/15/15 2210  Cathren Laine, MD 11/16/15 [phone]

## 2015-11-15 NOTE — ED Notes (Signed)
Pt departed under her own power and in NAD.  

## 2015-11-15 NOTE — ED Notes (Signed)
Pt at CT

## 2016-02-26 ENCOUNTER — Emergency Department (HOSPITAL_COMMUNITY): Payer: Medicaid Other

## 2016-02-26 ENCOUNTER — Emergency Department (HOSPITAL_COMMUNITY)
Admission: EM | Admit: 2016-02-26 | Discharge: 2016-02-26 | Disposition: A | Discharge status: Home / Self Care | Payer: Medicaid Other | Attending: Emergency Medicine | Admitting: Emergency Medicine

## 2016-02-26 ENCOUNTER — Encounter (HOSPITAL_COMMUNITY): Payer: Self-pay | Admitting: Emergency Medicine

## 2016-02-26 DIAGNOSIS — Z8659 Personal history of other mental and behavioral disorders: Secondary | ICD-10-CM | POA: Insufficient documentation

## 2016-02-26 DIAGNOSIS — Z79899 Other long term (current) drug therapy: Secondary | ICD-10-CM | POA: Diagnosis not present

## 2016-02-26 DIAGNOSIS — M79674 Pain in right toe(s): Secondary | ICD-10-CM | POA: Diagnosis not present

## 2016-02-26 DIAGNOSIS — Z8619 Personal history of other infectious and parasitic diseases: Secondary | ICD-10-CM | POA: Insufficient documentation

## 2016-02-26 DIAGNOSIS — Z8719 Personal history of other diseases of the digestive system: Secondary | ICD-10-CM | POA: Insufficient documentation

## 2016-02-26 MED ORDER — NAPROXEN 500 MG PO TABS: 500.0000 mg | ORAL_TABLET | Freq: Two times a day (BID) | ORAL | Status: DC

## 2016-02-26 NOTE — ED Notes (Signed)
Pt states for the last three days her toes on the right side of her foot have been painful. Pt denies any injury to toes. No redness or swelling is noted to toes.

## 2016-02-26 NOTE — ED Provider Notes (Signed)
CSN: [id number]     Arrival date & time 02/26/16  1046 History   First MD Initiated Contact with Patient 02/26/16 1122     Chief Complaint  Patient presents with  . Toe Pain   (Consider location/radiation/quality/duration/timing/severity/associated sxs/prior Treatment) HPI Comments: Patient presents with complaint of right great toe, second toe, third toe pain for the past 3 days. Patient states the pain is worse with bearing weight and movement. She denies any injuries to the area but states that she is on her feet a lot. She's been taking ibuprofen without relief. No color change or swelling. Patient states that the pain radiates up her leg at times. No weakness, numbness, or tingling. Onset of symptoms acute. Course is constant. Nothing makes symptoms better.  Patient is a 25 y.o. female presenting with toe pain. The history is provided by the patient.  Toe Pain Associated symptoms include arthralgias. Pertinent negatives include no joint swelling, neck pain, numbness or weakness.    Past Medical History  Diagnosis Date  . Acid reflux   . Anxiety     hx of anxiety attack  . Gonorrhea    Past Surgical History  Procedure Laterality Date  . No past surgeries    . Wisdom tooth extraction     Family History  Problem Relation Age of Onset  . Other Neg Hx   . Asthma Sister   . Hypertension Brother   . Asthma Brother   . Diabetes Maternal Grandmother   . Diabetes Maternal Grandfather    Social History  Substance Use Topics  . Smoking status: Never Smoker   . Smokeless tobacco: Never Used  . Alcohol Use: No   OB History    Gravida Para Term Preterm AB TAB SAB Ectopic Multiple Living   3 3 3  0 0 0 0 0 0 3     Review of Systems  Constitutional: Negative for activity change.  Musculoskeletal: Positive for arthralgias. Negative for back pain, joint swelling, gait problem and neck pain.  Skin: Negative for wound.  Neurological: Negative for weakness and numbness.     Allergies  Review of patient's allergies indicates no known allergies.  Home Medications   Prior to Admission medications   Medication Sig Start Date End Date Taking? Authorizing Provider  Prenatal Vit-Fe Fumarate-FA (PRENATAL COMPLETE) 14-0.4 MG TABS Take 1 tablet by mouth daily. 06/05/14   Kaitlyn Szekalski, PA-C   BP 133/84 mmHg  Pulse 78  Temp(Src) 98.4 F (36.9 C) (Oral)  Resp 16  Ht 5\' 5"  (1.651 m)  Wt 90.719 kg  BMI 33.28 kg/m2  SpO2 99%  LMP 02/23/2016   Physical Exam  Constitutional: She appears well-developed and well-nourished.  HENT:  Head: Normocephalic and atraumatic.  Eyes: Pupils are equal, round, and reactive to light.  Neck: Normal range of motion. Neck supple.  Cardiovascular: Exam reveals no decreased pulses.   Musculoskeletal: She exhibits tenderness. She exhibits no edema.  Some mild tenderness with movement of her great, second, third toes. There is no swelling or signs of arthritis. No redness or warmth. No tenderness over the forefoot her bottom of the foot. No tenderness or swelling of the calf.  Neurological: She is alert. No sensory deficit.  Motor, sensation, and vascular distal to the injury is fully intact.   Skin: Skin is warm and dry.  Psychiatric: She has a normal mood and affect.  Nursing note and vitals reviewed.   ED Course  Procedures (including critical care time)  Imaging Review  Dg Foot Complete Right  02/26/2016  CLINICAL DATA:  Increasing toe pain for 2 weeks without trauma. EXAM: RIGHT FOOT COMPLETE - 3+ VIEW COMPARISON:  None. FINDINGS: There is no evidence of fracture or dislocation. There is no evidence of arthropathy or other focal bone abnormality. Soft tissues are unremarkable. IMPRESSION: Negative. Electronically Signed   By: Gerome Sam III M.D   On: 02/26/2016 12:02   I have personally reviewed and evaluated these images and lab results as part of my medical decision-making.  11:26 AM Patient seen and examined.  X-ray ordered.   Vital signs reviewed and are as follows: BP 133/84 mmHg  Pulse 78  Temp(Src) 98.4 F (36.9 C) (Oral)  Resp 16  Ht  (1.651 m)  Wt 90.719 kg  BMI 33.28 kg/m2  SpO2 99%  LMP 02/23/2016  Patient informed of x-ray results. Counseled on rice protocol. Encouraged use of NSAIDs. Encouraged PCP follow-up one week if not improved. Return to emergency department with worsening symptoms or other concerns.  MDM   Final diagnoses:  Pain of toe of right foot   Patient with toe pain. No indication of inflammatory arthritis or gout. No signs of cellulitis or infection. X-rays negative. Foot is neurovascularly intact.    Renne Crigler, PA-C 02/26/16 1317  Linwood Dibbles, MD 02/27/16 [phone]

## 2016-02-26 NOTE — Discharge Instructions (Signed)
Please read and follow all provided instructions.  Your diagnoses today include:  1. Pain of toe of right foot    Tests performed today include:  An x-ray of the affected area - does NOT show any broken bones  Vital signs. See below for your results today.   Medications prescribed:   Naproxen - anti-inflammatory pain medication  Do not exceed 500mg  naproxen every 12 hours, take with food  You have been prescribed an anti-inflammatory medication or NSAID. Take with food. Take smallest effective dose for the shortest duration needed for your pain. Stop taking if you experience stomach pain or vomiting.   Take any prescribed medications only as directed.  Home care instructions:   Follow any educational materials contained in this packet  Follow R.I.C.E. Protocol:  R - rest your injury   I  - use ice on injury without applying directly to skin  C - compress injury with bandage or splint  E - elevate the injury as much as possible  Follow-up instructions: Please follow-up with your primary care provider if you continue to have significant pain in 1 week. In this case you may have a more severe injury that requires further care.   Return instructions:   Please return if your toes or feet are numb or tingling, appear gray or blue, or you have severe pain (also elevate the leg and loosen splint or wrap if you were given one)  Please return to the Emergency Department if you experience worsening symptoms.   Please return if you have any other emergent concerns.  Additional Information:  Your vital signs today were: BP 133/84 mmHg   Pulse 78   Temp(Src) 98.4 F (36.9 C) (Oral)   Resp 16   Ht 5\' 5"  (1.651 m)   Wt 90.719 kg   BMI 33.28 kg/m2   SpO2 99%   LMP 02/23/2016 If your blood pressure (BP) was elevated above 135/85 this visit, please have this repeated by your doctor within one month.

## 2016-02-26 NOTE — ED Notes (Addendum)
See PA assessment. C/o pain to toes right foot. No deformity. No known injury.

## 2016-04-01 ENCOUNTER — Emergency Department (HOSPITAL_COMMUNITY)
Admission: EM | Admit: 2016-04-01 | Discharge: 2016-04-01 | Disposition: A | Discharge status: Home / Self Care | Payer: Medicaid Other | Attending: Emergency Medicine | Admitting: Emergency Medicine

## 2016-04-01 ENCOUNTER — Encounter (HOSPITAL_COMMUNITY): Payer: Self-pay | Admitting: Family Medicine

## 2016-04-01 DIAGNOSIS — N760 Acute vaginitis: Secondary | ICD-10-CM | POA: Diagnosis not present

## 2016-04-01 DIAGNOSIS — B9689 Other specified bacterial agents as the cause of diseases classified elsewhere: Secondary | ICD-10-CM

## 2016-04-01 DIAGNOSIS — N898 Other specified noninflammatory disorders of vagina: Secondary | ICD-10-CM | POA: Diagnosis present

## 2016-04-01 LAB — URINALYSIS, ROUTINE W REFLEX MICROSCOPIC
Bilirubin Urine: NEGATIVE
GLUCOSE, UA: NEGATIVE mg/dL
Hgb urine dipstick: NEGATIVE
KETONES UR: NEGATIVE mg/dL
NITRITE: NEGATIVE
PH: 7 (ref 5.0–8.0)
Protein, ur: NEGATIVE mg/dL
SPECIFIC GRAVITY, URINE: 1.009 (ref 1.005–1.030)

## 2016-04-01 LAB — WET PREP, GENITAL
SPERM: NONE SEEN
TRICH WET PREP: NONE SEEN
Yeast Wet Prep HPF POC: NONE SEEN

## 2016-04-01 LAB — URINE MICROSCOPIC-ADD ON: RBC / HPF: NONE SEEN RBC/hpf (ref 0–5)

## 2016-04-01 MED ORDER — METRONIDAZOLE 500 MG PO TABS: 500.0000 mg | ORAL_TABLET | Freq: Two times a day (BID) | ORAL | Status: DC

## 2016-04-01 NOTE — ED Provider Notes (Signed)
CSN: [id number]     Arrival date & time 04/01/16  1026 History   First MD Initiated Contact with Patient 04/01/16 1133     Chief Complaint  Patient presents with  . Urinary Frequency  . Vaginal Discharge     (Consider location/radiation/quality/duration/timing/severity/associated sxs/prior Treatment) HPI Comments: Patient presents to the emergency department with chief complaint of dysuria and vaginal discharge. She states that she has been having the symptoms intermittently 1 month. She denies any associated fevers chills. She states that her vaginal discharge is white and has foul odor.  She has not taken anything for her symptoms. There are no modifying factors. She denies any other associated symptoms.  The history is provided by the patient. No language interpreter was used.    Past Medical History  Diagnosis Date  . Acid reflux   . Anxiety     hx of anxiety attack  . Gonorrhea    Past Surgical History  Procedure Laterality Date  . No past surgeries    . Wisdom tooth extraction     Family History  Problem Relation Age of Onset  . Other Neg Hx   . Asthma Sister   . Hypertension Brother   . Asthma Brother   . Diabetes Maternal Grandmother   . Diabetes Maternal Grandfather    Social History  Substance Use Topics  . Smoking status: Never Smoker   . Smokeless tobacco: Never Used  . Alcohol Use: No   OB History    Gravida Para Term Preterm AB TAB SAB Ectopic Multiple Living   0 0 0 0 0 0 3     Review of Systems  Constitutional: Negative for fever and chills.  Respiratory: Negative for shortness of breath.   Cardiovascular: Negative for chest pain.  Gastrointestinal: Negative for nausea, vomiting, diarrhea and constipation.  Genitourinary: Positive for dysuria and vaginal discharge.  All other systems reviewed and are negative.     Allergies  Review of patient's allergies indicates no known allergies.  Home Medications   Prior to Admission  medications   Medication Sig Start Date End Date Taking? Authorizing Provider  naproxen (NAPROSYN) 500 MG tablet Take 1 tablet (500 mg total) by mouth 2 (two) times daily. 02/26/16   Renne Crigler, PA-C  Prenatal Vit-Fe Fumarate-FA (PRENATAL COMPLETE) 14-0.4 MG TABS Take 1 tablet by mouth daily. 06/05/14   Kaitlyn Szekalski, PA-C   BP 124/68 mmHg  Pulse 83  Temp(Src) 98.3 F (36.8 C) (Oral)  Resp 16  SpO2 100%  LMP 03/23/2016 Physical Exam  Constitutional: She is oriented to person, place, and time. She appears well-developed and well-nourished.  HENT:  Head: Normocephalic and atraumatic.  Eyes: Conjunctivae and EOM are normal. Pupils are equal, round, and reactive to light.  Neck: Normal range of motion. Neck supple.  Cardiovascular: Normal rate and regular rhythm.  Exam reveals no gallop and no friction rub.   No murmur heard. Pulmonary/Chest: Effort normal and breath sounds normal. No respiratory distress. She has no wheezes. She has no rales. She exhibits no tenderness.  Abdominal: Soft. Bowel sounds are normal. She exhibits no distension and no mass. There is no tenderness. There is no rebound and no guarding.  Genitourinary:  Pelvic exam chaperoned by female ER tech, no right or left adnexal tenderness, no uterine tenderness, moderate vaginal discharge, no bleeding, no CMT or friability, no foreign body, no injury to the external genitalia, no other significant findings   Musculoskeletal: Normal range of motion. She  exhibits no edema or tenderness.  Neurological: She is alert and oriented to person, place, and time.  Skin: Skin is warm and dry.  Psychiatric: She has a normal mood and affect. Her behavior is normal. Judgment and thought content normal.  Nursing note and vitals reviewed.   ED Course  Procedures (including critical care time) Results for orders placed or performed during the hospital encounter of 04/01/16  Wet prep, genital  Result Value Ref Range   Yeast Wet  Prep HPF POC NONE SEEN NONE SEEN   Trich, Wet Prep NONE SEEN NONE SEEN   Clue Cells Wet Prep HPF POC PRESENT (A) NONE SEEN   WBC, Wet Prep HPF POC MANY (A) NONE SEEN   Sperm NONE SEEN   Urinalysis, Routine w reflex microscopic- may I&O cath if menses  Result Value Ref Range   Color, Urine YELLOW YELLOW   APPearance CLOUDY (A) CLEAR   Specific Gravity, Urine 1.009 1.005 - 1.030   pH 7.0 5.0 - 8.0   Glucose, UA NEGATIVE NEGATIVE mg/dL   Hgb urine dipstick NEGATIVE NEGATIVE   Bilirubin Urine NEGATIVE NEGATIVE   Ketones, ur NEGATIVE NEGATIVE mg/dL   Protein, ur NEGATIVE NEGATIVE mg/dL   Nitrite NEGATIVE NEGATIVE   Leukocytes, UA SMALL (A) NEGATIVE  Urine microscopic-add on  Result Value Ref Range   Squamous Epithelial / LPF 0-5 (A) NONE SEEN   WBC, UA 0-5 0 - 5 WBC/hpf   RBC / HPF NONE SEEN 0 - 5 RBC/hpf   Bacteria, UA FEW (A) NONE SEEN   No results found.  I have personally reviewed and evaluated these images and lab results as part of my medical decision-making.   MDM   Final diagnoses:  BV (bacterial vaginosis)    Patient with vaginal discharge and vaginal itching. Urinalysis negative. Clue cells are seen on wet prep, will treat for bacterial vaginosis. Gonorrhea/chlamydia tests are pending.  No pelvic pain or bleeding.    Roxy Horsemanobert Messi Twedt, PA-C 04/01/16 1416  Arby BarretteMarcy Pfeiffer, MD 04/12/16 [phone]

## 2016-04-01 NOTE — Discharge Instructions (Signed)

## 2016-04-01 NOTE — ED Notes (Signed)
Pt here for urinary frequency and vaginal discharge, sts a while. sts foul odor.

## 2016-04-02 LAB — GC/CHLAMYDIA PROBE AMP (~~LOC~~) NOT AT ARMC
Chlamydia: NEGATIVE
Neisseria Gonorrhea: NEGATIVE

## 2016-08-15 NOTE — ED Triage Notes (Signed)
Pt to ED from EMS c/o L sided facial droop, chest pain, and generalized bodyaches. Chest pain is reproducible. Pt denies cough at home, has been having chills. No obvious facial droop on assessment

## 2016-08-16 ENCOUNTER — Emergency Department (HOSPITAL_COMMUNITY): Payer: Medicaid Other

## 2016-08-16 ENCOUNTER — Emergency Department (HOSPITAL_COMMUNITY)
Admission: EM | Admit: 2016-08-16 | Discharge: 2016-08-16 | Disposition: A | Discharge status: Home / Self Care | Payer: Medicaid Other | Attending: Emergency Medicine | Admitting: Emergency Medicine

## 2016-08-16 ENCOUNTER — Encounter (HOSPITAL_COMMUNITY): Payer: Self-pay | Admitting: *Deleted

## 2016-08-16 DIAGNOSIS — R0789 Other chest pain: Secondary | ICD-10-CM | POA: Insufficient documentation

## 2016-08-16 MED ORDER — NAPROXEN 500 MG PO TABS: 500.0000 mg | ORAL_TABLET | Freq: Two times a day (BID) | ORAL | 0 refills | Status: DC

## 2016-08-16 MED ORDER — KETOROLAC TROMETHAMINE 15 MG/ML IJ SOLN
15.0000 mg | Freq: Once | INTRAMUSCULAR | Status: AC
  Administered 2016-08-16: 15 mg via INTRAMUSCULAR
  Filled 2016-08-16: qty 1

## 2016-08-16 NOTE — ED Notes (Signed)
Patient transported to X-ray 

## 2016-08-16 NOTE — ED Provider Notes (Signed)
[facility]-EMERGENCY DEPT Provider Note   CSN: [id number] Arrival date & time: 08/16/16  0011  By signing my name below, I, Suzan SlickAshley N. Elon SpannerLeger, attest that this documentation has been prepared under the direction and in the presence of Shon Batonourtney F Horton, MD.  Electronically Signed: Suzan SlickAshley N. Elon SpannerLeger, ED Scribe. 08/16/16. 12:33 AM.    History   Chief Complaint Chief Complaint  Patient presents with  . Chest Pain   The history is provided by the patient. No language interpreter was used.    HPI Comments: [name] [name] is a 25 y.o. female without any pertinent past medical history who presents to the Emergency Department complaining of intermittent, unchanged L sided chest pain onset yesterday. However, chest pain has been constant this evening. She described chest pain as sharp/tight and rated 7/10. Discomfort is made worse with movement and deep palpation. No alleviating factors. She also reports intermittent dizziness. OTC Ibuprofen attempted prior to arrival without any improvement. No recent fever, chills, nausea, vomiting, or shortness of breath. No numbness or weakness. LNMP- End of September.  Per triage note, patient also concerned about left facial droop. No facial droop noted. She did not endorse this symptom to me.  PCP: Thad RangerWILLIAMS,CHARLES, MD    Past Medical History:  Diagnosis Date  . Acid reflux   . Anxiety    hx of anxiety attack  . Gonorrhea     Patient Active Problem List   Diagnosis Date Noted  . Labor and delivery, indication for care 01/11/2015  . ASCUS with positive high risk HPV cervical 12/14/2014  . Group B Streptococcus carrier, antepartum 12/08/2014  . Late prenatal care affecting pregnancy in second trimester, antepartum 10/10/2014  . Anemia 02/01/2014  . Short interval between pregnancies complicating pregnancy in second trimester, antepartum 10/03/2013  . Learning disability 08/20/2013    Past Surgical History:  Procedure Laterality Date  . NO PAST  SURGERIES    . WISDOM TOOTH EXTRACTION      OB History    Gravida Para Term Preterm AB Living   3 3 3  0 0 3   SAB TAB Ectopic Multiple Live Births   0 0 0 0 3       Home Medications    Prior to Admission medications   Medication Sig Start Date End Date Taking? Authorizing Provider  naproxen (NAPROSYN) 500 MG tablet Take 1 tablet (500 mg total) by mouth 2 (two) times daily. 08/16/16   Shon Batonourtney F Horton, MD    Family History Family History  Problem Relation Age of Onset  . Asthma Sister   . Hypertension Brother   . Asthma Brother   . Diabetes Maternal Grandmother   . Diabetes Maternal Grandfather   . Other Neg Hx     Social History Social History  Substance Use Topics  . Smoking status: Never Smoker  . Smokeless tobacco: Never Used  . Alcohol use No     Allergies   Review of patient's allergies indicates no known allergies.   Review of Systems Review of Systems  Constitutional: Negative for chills and fever.  Respiratory: Negative for shortness of breath.   Cardiovascular: Positive for chest pain.  Gastrointestinal: Negative for nausea and vomiting.  Neurological: Positive for dizziness.  All other systems reviewed and are negative.    Physical Exam Updated Vital Signs BP 111/63   Pulse 70   Temp 98.6 F (37 C) (Oral)   Resp 14   LMP 07/04/2016   SpO2 100%   Physical Exam  Constitutional: She is oriented to person, place, and time. She appears well-developed and well-nourished. No distress.  Overweight  HENT:  Head: Normocephalic and atraumatic.  Eyes: Pupils are equal, round, and reactive to light.  Cardiovascular: Normal rate, regular rhythm and normal heart sounds.   No murmur heard. Pulmonary/Chest: Effort normal. No respiratory distress. She has no wheezes. She exhibits tenderness.  Tenderness to palpation left chest wall without crepitus  Abdominal: Soft. Bowel sounds are normal. There is no tenderness. There is no guarding.    Neurological: She is alert and oriented to person, place, and time.  Fluent speech, cranial nerves II through XII intact, no facial droop noted  Skin: Skin is warm and dry.  Psychiatric: She has a normal mood and affect.  Nursing note and vitals reviewed.    ED Treatments / Results   DIAGNOSTIC STUDIES: Oxygen Saturation is 95% on RA, adequate by my interpretation.    COORDINATION OF CARE: 12:22 AM-Discussed treatment plan with pt at bedside and pt agreed to plan.     Labs (all labs ordered are listed, but only abnormal results are displayed) Labs Reviewed - No data to display  EKG  EKG Interpretation  Date/Time:  Monday August 16 2016 00:43:35 EDT Ventricular Rate:  68 PR Interval:    QRS Duration: 87 QT Interval:  391 QTC Calculation: 416 R Axis:   68 Text Interpretation:  Sinus rhythm No significant change since last tracing Confirmed by HORTON  MD, Toni Amend ([id number]) on 08/16/2016 1:59:01 AM       Radiology Dg Chest 2 View  Result Date: 08/16/2016 CLINICAL DATA:  25 year old female with chest pain and shortness of breath EXAM: CHEST  2 VIEW COMPARISON:  Chest CT dated 11/15/2015 FINDINGS: The heart size and mediastinal contours are within normal limits. Both lungs are clear. The visualized skeletal structures are unremarkable. IMPRESSION: No active cardiopulmonary disease. Electronically Signed   By: Elgie Collard M.D.   On: 08/16/2016 02:52    Procedures Procedures (including critical care time)  Medications Ordered in ED Medications  ketorolac (TORADOL) 15 MG/ML injection 15 mg (15 mg Intramuscular Given 08/16/16 0044)     Initial Impression / Assessment and Plan / ED Course  I have reviewed the triage vital signs and the nursing notes.  Pertinent labs & imaging results that were available during my care of the patient were reviewed by me and considered in my medical decision making (see chart for details).  Clinical Course    Patient presents with  chest pain. Nontoxic on exam. Vital signs reassuring. Chest pain is reproducible on exam. EKG and chest x-ray are reassuring.  She is PERC negative. Patient's pain improved with Toradol. Suspect musculoskeletal etiology. Low risk ACS. Will discharge her naproxen twice daily.  After history, exam, and medical workup I feel the patient has been appropriately medically screened and is safe for discharge home. Pertinent diagnoses were discussed with the patient. Patient was given return precautions.   Final Clinical Impressions(s) / ED Diagnoses   Final diagnoses:  Chest wall pain    New Prescriptions New Prescriptions   NAPROXEN (NAPROSYN) 500 MG TABLET    Take 1 tablet (500 mg total) by mouth 2 (two) times daily.   I personally performed the services described in this documentation, which was scribed in my presence. The recorded information has been reviewed and is accurate.    Shon Baton, MD 08/16/16 [phone]

## 2016-10-11 ENCOUNTER — Encounter: Payer: Medicaid Other | Admitting: Podiatry

## 2016-10-20 NOTE — Progress Notes (Signed)
Patient cancelled

## 2016-11-03 ENCOUNTER — Ambulatory Visit: Payer: Medicaid Other | Admitting: Podiatry

## 2018-09-06 ENCOUNTER — Emergency Department (HOSPITAL_COMMUNITY)
Admission: EM | Admit: 2018-09-06 | Discharge: 2018-09-06 | Disposition: A | Discharge status: Home / Self Care | Payer: Medicaid Other | Attending: Emergency Medicine | Admitting: Emergency Medicine

## 2018-09-06 DIAGNOSIS — H6692 Otitis media, unspecified, left ear: Secondary | ICD-10-CM | POA: Insufficient documentation

## 2018-09-06 DIAGNOSIS — H9202 Otalgia, left ear: Secondary | ICD-10-CM | POA: Diagnosis present

## 2018-09-06 DIAGNOSIS — H6690 Otitis media, unspecified, unspecified ear: Secondary | ICD-10-CM

## 2018-09-06 DIAGNOSIS — H6122 Impacted cerumen, left ear: Secondary | ICD-10-CM | POA: Diagnosis not present

## 2018-09-06 MED ORDER — AMOXICILLIN-POT CLAVULANATE 875-125 MG PO TABS: 1.0000 | ORAL_TABLET | Freq: Two times a day (BID) | ORAL | 0 refills | Status: DC

## 2018-09-06 NOTE — ED Provider Notes (Signed)
[facility] EMERGENCY DEPARTMENT Provider Note   CSN: [id number] Arrival date & time: 09/06/18  1107     History   Chief Complaint Chief Complaint  Patient presents with  . Otalgia    HPI [name] [name] is a 27 y.o. female.  HPI   27 year old female presents today with complaints of left-sided ear pain x1 week.  Patient notes the sensation popping in her ear, notes difficulty hearing out of her left ear and pain with manipulation of the ear.  She denies any trauma.  She notes a minor upper respiratory infection this week, she denies fever.  Past Medical History:  Diagnosis Date  . Acid reflux   . Anxiety    hx of anxiety attack  . Gonorrhea     Patient Active Problem List   Diagnosis Date Noted  . Labor and delivery, indication for care 01/11/2015  . ASCUS with positive high risk HPV cervical 12/14/2014  . Group B Streptococcus carrier, antepartum 12/08/2014  . Late prenatal care affecting pregnancy in second trimester, antepartum 10/10/2014  . Anemia 02/01/2014  . Short interval between pregnancies complicating pregnancy in second trimester, antepartum 10/03/2013  . Learning disability 08/20/2013    Past Surgical History:  Procedure Laterality Date  . NO PAST SURGERIES    . WISDOM TOOTH EXTRACTION       OB History    Gravida  3   Para  3   Term  3   Preterm  0   AB  0   Living  3     SAB  0   TAB  0   Ectopic  0   Multiple  0   Live Births  3            Home Medications    Prior to Admission medications   Medication Sig Start Date End Date Taking? Authorizing Provider  amoxicillin-clavulanate (AUGMENTIN) 875-125 MG tablet Take 1 tablet by mouth every 12 (twelve) hours. 09/06/18   Aalina Brege, Tinnie Gens, PA-C  naproxen (NAPROSYN) 500 MG tablet Take 1 tablet (500 mg total) by mouth 2 (two) times daily. 08/16/16   Horton, Mayer Masker, MD    Family History Family History  Problem Relation Age of Onset  . Asthma Sister   .  Hypertension Brother   . Asthma Brother   . Diabetes Maternal Grandmother   . Diabetes Maternal Grandfather   . Other Neg Hx     Social History Social History   Tobacco Use  . Smoking status: Never Smoker  . Smokeless tobacco: Never Used  Substance Use Topics  . Alcohol use: No  . Drug use: No     Allergies   Patient has no known allergies.   Review of Systems Review of Systems  All other systems reviewed and are negative.    Physical Exam Updated Vital Signs BP 128/78 (BP Location: Right Arm)   Pulse 93   Temp 98.6 F (37 C) (Oral)   Resp 16   SpO2 100%   Physical Exam  Constitutional: She is oriented to person, place, and time. She appears well-developed and well-nourished.  HENT:  Head: Normocephalic and atraumatic.  Impacted left external auditory canal--status post impaction erythematous bulging left TM intact external auditory canal normal  Eyes: Pupils are equal, round, and reactive to light. Conjunctivae are normal. Right eye exhibits no discharge. Left eye exhibits no discharge. No scleral icterus.  Neck: Normal range of motion. No JVD present. No tracheal deviation present.  Pulmonary/Chest: Effort normal. No stridor.  Neurological: She is alert and oriented to person, place, and time. Coordination normal.  Psychiatric: She has a normal mood and affect. Her behavior is normal. Judgment and thought content normal.  Nursing note and vitals reviewed.    ED Treatments / Results  Labs (all labs ordered are listed, but only abnormal results are displayed) Labs Reviewed - No data to display  EKG None  Radiology No results found.  Procedures .Ear Cerumen Removal Date/Time: 09/06/2018 11:54 AM Performed by: Eyvonne MechanicHedges, Deztinee Lohmeyer, PA-C Authorized by: Eyvonne MechanicHedges, Beya Tipps, PA-C   Consent:    Consent obtained:  Verbal   Consent given by:  Patient   Risks discussed:  Infection, bleeding, incomplete removal, dizziness, pain and TM perforation   Alternatives  discussed:  No treatment and alternative treatment Procedure details:    Location:  L ear   Procedure type: irrigation     Procedure type comment:  Curette and irrigation  Post-procedure details:    Inspection:  TM intact   Post-procedure hearing quality: persistently decreased    Patient tolerance of procedure:  Tolerated well, no immediate complications   (including critical care time)    Medications Ordered in ED Medications - No data to display   Initial Impression / Assessment and Plan / ED Course  I have reviewed the triage vital signs and the nursing notes.  Pertinent labs & imaging results that were available during my care of the patient were reviewed by me and considered in my medical decision making (see chart for details).      Discharge Meds: Augmentin  Assessment/Plan: 27 year old female presents today with left otitis media.  She will be treated with Augmentin, follow-up with ENT if she continues to have difficulty hearing out of the left ear.  Strict return precautions given, she verbalized understanding and agreement to today's plan had no further questions or concerns.    Final Clinical Impressions(s) / ED Diagnoses   Final diagnoses:  Acute otitis media, unspecified otitis media type    ED Discharge Orders         Ordered    amoxicillin-clavulanate (AUGMENTIN) 875-125 MG tablet  Every 12 hours     09/06/18 1151           Eyvonne MechanicHedges, Dohn Stclair, PA-C 09/06/18 1155    Gwyneth SproutPlunkett, Whitney, MD 09/12/18 1536

## 2018-09-06 NOTE — ED Triage Notes (Signed)
Pt in c/o left earache for 4 days

## 2018-09-06 NOTE — Discharge Instructions (Addendum)
Please read attached information. If you experience any new or worsening signs or symptoms please return to the emergency room for evaluation. Please follow-up with your primary care provider or specialist as discussed. Please use medication prescribed only as directed and discontinue taking if you have any concerning signs or symptoms.   °

## 2018-09-06 NOTE — ED Notes (Signed)
Declined W/C at D/C and was escorted to lobby by RN. 

## 2018-11-15 ENCOUNTER — Encounter (HOSPITAL_COMMUNITY): Payer: Self-pay | Admitting: Emergency Medicine

## 2018-11-15 ENCOUNTER — Ambulatory Visit (HOSPITAL_COMMUNITY)
Admission: EM | Admit: 2018-11-15 | Discharge: 2018-11-15 | Disposition: A | Discharge status: Home / Self Care | Payer: Medicaid Other | Attending: Family Medicine | Admitting: Family Medicine

## 2018-11-15 DIAGNOSIS — N760 Acute vaginitis: Secondary | ICD-10-CM | POA: Diagnosis not present

## 2018-11-15 DIAGNOSIS — H6122 Impacted cerumen, left ear: Secondary | ICD-10-CM | POA: Insufficient documentation

## 2018-11-15 DIAGNOSIS — N946 Dysmenorrhea, unspecified: Secondary | ICD-10-CM

## 2018-11-15 LAB — POCT URINALYSIS DIP (DEVICE)
Bilirubin Urine: NEGATIVE
GLUCOSE, UA: NEGATIVE mg/dL
HGB URINE DIPSTICK: NEGATIVE
Ketones, ur: NEGATIVE mg/dL
LEUKOCYTES UA: NEGATIVE
NITRITE: NEGATIVE
Protein, ur: NEGATIVE mg/dL
Specific Gravity, Urine: 1.02 (ref 1.005–1.030)
UROBILINOGEN UA: 0.2 mg/dL (ref 0.0–1.0)
pH: 7 (ref 5.0–8.0)

## 2018-11-15 LAB — POCT PREGNANCY, URINE: Preg Test, Ur: NEGATIVE

## 2018-11-15 MED ORDER — METRONIDAZOLE 500 MG PO TABS: 500.0000 mg | ORAL_TABLET | Freq: Two times a day (BID) | ORAL | 0 refills | Status: DC

## 2018-11-15 MED ORDER — FLUCONAZOLE 150 MG PO TABS: 150.0000 mg | ORAL_TABLET | Freq: Once | ORAL | 0 refills | Status: AC

## 2018-11-15 MED ORDER — NAPROXEN 500 MG PO TABS: 500.0000 mg | ORAL_TABLET | Freq: Two times a day (BID) | ORAL | 0 refills | Status: DC | PRN

## 2018-11-15 NOTE — ED Triage Notes (Signed)
Pt presents to [facility] for assessment of right flank pain, lower mid abdominal pain x 4 days.  Patient denies n/v/d, denies changes in urination

## 2018-11-15 NOTE — ED Notes (Signed)
Patient able to ambulate independently  

## 2018-11-15 NOTE — ED Provider Notes (Signed)
[facility]    CSN: [id number] Arrival date & time: 11/15/18  1001     History   Chief Complaint Chief Complaint  Patient presents with  . Back Pain    HPI [name] [name] is a 28 y.o. female.   This is the initial [facility] urgent care visit for this 28 year old woman complaining of three separate problems:  1)  Right neck pains:  Started 5 days ago, sharp, worse with movement, 7/10 in severity, also has posterior pharyngeal pain, no dysphagia, no fever or stiff neck;  Some right ear pain with decreased hearing on the left  2)  LLQ abdominal pain:  Sharp in character; no N,V,D;  No aggravating factors.  3)  Vaginal discharge:  White, no odor, associated with vaginal itching.  H/O BV.  No dysuria or hematuria; LMP late December 2019;  No recent sexual activity  G3P3, unemployed.     Past Medical History:  Diagnosis Date  . Acid reflux   . Anxiety    hx of anxiety attack  . Gonorrhea     Patient Active Problem List   Diagnosis Date Noted  . Labor and delivery, indication for care 01/11/2015  . ASCUS with positive high risk HPV cervical 12/14/2014  . Group B Streptococcus carrier, antepartum 12/08/2014  . Late prenatal care affecting pregnancy in second trimester, antepartum 10/10/2014  . Anemia 02/01/2014  . Short interval between pregnancies complicating pregnancy in second trimester, antepartum 10/03/2013  . Learning disability 08/20/2013    Past Surgical History:  Procedure Laterality Date  . NO PAST SURGERIES    . WISDOM TOOTH EXTRACTION      OB History    Gravida  3   Para  3   Term  3   Preterm  0   AB  0   Living  3     SAB  0   TAB  0   Ectopic  0   Multiple  0   Live Births  3            Home Medications    Prior to Admission medications   Medication Sig Start Date End Date Taking? Authorizing Provider  fluconazole (DIFLUCAN) 150 MG tablet Take 1 tablet (150 mg total) by mouth once for 1 dose. Repeat if needed  11/15/18 11/15/18  Elvina Sidle, MD  metroNIDAZOLE (FLAGYL) 500 MG tablet Take 1 tablet (500 mg total) by mouth 2 (two) times daily. 11/15/18   Elvina Sidle, MD  naproxen (NAPROSYN) 500 MG tablet Take 1 tablet (500 mg total) by mouth 2 (two) times daily as needed. 11/15/18   Elvina Sidle, MD    Family History Family History  Problem Relation Age of Onset  . Asthma Sister   . Hypertension Brother   . Asthma Brother   . Diabetes Maternal Grandmother   . Diabetes Maternal Grandfather   . Other Neg Hx     Social History Social History   Tobacco Use  . Smoking status: Never Smoker  . Smokeless tobacco: Never Used  Substance Use Topics  . Alcohol use: No  . Drug use: No     Allergies   Patient has no known allergies.   Review of Systems Review of Systems   Physical Exam Triage Vital Signs ED Triage Vitals  Enc Vitals Group     BP 11/15/18 1119 118/60     Pulse Rate 11/15/18 1119 64     Resp 11/15/18 1119 18  Temp 11/15/18 1119 (!) 97.2 F (36.2 C)     Temp Source 11/15/18 1119 Oral     SpO2 11/15/18 1119 100 %     Weight --      Height --      Head Circumference --      Peak Flow --      Pain Score 11/15/18 1120 7     Pain Loc --      Pain Edu? --      Excl. in GC? --    No data found.  Updated Vital Signs BP 118/60 (BP Location: Left Arm)   Pulse 64   Temp (!) 97.2 F (36.2 C) (Oral)   Resp 18   LMP 10/18/2018   SpO2 100%    Physical Exam Vitals signs and nursing note reviewed.  Constitutional:      Appearance: Normal appearance.  HENT:     Right Ear: There is impacted cerumen.     Left Ear: There is impacted cerumen.     Mouth/Throat:     Mouth: Mucous membranes are moist.     Pharynx: Posterior oropharyngeal erythema present.  Eyes:     Conjunctiva/sclera: Conjunctivae normal.  Neck:     Musculoskeletal: Normal range of motion and neck supple.     Comments: Tender right lateral neck Cardiovascular:     Rate and Rhythm: Normal  rate and regular rhythm.  Pulmonary:     Effort: Pulmonary effort is normal.     Breath sounds: Normal breath sounds.  Abdominal:     General: Bowel sounds are normal.     Tenderness: There is abdominal tenderness.     Comments: Tender both LQ's  Musculoskeletal: Normal range of motion.  Skin:    General: Skin is warm and dry.  Neurological:     General: No focal deficit present.     Mental Status: She is alert.  Psychiatric:        Mood and Affect: Mood normal.        Thought Content: Thought content normal.      UC Treatments / Results  Labs (all labs ordered are listed, but only abnormal results are displayed) Labs Reviewed  POC URINE PREG, ED  POCT URINALYSIS DIP (DEVICE)  POCT PREGNANCY, URINE    EKG None  Radiology No results found.  Procedures Procedures (including critical care time)  Medications Ordered in UC Medications - No data to display  Initial Impression / Assessment and Plan / UC Course  I have reviewed the triage vital signs and the nursing notes.  Pertinent labs & imaging results that were available during my care of the patient were reviewed by me and considered in my medical decision making (see chart for details).    Final Clinical Impressions(s) / UC Diagnoses   Final diagnoses:  Vaginitis and vulvovaginitis  Dysmenorrhea  Impacted cerumen of left ear   Discharge Instructions   None    ED Prescriptions    Medication Sig Dispense Auth. Provider   metroNIDAZOLE (FLAGYL) 500 MG tablet Take 1 tablet (500 mg total) by mouth 2 (two) times daily. 14 tablet Elvina SidleLauenstein, Dickie Labarre, MD   fluconazole (DIFLUCAN) 150 MG tablet Take 1 tablet (150 mg total) by mouth once for 1 dose. Repeat if needed 2 tablet Elvina SidleLauenstein, Arsh Feutz, MD   naproxen (NAPROSYN) 500 MG tablet Take 1 tablet (500 mg total) by mouth 2 (two) times daily as needed. 30 tablet Elvina SidleLauenstein, Ernisha Sorn, MD     Controlled Substance Prescriptions  [place] Controlled Substance Registry consulted? Not  Applicable   Elvina Sidle, MD 11/15/18 1227

## 2019-05-23 ENCOUNTER — Ambulatory Visit: Payer: Self-pay | Admitting: *Deleted

## 2019-05-23 NOTE — Telephone Encounter (Signed)
Patient is not a current patient and has an established PCP, Dr. Jeanie Cooks at [facility].Stated the office closed at 2:00p. She has been having intermittent sharp upper center chest pains lasting about one minute that increase with movement. b/p 158/110 earlier. No fever/difficulty breathing/dizziness/one-sided weakness. The pain does not radiate. Some nausea no vomiting no sweating. No numbness/tingling.She will call her pcp and utilize the nurse line regarding an appointment for tomorrow. Reviewed symptoms she should seek immediate evaluation for. Stated she understood.   Reason for Disposition . [1] Chest pain lasting < 5 minutes AND [2] NO chest pain or cardiac symptoms (e.g., breathing difficulty, sweating) now  Answer Assessment - Initial Assessment Questions 1. LOCATION: "Where does it hurt?"      Top center of chest 2. RADIATION: "Does the pain go anywhere else?" (e.g., into neck, jaw, arms, back)     no 3. ONSET: "When did the chest pain begin?" (Minutes, hours or days)      yesterday 4. PATTERN "Does the pain come and go, or has it been constant since it started?"  "Does it get worse with exertion?"      Comes and goes. With movement it gets worst 5. DURATION: "How long does it last" (e.g., seconds, minutes, hours)     About one minute 6. SEVERITY: "How bad is the pain?"  (e.g., Scale 1-10; mild, moderate, or severe)    - MILD (1-3): doesn't interfere with normal activities     - MODERATE (4-7): interferes with normal activities or awakens from sleep    - SEVERE (8-10): excruciating pain, unable to do any normal activities      7 7. CARDIAC RISK FACTORS: "Do you have any history of heart problems or risk factors for heart disease?" (e.g., angina, prior heart attack; diabetes, high blood pressure, high cholesterol, smoker, or strong family history of heart disease)     Not that she is aware of. 8. PULMONARY RISK FACTORS: "Do you have any history of lung disease?"  (e.g., blood  clots in lung, asthma, emphysema, birth control pills)    no 9. CAUSE: "What do you think is causing the chest pain?"    unsure 10. OTHER SYMPTOMS: "Do you have any other symptoms?" (e.g., dizziness, nausea, vomiting, sweating, fever, difficulty breathing, cough)      Nausea and headache. 11. PREGNANCY: "Is there any chance you are pregnant?" "When was your last menstrual period?"      no  Protocols used: CHEST PAIN-A-AH

## 2019-11-09 DIAGNOSIS — R05 Cough: Secondary | ICD-10-CM | POA: Diagnosis not present

## 2019-11-09 DIAGNOSIS — R6883 Chills (without fever): Secondary | ICD-10-CM | POA: Diagnosis not present

## 2019-11-09 DIAGNOSIS — U071 COVID-19: Secondary | ICD-10-CM | POA: Diagnosis not present

## 2019-11-09 DIAGNOSIS — J029 Acute pharyngitis, unspecified: Secondary | ICD-10-CM | POA: Diagnosis not present

## 2019-11-09 DIAGNOSIS — R509 Fever, unspecified: Secondary | ICD-10-CM | POA: Diagnosis not present

## 2019-11-22 DIAGNOSIS — L089 Local infection of the skin and subcutaneous tissue, unspecified: Secondary | ICD-10-CM | POA: Diagnosis not present

## 2019-11-22 DIAGNOSIS — U071 COVID-19: Secondary | ICD-10-CM | POA: Diagnosis not present

## 2019-12-28 DIAGNOSIS — Z6841 Body Mass Index (BMI) 40.0 and over, adult: Secondary | ICD-10-CM | POA: Diagnosis not present

## 2019-12-28 DIAGNOSIS — Z20828 Contact with and (suspected) exposure to other viral communicable diseases: Secondary | ICD-10-CM | POA: Diagnosis not present

## 2019-12-28 DIAGNOSIS — Z7189 Other specified counseling: Secondary | ICD-10-CM | POA: Diagnosis not present

## 2019-12-28 DIAGNOSIS — U071 COVID-19: Secondary | ICD-10-CM | POA: Diagnosis not present

## 2019-12-28 DIAGNOSIS — Z13220 Encounter for screening for lipoid disorders: Secondary | ICD-10-CM | POA: Diagnosis not present

## 2019-12-28 DIAGNOSIS — R7303 Prediabetes: Secondary | ICD-10-CM | POA: Diagnosis not present

## 2019-12-28 DIAGNOSIS — R071 Chest pain on breathing: Secondary | ICD-10-CM | POA: Diagnosis not present

## 2019-12-28 DIAGNOSIS — Z0000 Encounter for general adult medical examination without abnormal findings: Secondary | ICD-10-CM | POA: Diagnosis not present

## 2019-12-28 DIAGNOSIS — E559 Vitamin D deficiency, unspecified: Secondary | ICD-10-CM | POA: Diagnosis not present

## 2020-01-11 DIAGNOSIS — R7303 Prediabetes: Secondary | ICD-10-CM | POA: Diagnosis not present

## 2020-01-11 DIAGNOSIS — E7849 Other hyperlipidemia: Secondary | ICD-10-CM | POA: Diagnosis not present

## 2020-01-11 DIAGNOSIS — K219 Gastro-esophageal reflux disease without esophagitis: Secondary | ICD-10-CM | POA: Diagnosis not present

## 2020-01-11 DIAGNOSIS — G44229 Chronic tension-type headache, not intractable: Secondary | ICD-10-CM | POA: Diagnosis not present

## 2020-01-22 ENCOUNTER — Ambulatory Visit (HOSPITAL_COMMUNITY)
Admission: EM | Admit: 2020-01-22 | Discharge: 2020-01-22 | Disposition: A | Discharge status: Home / Self Care | Payer: Medicaid Other | Attending: Physician Assistant | Admitting: Physician Assistant

## 2020-01-22 ENCOUNTER — Other Ambulatory Visit: Payer: Self-pay

## 2020-01-22 ENCOUNTER — Encounter (HOSPITAL_COMMUNITY): Payer: Self-pay | Admitting: Emergency Medicine

## 2020-01-22 DIAGNOSIS — H6123 Impacted cerumen, bilateral: Secondary | ICD-10-CM | POA: Diagnosis not present

## 2020-01-22 DIAGNOSIS — H6503 Acute serous otitis media, bilateral: Secondary | ICD-10-CM | POA: Diagnosis not present

## 2020-01-22 MED ORDER — FLUTICASONE PROPIONATE 50 MCG/ACT NA SUSP: 1.0000 | Freq: Every day | NASAL | 0 refills | Status: DC

## 2020-01-22 MED ORDER — IBUPROFEN 600 MG PO TABS: 600.0000 mg | ORAL_TABLET | Freq: Four times a day (QID) | ORAL | 0 refills | Status: DC | PRN

## 2020-01-22 MED ORDER — CARBAMIDE PEROXIDE 6.5 % OT SOLN: 5.0000 [drp] | Freq: Two times a day (BID) | OTIC | 0 refills | Status: DC

## 2020-01-22 MED ORDER — CETIRIZINE HCL 10 MG PO TABS: 10.0000 mg | ORAL_TABLET | Freq: Every day | ORAL | 0 refills | Status: DC

## 2020-01-22 NOTE — ED Notes (Signed)
Bilat ears lavaged for cerumen removal. Large amount of cerumen from both ears, left > right. Pt tolerated well.

## 2020-01-22 NOTE — Discharge Instructions (Addendum)
Place 5 drops of the Debrox in your right ear twice a day.  I would like for you to then allow water from the shower to drain into the ear.  This will help break up any remaining wax.  Use the Flonase and take the Zyrtec daily. I want you to take  ibuprofen/Advil every 6-8 hours for pain  If you are not having improvement in your hearing and pain in 4 days please return or follow-up with primary care

## 2020-01-22 NOTE — ED Provider Notes (Signed)
[facility]    CSN: [id number] Arrival date & time: 01/22/20  1749      History   Chief Complaint Chief Complaint  Patient presents with  . Otalgia    HPI [name] [name] is a 29 y.o. female.   Patient reports to urgent care for bilateral ear pain.  She also reports decreased hearing.  She reports her right ear is more sore and painful than her left.  Pain has been getting worse over the last 3 to 4 days.  She reports a history of having issues with her years.  Patient also reports decreased hearing bilaterally right worse than left.  Per chart review 2019 she was seen in emergency department for ear pain presenting with impacted cerumen.  Removal at that time revealed otitis media.  Denies fever, chills.  Denies nausea or vomiting.  Denies recent upper respiratory symptoms.     Past Medical History:  Diagnosis Date  . Acid reflux   . Anxiety    hx of anxiety attack  . Gonorrhea     Patient Active Problem List   Diagnosis Date Noted  . Labor and delivery, indication for care 01/11/2015  . ASCUS with positive high risk HPV cervical 12/14/2014  . Group B Streptococcus carrier, antepartum 12/08/2014  . Late prenatal care affecting pregnancy in second trimester, antepartum 10/10/2014  . Anemia 02/01/2014  . Short interval between pregnancies complicating pregnancy in second trimester, antepartum 10/03/2013  . Learning disability 08/20/2013    Past Surgical History:  Procedure Laterality Date  . NO PAST SURGERIES    . WISDOM TOOTH EXTRACTION      OB History    Gravida  3   Para  3   Term  3   Preterm  0   AB  0   Living  3     SAB  0   TAB  0   Ectopic  0   Multiple  0   Live Births  3            Home Medications    Prior to Admission medications   Medication Sig Start Date End Date Taking? Authorizing Provider  carbamide peroxide (DEBROX) 6.5 % OTIC solution Place 5 drops into the right ear 2 (two) times daily. 01/22/20   Shamecca Whitebread,  Veryl Speak, PA-C  cetirizine (ZYRTEC ALLERGY) 10 MG tablet Take 1 tablet (10 mg total) by mouth daily for 14 days. 01/22/20 02/05/20  Everhett Bozard, Veryl Speak, PA-C  fluticasone (FLONASE) 50 MCG/ACT nasal spray Place 1 spray into both nostrils daily. 01/22/20   Saralee Bolick, Veryl Speak, PA-C  ibuprofen (ADVIL) 600 MG tablet Take 1 tablet (600 mg total) by mouth every 6 (six) hours as needed. 01/22/20   Raney Antwine, Veryl Speak, PA-C  metroNIDAZOLE (FLAGYL) 500 MG tablet Take 1 tablet (500 mg total) by mouth 2 (two) times daily. Patient not taking: Reported on 01/22/2020 11/15/18   Elvina Sidle, MD  naproxen (NAPROSYN) 500 MG tablet Take 1 tablet (500 mg total) by mouth 2 (two) times daily as needed. 11/15/18   Elvina Sidle, MD    Family History Family History  Problem Relation Age of Onset  . Asthma Sister   . Hypertension Brother   . Asthma Brother   . Diabetes Maternal Grandmother   . Diabetes Maternal Grandfather   . Other Neg Hx     Social History Social History   Tobacco Use  . Smoking status: Never Smoker  . Smokeless tobacco: Never Used  Substance Use Topics  . Alcohol use: No  . Drug use: No     Allergies   Patient has no known allergies.   Review of Systems Review of Systems  Constitutional: Negative for chills and fever.  HENT: Positive for ear pain and hearing loss. Negative for congestion, ear discharge, postnasal drip, rhinorrhea, sinus pressure, sinus pain, sneezing, sore throat and tinnitus.   Respiratory: Negative for cough and shortness of breath.   Cardiovascular: Negative for chest pain.  Neurological: Negative for dizziness and headaches.     Physical Exam Triage Vital Signs ED Triage Vitals  Enc Vitals Group     BP 01/22/20 1812 (!) 166/91     Pulse Rate 01/22/20 1812 95     Resp 01/22/20 1812 18     Temp 01/22/20 1812 98.3 F (36.8 C)     Temp Source 01/22/20 1812 Oral     SpO2 01/22/20 1812 97 %     Weight --      Height --      Head Circumference --      Peak Flow --       Pain Score 01/22/20 1813 6     Pain Loc --      Pain Edu? --      Excl. in GC? --    No data found.  Updated Vital Signs BP (!) 166/91 (BP Location: Right Arm)   Pulse 95   Temp 98.3 F (36.8 C) (Oral)   Resp 18   SpO2 97%   Visual Acuity Right Eye Distance:   Left Eye Distance:   Bilateral Distance:    Right Eye Near:   Left Eye Near:    Bilateral Near:     Physical Exam Vitals and nursing note reviewed.  Constitutional:      General: She is not in acute distress.    Appearance: She is well-developed.  HENT:     Head: Normocephalic and atraumatic.     Ears:     Comments: Bilateral impacted cerumen on initial presentation.  Cerumen from left ear was disimpacted.  Tympanic membrane pearly gray on the left with serous fluid present..  Incomplete removal in the right ear unable to visualize tympanic membrane the exception of small margin of the superior aspect no obvious erythema. Eyes:     Conjunctiva/sclera: Conjunctivae normal.  Cardiovascular:     Rate and Rhythm: Normal rate and regular rhythm.     Heart sounds: No murmur.  Pulmonary:     Effort: Pulmonary effort is normal. No respiratory distress.     Breath sounds: Normal breath sounds.  Abdominal:     Palpations: Abdomen is soft.     Tenderness: There is no abdominal tenderness.  Musculoskeletal:     Cervical back: Neck supple.  Skin:    General: Skin is warm and dry.  Neurological:     Mental Status: She is alert.      UC Treatments / Results  Labs (all labs ordered are listed, but only abnormal results are displayed) Labs Reviewed - No data to display  EKG   Radiology No results found.  Procedures Procedures (including critical care time)  Medications Ordered in UC Medications - No data to display  Initial Impression / Assessment and Plan / UC Course  I have reviewed the triage vital signs and the nursing notes.  Pertinent labs & imaging results that were available during my care  of the patient were reviewed by me and considered in  my medical decision making (see chart for details).    #Bilateral cerumen impaction #Serous otitis  Patient is a 29 year old presenting with acute otalgia.  Physical exam shows severe impaction the right ear.  This was partially removed however after irrigation and efforts with curette cerumen was too closed and membrane to continue.  Do believe that this is causing her pain is less likely that she has otitis media at this time.  Will send out with Debrox and treatment plan for alleviating serous otitis.  To follow-up if continued severe pain despite treatment.  Ibuprofen for pain. Final Clinical Impressions(s) / UC Diagnoses   Final diagnoses:  Bilateral impacted cerumen  Non-recurrent acute serous otitis media of both ears     Discharge Instructions     Place 5 drops of the Debrox in your right ear twice a day.  I would like for you to then allow water from the shower to drain into the ear.  This will help break up any remaining wax.  Use the Flonase and take the Zyrtec daily. I want you to take  ibuprofen/Advil every 6-8 hours for pain  If you are not having improvement in your hearing and pain in 4 days please return or follow-up with primary care      ED Prescriptions    Medication Sig Dispense Auth. Provider   carbamide peroxide (DEBROX) 6.5 % OTIC solution Place 5 drops into the right ear 2 (two) times daily. 15 mL Vennesa Bastedo, Marguerita Beards, PA-C   fluticasone (FLONASE) 50 MCG/ACT nasal spray Place 1 spray into both nostrils daily. 11.1 mL Beverley Allender, Marguerita Beards, PA-C   cetirizine (ZYRTEC ALLERGY) 10 MG tablet Take 1 tablet (10 mg total) by mouth daily for 14 days. 14 tablet Astra Gregg, Marguerita Beards, PA-C   ibuprofen (ADVIL) 600 MG tablet Take 1 tablet (600 mg total) by mouth every 6 (six) hours as needed. 30 tablet Cornelio Parkerson, Marguerita Beards, PA-C     PDMP not reviewed this encounter.   Purnell Shoemaker, PA-C 01/22/20 2316

## 2020-01-22 NOTE — ED Triage Notes (Signed)
Pt here for right ear pain x 3 days; pt sts now some pain in both ears

## 2020-01-25 DIAGNOSIS — R7303 Prediabetes: Secondary | ICD-10-CM | POA: Diagnosis not present

## 2020-01-25 DIAGNOSIS — H6690 Otitis media, unspecified, unspecified ear: Secondary | ICD-10-CM | POA: Diagnosis not present

## 2020-01-25 DIAGNOSIS — H6121 Impacted cerumen, right ear: Secondary | ICD-10-CM | POA: Diagnosis not present

## 2020-02-06 DIAGNOSIS — Z20822 Contact with and (suspected) exposure to covid-19: Secondary | ICD-10-CM | POA: Diagnosis not present

## 2020-02-06 DIAGNOSIS — R05 Cough: Secondary | ICD-10-CM | POA: Diagnosis not present

## 2020-02-06 DIAGNOSIS — J029 Acute pharyngitis, unspecified: Secondary | ICD-10-CM | POA: Diagnosis not present

## 2020-02-06 DIAGNOSIS — J069 Acute upper respiratory infection, unspecified: Secondary | ICD-10-CM | POA: Diagnosis not present

## 2020-02-08 DIAGNOSIS — E7849 Other hyperlipidemia: Secondary | ICD-10-CM | POA: Diagnosis not present

## 2020-02-08 DIAGNOSIS — K219 Gastro-esophageal reflux disease without esophagitis: Secondary | ICD-10-CM | POA: Diagnosis not present

## 2020-02-08 DIAGNOSIS — R7303 Prediabetes: Secondary | ICD-10-CM | POA: Diagnosis not present

## 2020-03-06 ENCOUNTER — Emergency Department (HOSPITAL_COMMUNITY)
Admission: EM | Admit: 2020-03-06 | Discharge: 2020-03-06 | Disposition: A | Discharge status: Home / Self Care | Payer: Medicaid Other | Attending: Emergency Medicine | Admitting: Emergency Medicine

## 2020-03-06 ENCOUNTER — Emergency Department (HOSPITAL_COMMUNITY): Payer: Medicaid Other

## 2020-03-06 ENCOUNTER — Other Ambulatory Visit: Payer: Self-pay

## 2020-03-06 DIAGNOSIS — R0789 Other chest pain: Secondary | ICD-10-CM | POA: Diagnosis not present

## 2020-03-06 DIAGNOSIS — R079 Chest pain, unspecified: Secondary | ICD-10-CM

## 2020-03-06 DIAGNOSIS — E669 Obesity, unspecified: Secondary | ICD-10-CM | POA: Diagnosis not present

## 2020-03-06 DIAGNOSIS — Z6841 Body Mass Index (BMI) 40.0 and over, adult: Secondary | ICD-10-CM | POA: Insufficient documentation

## 2020-03-06 LAB — CBC
HCT: 37.6 % (ref 36.0–46.0)
Hemoglobin: 11.2 g/dL — ABNORMAL LOW (ref 12.0–15.0)
MCH: 25.5 pg — ABNORMAL LOW (ref 26.0–34.0)
MCHC: 29.8 g/dL — ABNORMAL LOW (ref 30.0–36.0)
MCV: 85.6 fL (ref 80.0–100.0)
Platelets: 451 10*3/uL — ABNORMAL HIGH (ref 150–400)
RBC: 4.39 MIL/uL (ref 3.87–5.11)
RDW: 15.3 % (ref 11.5–15.5)
WBC: 7.6 10*3/uL (ref 4.0–10.5)
nRBC: 0 % (ref 0.0–0.2)

## 2020-03-06 LAB — BASIC METABOLIC PANEL
Anion gap: 12 (ref 5–15)
BUN: 8 mg/dL (ref 6–20)
CO2: 24 mmol/L (ref 22–32)
Calcium: 9 mg/dL (ref 8.9–10.3)
Chloride: 102 mmol/L (ref 98–111)
Creatinine, Ser: 0.76 mg/dL (ref 0.44–1.00)
GFR calc Af Amer: >60 mL/min (ref >60)
GFR calc non Af Amer: >60 mL/min (ref >60)
Glucose, Bld: 101 mg/dL — ABNORMAL HIGH (ref 70–99)
Potassium: 4.3 mmol/L (ref 3.5–5.1)
Sodium: 138 mmol/L (ref 135–145)

## 2020-03-06 LAB — I-STAT BETA HCG BLOOD, ED (MC, WL, AP ONLY): I-stat hCG, quantitative: <5 m[IU]/mL (ref <5)

## 2020-03-06 LAB — TROPONIN I (HIGH SENSITIVITY): Troponin I (High Sensitivity): 3 ng/L (ref <18)

## 2020-03-06 MED ORDER — METHOCARBAMOL 500 MG PO TABS: 500.0000 mg | ORAL_TABLET | Freq: Three times a day (TID) | ORAL | 0 refills | Status: DC | PRN

## 2020-03-06 MED ORDER — SODIUM CHLORIDE 0.9% FLUSH: 3.0000 mL | Freq: Once | INTRAVENOUS | Status: DC

## 2020-03-06 NOTE — ED Notes (Signed)
Patient transported to X-ray 

## 2020-03-06 NOTE — ED Triage Notes (Signed)
C/O chest pain x 2 days; unrelieved by tylenol. Denies prior medical hx.

## 2020-03-06 NOTE — ED Provider Notes (Signed)
[facility] EMERGENCY DEPARTMENT Provider Note   CSN: [id number] Arrival date & time: 03/06/20  2585     History Chief Complaint  Patient presents with  . Chest Pain    [name] [name] is a 29 y.o. female.  HPI Patient presents with chest pain.  Is had for the last 2 days.  Comes and goes.  States worse laying down.  States that it will last few seconds.  States it is in her both arms chest and back.  No fevers.  No shortness of breath.  Worse with movements to.  No family history of early cardiac disease.  Does not smoke.  No swelling her legs.  No recent travel.  Not on hormonal therapy.    Past Medical History:  Diagnosis Date  . Acid reflux   . Anxiety    hx of anxiety attack  . Gonorrhea     Patient Active Problem List   Diagnosis Date Noted  . Labor and delivery, indication for care 01/11/2015  . ASCUS with positive high risk HPV cervical 12/14/2014  . Group B Streptococcus carrier, antepartum 12/08/2014  . Late prenatal care affecting pregnancy in second trimester, antepartum 10/10/2014  . Anemia 02/01/2014  . Short interval between pregnancies complicating pregnancy in second trimester, antepartum 10/03/2013  . Learning disability 08/20/2013    Past Surgical History:  Procedure Laterality Date  . NO PAST SURGERIES    . WISDOM TOOTH EXTRACTION       OB History    Gravida  3   Para  3   Term  3   Preterm  0   AB  0   Living  3     SAB  0   TAB  0   Ectopic  0   Multiple  0   Live Births  3           Family History  Problem Relation Age of Onset  . Asthma Sister   . Hypertension Brother   . Asthma Brother   . Diabetes Maternal Grandmother   . Diabetes Maternal Grandfather   . Other Neg Hx     Social History   Tobacco Use  . Smoking status: Never Smoker  . Smokeless tobacco: Never Used  Substance Use Topics  . Alcohol use: No  . Drug use: No    Home Medications Prior to Admission medications   Medication  Sig Start Date End Date Taking? Authorizing Provider  carbamide peroxide (DEBROX) 6.5 % OTIC solution Place 5 drops into the right ear 2 (two) times daily. 01/22/20   Darr, Veryl Speak, PA-C  cetirizine (ZYRTEC ALLERGY) 10 MG tablet Take 1 tablet (10 mg total) by mouth daily for 14 days. 01/22/20 02/05/20  Darr, Veryl Speak, PA-C  fluticasone (FLONASE) 50 MCG/ACT nasal spray Place 1 spray into both nostrils daily. 01/22/20   Darr, Veryl Speak, PA-C  ibuprofen (ADVIL) 600 MG tablet Take 1 tablet (600 mg total) by mouth every 6 (six) hours as needed. 01/22/20   Darr, Veryl Speak, PA-C  methocarbamol (ROBAXIN) 500 MG tablet Take 1 tablet (500 mg total) by mouth every 8 (eight) hours as needed for muscle spasms. 03/06/20   Benjiman Core, MD  metroNIDAZOLE (FLAGYL) 500 MG tablet Take 1 tablet (500 mg total) by mouth 2 (two) times daily. Patient not taking: Reported on 01/22/2020 11/15/18   Elvina Sidle, MD  naproxen (NAPROSYN) 500 MG tablet Take 1 tablet (500 mg total) by mouth 2 (two) times daily  as needed. 11/15/18   Robyn Haber, MD    Allergies    Patient has no known allergies.  Review of Systems   Review of Systems  Constitutional: Negative for appetite change.  HENT: Negative for congestion.   Respiratory: Negative for shortness of breath.   Cardiovascular: Positive for chest pain. Negative for leg swelling.  Gastrointestinal: Negative for abdominal pain.  Genitourinary: Negative for flank pain.  Musculoskeletal: Positive for back pain.  Neurological: Negative for weakness.  Hematological: Negative for adenopathy.  Psychiatric/Behavioral: Negative for confusion.    Physical Exam Updated Vital Signs BP 108/83 (BP Location: Right Arm)   Pulse 79   Temp 98.8 F (37.1 C) (Oral)   Resp 17   Ht 5\' 5"  (1.651 m)   Wt 122.5 kg   LMP  (LMP Unknown)   SpO2 100%   BMI 44.93 kg/m   Physical Exam Vitals and nursing note reviewed.  Constitutional:      Appearance: She is obese.  HENT:     Head:  Atraumatic.  Eyes:     Extraocular Movements: Extraocular movements intact.  Cardiovascular:     Rate and Rhythm: Normal rate and regular rhythm.  Pulmonary:     Breath sounds: Normal breath sounds.  Chest:     Chest wall: No deformity.     Comments: Tenderness on anterior upper chest wall.  No rash.  No deformity. Abdominal:     Tenderness: There is no abdominal tenderness.  Musculoskeletal:     Cervical back: Neck supple.     Right lower leg: No edema.     Left lower leg: No edema.  Skin:    General: Skin is warm.     Capillary Refill: Capillary refill takes less than 2 seconds.  Neurological:     Mental Status: She is alert.     ED Results / Procedures / Treatments   Labs (all labs ordered are listed, but only abnormal results are displayed) Labs Reviewed  BASIC METABOLIC PANEL - Abnormal; Notable for the following components:      Result Value   Glucose, Bld 101 (*)    All other components within normal limits  CBC - Abnormal; Notable for the following components:   Hemoglobin 11.2 (*)    MCH 25.5 (*)    MCHC 29.8 (*)    Platelets 451 (*)    All other components within normal limits  I-STAT BETA HCG BLOOD, ED ([facility], [facility], [facility] ONLY)  TROPONIN I (HIGH SENSITIVITY)    EKG EKG Interpretation  Date/Time:  Thursday Mar 06 2020 09:38:56 EDT Ventricular Rate:  69 PR Interval:  144 QRS Duration: 74 QT Interval:  358 QTC Calculation: 383 R Axis:   44 Text Interpretation: Normal sinus rhythm with sinus arrhythmia Normal ECG Confirmed by Davonna Belling [phone]) on 03/06/2020 10:42:46 AM   Radiology DG Chest 2 View  Result Date: 03/06/2020 CLINICAL DATA:  Chest, bilateral arm and upper back pain for 3 days. EXAM: CHEST - 2 VIEW COMPARISON:  PA and lateral chest 08/16/2016. FINDINGS: Lungs clear. Heart size normal. No pneumothorax or pleural fluid. No acute or focal bony abnormality. IMPRESSION: Negative chest. Electronically Signed   By: Inge Rise M.D.   On:  03/06/2020 10:49    Procedures Procedures (including critical care time)  Medications Ordered in ED Medications  sodium chloride flush (NS) 0.9 % injection 3 mL (has no administration in time range)    ED Course  I have reviewed the triage vital signs  and the nursing notes.  Pertinent labs & imaging results that were available during my care of the patient were reviewed by me and considered in my medical decision making (see chart for details).    MDM Rules/Calculators/A&P                      Patient presents with sharp upper chest pain.  Goes to arms and back.  Worse with movements.  EKG reassuring.  Troponin negative.  X-ray reassuring.  Think more likely something musculoskeletal.  No swelling her legs.  Doubt pulmonary embolism.  Will treat symptomatically with Motrin Tylenol and muscle relaxers.  Heart pathway score of 1. Final Clinical Impression(s) / ED Diagnoses Final diagnoses:  Nonspecific chest pain    Rx / DC Orders ED Discharge Orders         Ordered    methocarbamol (ROBAXIN) 500 MG tablet  Every 8 hours PRN     03/06/20 1149           Benjiman Core, MD 03/06/20 1152

## 2020-03-13 DIAGNOSIS — E7849 Other hyperlipidemia: Secondary | ICD-10-CM | POA: Diagnosis not present

## 2020-03-13 DIAGNOSIS — K219 Gastro-esophageal reflux disease without esophagitis: Secondary | ICD-10-CM | POA: Diagnosis not present

## 2020-04-01 DIAGNOSIS — E559 Vitamin D deficiency, unspecified: Secondary | ICD-10-CM | POA: Diagnosis not present

## 2020-04-01 DIAGNOSIS — K219 Gastro-esophageal reflux disease without esophagitis: Secondary | ICD-10-CM | POA: Diagnosis not present

## 2020-04-01 DIAGNOSIS — E7849 Other hyperlipidemia: Secondary | ICD-10-CM | POA: Diagnosis not present

## 2020-04-01 DIAGNOSIS — R7303 Prediabetes: Secondary | ICD-10-CM | POA: Diagnosis not present

## 2020-04-24 DIAGNOSIS — Z419 Encounter for procedure for purposes other than remedying health state, unspecified: Secondary | ICD-10-CM | POA: Diagnosis not present

## 2020-05-01 DIAGNOSIS — F341 Dysthymic disorder: Secondary | ICD-10-CM | POA: Diagnosis not present

## 2020-05-09 DIAGNOSIS — F341 Dysthymic disorder: Secondary | ICD-10-CM | POA: Diagnosis not present

## 2020-05-16 DIAGNOSIS — F341 Dysthymic disorder: Secondary | ICD-10-CM | POA: Diagnosis not present

## 2020-05-23 DIAGNOSIS — F341 Dysthymic disorder: Secondary | ICD-10-CM | POA: Diagnosis not present

## 2020-05-25 DIAGNOSIS — Z419 Encounter for procedure for purposes other than remedying health state, unspecified: Secondary | ICD-10-CM | POA: Diagnosis not present

## 2020-05-30 DIAGNOSIS — F341 Dysthymic disorder: Secondary | ICD-10-CM | POA: Diagnosis not present

## 2020-06-13 DIAGNOSIS — F341 Dysthymic disorder: Secondary | ICD-10-CM | POA: Diagnosis not present

## 2020-06-20 DIAGNOSIS — F341 Dysthymic disorder: Secondary | ICD-10-CM | POA: Diagnosis not present

## 2020-06-25 DIAGNOSIS — Z419 Encounter for procedure for purposes other than remedying health state, unspecified: Secondary | ICD-10-CM | POA: Diagnosis not present

## 2020-07-01 DIAGNOSIS — R0789 Other chest pain: Secondary | ICD-10-CM | POA: Diagnosis not present

## 2020-07-01 DIAGNOSIS — R7303 Prediabetes: Secondary | ICD-10-CM | POA: Diagnosis not present

## 2020-07-01 DIAGNOSIS — K219 Gastro-esophageal reflux disease without esophagitis: Secondary | ICD-10-CM | POA: Diagnosis not present

## 2020-07-01 DIAGNOSIS — Z6841 Body Mass Index (BMI) 40.0 and over, adult: Secondary | ICD-10-CM | POA: Diagnosis not present

## 2020-07-01 DIAGNOSIS — Z124 Encounter for screening for malignant neoplasm of cervix: Secondary | ICD-10-CM | POA: Diagnosis not present

## 2020-07-04 DIAGNOSIS — F341 Dysthymic disorder: Secondary | ICD-10-CM | POA: Diagnosis not present

## 2020-07-11 DIAGNOSIS — F341 Dysthymic disorder: Secondary | ICD-10-CM | POA: Diagnosis not present

## 2020-07-15 ENCOUNTER — Ambulatory Visit: Payer: Medicaid Other | Admitting: Obstetrics and Gynecology

## 2020-07-16 DIAGNOSIS — Z20822 Contact with and (suspected) exposure to covid-19: Secondary | ICD-10-CM | POA: Diagnosis not present

## 2020-07-16 DIAGNOSIS — R05 Cough: Secondary | ICD-10-CM | POA: Diagnosis not present

## 2020-07-16 DIAGNOSIS — R0981 Nasal congestion: Secondary | ICD-10-CM | POA: Diagnosis not present

## 2020-07-25 DIAGNOSIS — Z419 Encounter for procedure for purposes other than remedying health state, unspecified: Secondary | ICD-10-CM | POA: Diagnosis not present

## 2020-07-25 DIAGNOSIS — F341 Dysthymic disorder: Secondary | ICD-10-CM | POA: Diagnosis not present

## 2020-07-29 ENCOUNTER — Encounter: Payer: Self-pay | Admitting: Obstetrics

## 2020-07-29 ENCOUNTER — Other Ambulatory Visit: Payer: Self-pay

## 2020-07-29 ENCOUNTER — Ambulatory Visit (INDEPENDENT_AMBULATORY_CARE_PROVIDER_SITE_OTHER): Payer: Medicaid Other | Admitting: Obstetrics

## 2020-07-29 ENCOUNTER — Other Ambulatory Visit (HOSPITAL_COMMUNITY)
Admission: RE | Admit: 2020-07-29 | Discharge: 2020-07-29 | Disposition: A | Discharge status: Home / Self Care | Payer: Medicaid Other | Source: Ambulatory Visit | Attending: Obstetrics | Admitting: Obstetrics

## 2020-07-29 VITALS — BP 120/87 | HR 65 | Ht 65.0 in | Wt 282.1 lb

## 2020-07-29 DIAGNOSIS — Z3009 Encounter for other general counseling and advice on contraception: Secondary | ICD-10-CM | POA: Diagnosis not present

## 2020-07-29 DIAGNOSIS — Z01419 Encounter for gynecological examination (general) (routine) without abnormal findings: Secondary | ICD-10-CM | POA: Insufficient documentation

## 2020-07-29 DIAGNOSIS — Z113 Encounter for screening for infections with a predominantly sexual mode of transmission: Secondary | ICD-10-CM

## 2020-07-29 DIAGNOSIS — Z0000 Encounter for general adult medical examination without abnormal findings: Secondary | ICD-10-CM

## 2020-07-29 DIAGNOSIS — N898 Other specified noninflammatory disorders of vagina: Secondary | ICD-10-CM | POA: Diagnosis not present

## 2020-07-29 NOTE — Progress Notes (Signed)
Pt is here as new patient to establish care and annual exam.   G3P3 vaginal deliveries  Last Pap: 12/05/2014, ASCUS + HR HPV  LMP: 07/24/2020  Currently SA, Patient does not use contraception and does not desire any at this time.

## 2020-07-29 NOTE — Progress Notes (Addendum)
Subjective:        [name] [name] is a 29 y.o. female here for a routine exam.  Current complaints: Vaginal discharge and vaginal pain.    Personal health questionnaire:  Is patient Ashkenazi Jewish, have a family history of breast and/or ovarian cancer: no Is there a family history of uterine cancer diagnosed at age < 38, gastrointestinal cancer, urinary tract cancer, family member who is a Personnel officer syndrome-associated carrier: no Is the patient overweight and hypertensive, family history of diabetes, personal history of gestational diabetes, preeclampsia or PCOS: yes Is patient over 29, have PCOS,  family history of premature CHD under age 51, diabetes, smoke, have hypertension or peripheral artery disease:  no At any time, has a partner hit, kicked or otherwise hurt or frightened you?: no Over the past 2 weeks, have you felt down, depressed or hopeless?: no Over the past 2 weeks, have you felt little interest or pleasure in doing things?:no   Gynecologic History Patient's last menstrual period was 07/24/2020 (approximate). Contraception: none Last Pap: 2016. Results were: ASCUS with positive HRHPV Last mammogram: n/a. Results were: n/a  Obstetric History OB History  Gravida Para Term Preterm AB Living  3 3 3  0 0 3  SAB TAB Ectopic Multiple Live Births  0 0 0 0 3    # Outcome Date GA Lbr Len/2nd Weight Sex Delivery Anes PTL Lv  3 Term 01/11/15 [redacted]w[redacted]d 05:20 / 00:25 6 lb 11.6 oz (3.05 kg) M Vag-Spont EPI  LIV  2 Term 01/30/14 106w5d 10:00 / 00:03 7 lb 3.8 oz (3.283 kg) F Vag-Spont EPI  LIV  1 Term 08/22/12 [redacted]w[redacted]d 08:03 / 00:08 6 lb 11.9 oz (3.06 kg) M Vag-Spont EPI  LIV    Past Medical History:  Diagnosis Date  . Acid reflux   . Anxiety    hx of anxiety attack  . Gonorrhea     Past Surgical History:  Procedure Laterality Date  . NO PAST SURGERIES    . WISDOM TOOTH EXTRACTION       Current Outpatient Medications:  .  ibuprofen (ADVIL) 600 MG tablet, Take 1 tablet (600 mg  total) by mouth every 6 (six) hours as needed., Disp: 30 tablet, Rfl: 0 .  omeprazole (PRILOSEC) 20 MG capsule, Take 20 mg by mouth 2 (two) times daily., Disp: , Rfl:  .  carbamide peroxide (DEBROX) 6.5 % OTIC solution, Place 5 drops into the right ear 2 (two) times daily. (Patient not taking: Reported on 07/29/2020), Disp: 15 mL, Rfl: 0 .  cetirizine (ZYRTEC ALLERGY) 10 MG tablet, Take 1 tablet (10 mg total) by mouth daily for 14 days., Disp: 14 tablet, Rfl: 0 .  fluticasone (FLONASE) 50 MCG/ACT nasal spray, Place 1 spray into both nostrils daily. (Patient not taking: Reported on 07/29/2020), Disp: 11.1 mL, Rfl: 0 .  methocarbamol (ROBAXIN) 500 MG tablet, Take 1 tablet (500 mg total) by mouth every 8 (eight) hours as needed for muscle spasms. (Patient not taking: Reported on 07/29/2020), Disp: 8 tablet, Rfl: 0 .  metroNIDAZOLE (FLAGYL) 500 MG tablet, Take 1 tablet (500 mg total) by mouth 2 (two) times daily. (Patient not taking: Reported on 01/22/2020), Disp: 14 tablet, Rfl: 0 .  naproxen (NAPROSYN) 500 MG tablet, Take 1 tablet (500 mg total) by mouth 2 (two) times daily as needed. (Patient not taking: Reported on 07/29/2020), Disp: 30 tablet, Rfl: 0 No Known Allergies  Social History   Tobacco Use  . Smoking status: Never Smoker  .  Smokeless tobacco: Never Used  Substance Use Topics  . Alcohol use: No    Family History  Problem Relation Age of Onset  . Asthma Sister   . Hypertension Brother   . Asthma Brother   . Diabetes Maternal Grandmother   . Diabetes Maternal Grandfather   . Other Neg Hx       Review of Systems  Constitutional: negative for fatigue and weight loss Respiratory: negative for cough and wheezing Cardiovascular: negative for chest pain, fatigue and palpitations Gastrointestinal: negative for abdominal pain and change in bowel habits Musculoskeletal:negative for myalgias Neurological: negative for gait problems and tremors Behavioral/Psych: negative for abusive  relationship, depression Endocrine: negative for temperature intolerance    Genitourinary:negative for abnormal menstrual periods, genital lesions, hot flashes, sexual problems.  Positive for vaginal discharge Integument/breast: negative for breast lump, breast tenderness, nipple discharge and skin lesion(s)    Objective:       BP 120/87   Pulse 65   Ht 5\' 5"  (1.651 m)   Wt 282 lb 1.6 oz (128 kg)   LMP 07/24/2020 (Approximate)   BMI 46.94 kg/m  General:   alert and no distress  Skin:   no rash or abnormalities  Lungs:   clear to auscultation bilaterally  Heart:   regular rate and rhythm, S1, S2 normal, no murmur, click, rub or gallop  Breasts:   normal without suspicious masses, skin or nipple changes or axillary nodes  Abdomen:  normal findings: no organomegaly, soft, non-tender and no hernia  Pelvis:  External genitalia: normal general appearance Urinary system: urethral meatus normal and bladder without fullness, nontender Vaginal: normal without tenderness, induration or masses Cervix: normal appearance Adnexa: normal bimanual exam Uterus: anteverted and non-tender, normal size   Lab Review Urine pregnancy test Labs reviewed yes Radiologic studies reviewed no  50% of 20 min visit spent on counseling and coordination of care.   Assessment:     1. Encounter for gynecological examination with Papanicolaou smear of cervix Rx: - Cytology - PAP( [facility])  2. Vaginal discharge Rx: - Cervicovaginal ancillary only  3. Encounter for other general counseling and advice on contraception - declines contraception  4. Screen for STD (sexually transmitted disease) Rx: - Hepatitis B surface antigen - Hepatitis C antibody - HIV Antibody (routine testing w rflx) - RPR    Plan:    Education reviewed: calcium supplements, depression evaluation, low fat, low cholesterol diet, safe sex/STD prevention, self breast exams and weight bearing exercise. Contraception:  none. Follow up in: 1 year.    Orders Placed This Encounter  Procedures  . Hepatitis B surface antigen  . Hepatitis C antibody  . HIV Antibody (routine testing w rflx)  . RPR    07/26/2020, MD 07/29/2020 10:34 AM

## 2020-07-30 LAB — HIV ANTIBODY (ROUTINE TESTING W REFLEX): HIV Screen 4th Generation wRfx: NONREACTIVE

## 2020-07-30 LAB — RPR: RPR Ser Ql: NONREACTIVE

## 2020-07-30 LAB — HEPATITIS C ANTIBODY: Hep C Virus Ab: <0.1 s/co ratio (ref 0.0–0.9)

## 2020-07-30 LAB — HEPATITIS B SURFACE ANTIGEN: Hepatitis B Surface Ag: NEGATIVE

## 2020-07-31 DIAGNOSIS — F341 Dysthymic disorder: Secondary | ICD-10-CM | POA: Diagnosis not present

## 2020-07-31 LAB — CERVICOVAGINAL ANCILLARY ONLY
Bacterial Vaginitis (gardnerella): NEGATIVE
Candida Glabrata: NEGATIVE
Candida Vaginitis: NEGATIVE
Chlamydia: NEGATIVE
Comment: NEGATIVE
Comment: NEGATIVE
Comment: NEGATIVE
Comment: NEGATIVE
Comment: NEGATIVE
Comment: NORMAL
Neisseria Gonorrhea: NEGATIVE
Trichomonas: NEGATIVE

## 2020-08-04 LAB — CYTOLOGY - PAP: Diagnosis: HIGH — AB

## 2020-08-08 DIAGNOSIS — F341 Dysthymic disorder: Secondary | ICD-10-CM | POA: Diagnosis not present

## 2020-08-12 ENCOUNTER — Ambulatory Visit (INDEPENDENT_AMBULATORY_CARE_PROVIDER_SITE_OTHER): Payer: Medicaid Other | Admitting: Obstetrics

## 2020-08-12 ENCOUNTER — Other Ambulatory Visit: Payer: Self-pay

## 2020-08-12 ENCOUNTER — Encounter: Payer: Self-pay | Admitting: Obstetrics

## 2020-08-12 ENCOUNTER — Other Ambulatory Visit (HOSPITAL_COMMUNITY)
Admission: RE | Admit: 2020-08-12 | Discharge: 2020-08-12 | Disposition: A | Discharge status: Home / Self Care | Payer: Medicaid Other | Source: Ambulatory Visit | Attending: Obstetrics | Admitting: Obstetrics

## 2020-08-12 VITALS — BP 105/71 | HR 71 | Wt 281.0 lb

## 2020-08-12 DIAGNOSIS — R87613 High grade squamous intraepithelial lesion on cytologic smear of cervix (HGSIL): Secondary | ICD-10-CM | POA: Insufficient documentation

## 2020-08-12 LAB — POCT URINE PREGNANCY: Preg Test, Ur: NEGATIVE

## 2020-08-12 NOTE — Progress Notes (Signed)
Past pap 07/29/20 - HSIL

## 2020-08-12 NOTE — Progress Notes (Signed)
Colposcopy Procedure Note  Indications: Pap smear 1 months ago showed: high-grade squamous intraepithelial neoplasia  (HGSIL-encompassing moderate and severe dysplasia). The prior pap showed no abnormalities.  Prior cervical/vaginal disease: normal exam without visible pathology. Prior cervical treatment: no treatment.  Procedure Details  The risks and benefits of the procedure and Written informed consent obtained.  A time-out was performed confirming the patient, procedure and allergy status  Speculum placed in vagina and excellent visualization of cervix achieved, cervix swabbed x 3 with acetic acid solution.  Findings: Cervix: acetowhite lesion(s) noted at 6 o'clock; cervical biopsies taken at 6 and 12 o'clock, specimen labelled and sent to pathology and hemostasis achieved with silver nitrate.   Vaginal inspection: normal without visible lesions. Vulvar colposcopy: vulvar colposcopy not performed.   Physical Exam   Specimens: ECC and Cervical Biopsies  Complications: none.  Plan: Specimens labelled and sent to Pathology. Will base further treatment on Pathology findings. Treatment options discussed with patient. Post biopsy instructions given to patient. Return to discuss Pathology results in 2 weeks   Brock Bad, MD 08/12/2020 10:06 AM.

## 2020-08-13 LAB — SURGICAL PATHOLOGY

## 2020-08-14 ENCOUNTER — Telehealth: Payer: Self-pay

## 2020-08-14 NOTE — Telephone Encounter (Signed)
Pt notified of colpo results and that she will need LEEP I explained procedure to pt and why procedure had to be done and advised pt refrain from any unprotected intercourse x 14 days and not be on cycle.  Pt voiced understanding awaiting call back for appt for LEEP.

## 2020-08-14 NOTE — Telephone Encounter (Signed)
-----   Message from Brock Bad, MD sent at 08/13/2020  9:08 AM EDT ----- HGSIL colposcopic biopsy.  Needs LEEP.

## 2020-08-15 DIAGNOSIS — F341 Dysthymic disorder: Secondary | ICD-10-CM | POA: Diagnosis not present

## 2020-08-22 DIAGNOSIS — F341 Dysthymic disorder: Secondary | ICD-10-CM | POA: Diagnosis not present

## 2020-08-25 DIAGNOSIS — Z419 Encounter for procedure for purposes other than remedying health state, unspecified: Secondary | ICD-10-CM | POA: Diagnosis not present

## 2020-08-26 ENCOUNTER — Telehealth (INDEPENDENT_AMBULATORY_CARE_PROVIDER_SITE_OTHER): Payer: Medicaid Other | Admitting: Obstetrics

## 2020-08-26 ENCOUNTER — Other Ambulatory Visit: Payer: Self-pay | Admitting: Obstetrics

## 2020-08-26 ENCOUNTER — Encounter: Payer: Self-pay | Admitting: Obstetrics

## 2020-08-26 ENCOUNTER — Other Ambulatory Visit: Payer: Self-pay

## 2020-08-26 DIAGNOSIS — N946 Dysmenorrhea, unspecified: Secondary | ICD-10-CM

## 2020-08-26 DIAGNOSIS — D069 Carcinoma in situ of cervix, unspecified: Secondary | ICD-10-CM

## 2020-08-26 MED ORDER — IBUPROFEN 800 MG PO TABS: 800.0000 mg | ORAL_TABLET | Freq: Three times a day (TID) | ORAL | 11 refills | Status: DC | PRN

## 2020-08-26 NOTE — Progress Notes (Signed)
GYN F\U

## 2020-08-26 NOTE — Progress Notes (Signed)
Patient ID: [name] [name], female   DOB: [birth year], 29 y.o.   MRN: [id number]  Chief Complaint  Patient presents with  . Follow-up    HPI [name] [name] is a 29 y.o. female.  Presents for results of colposcopy.  Has history of HGSIL pap smear. HPI  Past Medical History:  Diagnosis Date  . Acid reflux   . Anxiety    hx of anxiety attack  . Gonorrhea     Past Surgical History:  Procedure Laterality Date  . NO PAST SURGERIES    . WISDOM TOOTH EXTRACTION      Family History  Problem Relation Age of Onset  . Asthma Sister   . Hypertension Brother   . Asthma Brother   . Diabetes Maternal Grandmother   . Diabetes Maternal Grandfather   . Other Neg Hx     Social History Social History   Tobacco Use  . Smoking status: Never Smoker  . Smokeless tobacco: Never Used  Vaping Use  . Vaping Use: Never used  Substance Use Topics  . Alcohol use: No  . Drug use: No    No Known Allergies  Current Outpatient Medications  Medication Sig Dispense Refill  . carbamide peroxide (DEBROX) 6.5 % OTIC solution Place 5 drops into the right ear 2 (two) times daily. (Patient not taking: Reported on 07/29/2020) 15 mL 0  . cetirizine (ZYRTEC ALLERGY) 10 MG tablet Take 1 tablet (10 mg total) by mouth daily for 14 days. 14 tablet 0  . fluticasone (FLONASE) 50 MCG/ACT nasal spray Place 1 spray into both nostrils daily. (Patient not taking: Reported on 07/29/2020) 11.1 mL 0  . ibuprofen (ADVIL) 800 MG tablet Take 1 tablet (800 mg total) by mouth every 8 (eight) hours as needed. 30 tablet 11  . metroNIDAZOLE (FLAGYL) 500 MG tablet Take 1 tablet (500 mg total) by mouth 2 (two) times daily. (Patient not taking: Reported on 01/22/2020) 14 tablet 0  . omeprazole (PRILOSEC) 20 MG capsule Take 20 mg by mouth 2 (two) times daily.     No current facility-administered medications for this visit.    Review of Systems Review of Systems Constitutional: negative for fatigue and weight loss Respiratory: negative  for cough and wheezing Cardiovascular: negative for chest pain, fatigue and palpitations Gastrointestinal: negative for abdominal pain and change in bowel habits Genitourinary:negative Integument/breast: negative for nipple discharge Musculoskeletal:negative for myalgias Neurological: negative for gait problems and tremors Behavioral/Psych: negative for abusive relationship, depression Endocrine: negative for temperature intolerance      There were no vitals taken for this visit.  Physical Exam  Physical Exam:  Deferred  50% of 15 min visit spent on counseling and coordination of care.   Data Reviewed Pathology  Assessment     1. Severe dysplasia of cervix - LEEP scheduled - educational material on LEEP Procedure dispensed and all questions answered  2. Severe dysmenorrhea Rx: - ibuprofen (ADVIL) 800 MG tablet; Take 1 tablet (800 mg total) by mouth every 8 (eight) hours as needed.  Dispense: 30 tablet; Refill: 11  3. Class 3 severe obesity due to excess calories without serious comorbidity in adult, unspecified BMI (HCC) - program of caloric reduction, exercise and behavioral modification recommended    Plan    LEEP scheduled  Meds ordered this encounter  Medications  . ibuprofen (ADVIL) 800 MG tablet    Sig: Take 1 tablet (800 mg total) by mouth every 8 (eight) hours as needed.    Dispense:  30 tablet  Refill:  11     Brock Bad, MD 08/26/2020 4:21 PM

## 2020-08-26 NOTE — Patient Instructions (Addendum)
Loop Electrosurgical Excision Procedure Loop electrosurgical excision procedure (LEEP) is the cutting and removal (excision) of tissue from the cervix. The cervix is the bottom part of the uterus that opens into the vagina. The tissue that is removed from the cervix is examined to see if there are precancerous cells or cancer cells present. LEEP may be done when:  You have abnormal bleeding from your cervix.  You have an abnormal Pap test result.  Your health care provider finds an abnormality on your cervix during a pelvic exam. LEEP typically only takes a few minutes and is often done in the health care provider's office. The procedure is safe for women who are trying to get pregnant. However, the procedure is usually not done during a menstrual period or during pregnancy. Tell a health care provider about:  Any allergies you have.  All medicines you are taking, including vitamins, herbs, eye drops, creams, and over-the-counter medicines.  Any blood disorders you have.  Any medical conditions you have, including current or past vaginal infections such as herpes or sexually-transmitted infections (STIs).  Whether you are pregnant or may be pregnant.  Whether or not you are having vaginal bleeding on the day of the procedure. What are the risks? Generally, this is a safe procedure. However, problems may occur, including:  Infection.  Bleeding.  Allergic reactions to medicines.  Changes or scarring in the cervix.  Increased risk of early (preterm) labor in future pregnancies. What happens before the procedure?  Ask your health care provider about: ? Changing or stopping your regular medicines. This is especially important if you are taking diabetes medicines or blood thinners. ? Taking medicines such as aspirin and ibuprofen. These medicines can thin your blood. Do not take these medicines unless your health care provider tells you to take them. ? Taking over-the-counter  medicines, vitamins, herbs, and supplements.  Your health care provider may recommend that you take pain medicine before the procedure.  Ask your health care provider if you should plan to have someone take you home after the procedure. What happens during the procedure?   An instrument called a speculum will be placed in your vagina. This will allow your health care provider to see your cervix.  You will be given a medicine to numb the area (local anesthetic). The medicine will be injected into your cervix and the surrounding area.  A solution will be applied to your cervix. This solution will help the health care provider find the abnormal cells that need to be removed.  A thin wire loop will be passed through your vagina. The wire will be used to burn (cauterize) the cervical tissue with an electrical current.  You may feel faint during the procedure. Tell your health care provider right away if you feel this way.  The abnormal cervical tissue will be removed.  Any open blood vessels will be cauterized to prevent bleeding.  A paste may be applied to the cauterized area of your cervix to help prevent bleeding.  The sample of cervical tissue will be examined under a microscope. The procedure may vary among health care providers and hospitals. What can I expect after the procedure? After the procedure, it is common to have:  Mild abdominal cramps that are similar to menstrual cramps. These may last for up to 1 week.  A small amount of pink-tinged or bloody vaginal discharge, including light to moderate bleeding, for 1-2 weeks.  A dark-colored discharge coming from your vagina. This is from   the paste that was used on the cervix to prevent bleeding. It is up to you to get the results of your procedure. Ask your health care provider, or the department that is doing the procedure, when your results will be ready. Follow these instructions at home:  Take over-the-counter and  prescription medicines only as told by your health care provider.  Return to your normal activities as told by your health care provider. Ask your health care provider what activities are safe for you.  Do not put anything in your vagina for 2 weeks after the procedure or until your health care provider says that it is okay. This includes tampons, creams, and douches.  Do not have sex until your health care provider approves.  Keep all follow-up visits as told by your health care provider. This is important. Contact a health care provider if you:  Have a fever or chills.  Feel unusually weak.  Have vaginal bleeding that is heavier or longer than a normal menstrual cycle. A sign of this can be soaking a pad with blood or bleeding with clots.  Develop a bad smelling vaginal discharge.  Have severe abdominal pain or cramping. Summary  Loop electrosurgical excision procedure (LEEP) is the removal of tissue from the cervix. The removed tissue will be checked for precancerous cells or cancer cells.  LEEP typically only takes a few minutes and is often done in the health care provider's office.  Do not put anything in your vagina for 2 weeks after the procedure or until your health care provider says that it is okay. This includes tampons, creams, and douches.  Keep all follow-up visits as told by your health care provider. Ask your health care provider, or the department that is doing the procedure, when your results will be ready. This information is not intended to replace advice given to you by your health care provider. Make sure you discuss any questions you have with your health care provider. Document Revised: 11/03/2018 Document Reviewed: 11/03/2018 Elsevier Patient Education  2020 [employer]  Human Papillomavirus Human papillomavirus (HPV) is the most common sexually transmitted infection (STI). It spreads easily from person to person (ishighly contagious). There are many  types of HPV. It usually does not cause symptoms. However, sometimes it may cause wart-like lesions in the throat or warts in the genital area. It is possible to be infected for a long time and pass HPV to others without knowing it. Certain types of HPV may cause cancers, including cancer of the lower part of the uterus (cervix), vagina, outer female genital area (vulva), penis, anus, and rectum. HPV may also cause cancers of the oral cavity, such as the throat, tongue, and tonsils. What are the causes? HPV is caused by a virus that spreads from person to person through oral, vaginal, or anal sex. What increases the risk? You may be more likely to develop this condition if you have or have had:  Unprotected oral, vaginal, or anal sex.  Several sex partners.  A sex partner who has other sex partners.  Another STI.  A weak disease-fighting system (immune system).  Damaged skin in the genital, oral, or anal area. What are the signs or symptoms? Most people who have HPV do not have any symptoms. If symptoms are present, they may include:  Wart-like lesions in the throat (from having oral sex).  Warts on the infected skin or mucous membranes.  Genital warts that may itch, burn, bleed, or be painful during  sex. How is this diagnosed? If you have wart-like lumps in the anal area or throat, or if genital warts are present, your health care provider can usually diagnose HPV with a physical exam. Genital warts are easily seen. In females, tests may be used to diagnose HPV, including:  A Pap test. A Pap test takes a sample of cells from the cervix to check for cancer and HPV infection.  An HPV test. This is similar to a Pap test and involves taking a sample of cells from the cervix.  Using a scope to view the cervix (colposcopy). This may be done if a pelvic exam or Pap test is abnormal. A sample of tissue may be removed for testing (biopsy) during the colposcopy. Currently, there is no test  to detect HPV in males. How is this treated? There is no treatment for the virus itself. However, there are treatments for the health problems and symptoms HPV can cause. Treatment for HPV may include:  Medicines in a cream, lotion, liquid, or gel form. These medicines may be injected into or applied to genital or anal warts.  Use of a probe to apply extreme cold (cryotherapy) to the genital or anal warts.  Application of an intense beam of light (laser treatment) on the genital or anal warts.  Use of a probe to apply extreme heat (electrocautery) on the genital or anal warts.  Surgery to remove the genital or anal warts. Your health care provider will monitor you closely after you are treated. HPV can come back and you may need treatment again. Follow these instructions at home: Medicines  Take over-the-counter and prescription medicines only as told by your health care provider. This include creams for itching or irritation.  Do not treat genital or anal warts with medicines used for treating hand warts. General instructions  Do not touch or scratch the warts.  Do not have sex while you are being treated.  Do not douche or use tampons during treatment (for women).  Tell your sex partner about your infection. He or she may also need to be treated.  If you become pregnant, tell your health care provider that you have HPV. Your health care provider will monitor you closely during pregnancy to make sure you and your baby are safe.  Keep all follow-up visits as told by your health care provider. This is important. How is this prevented?  Talk with your health care provider about getting an HPV vaccine, which can prevent some HPV infections and related cancers. It will not work if you already have HPV and is not recommended for pregnant women. You may need 2-3 doses of the vaccine, depending on your age.  After treatment, use condoms during sex to prevent future infections.  Have  only one sex partner.  Have a sex partner who does not have other sex partners.  Get regular Pap tests as directed by your health care provider. Contact a health care provider if:  The treated skin becomes red, swollen, or painful.  You have a fever.  You feel generally ill.  You feel lumps or pimples in and around your genital or anal area.  You develop bleeding of the vagina or the treatment area.  You have painful sex. Summary  Human papillomavirus (HPV) is the most common sexually transmitted infection (STI) and is highly contagious.  Most people carrying HPV do not have any symptoms.  Many forms of HPV can be prevented with vaccination.  There is no treatment  for the virus itself. However, there are treatments for the health problems and symptoms HPV can cause. This information is not intended to replace advice given to you by your health care provider. Make sure you discuss any questions you have with your health care provider. Document Revised: 06/19/2019 Document Reviewed: 06/08/2018 Elsevier Patient Education  2020 [employer]  Dysmenorrhea  Dysmenorrhea refers to cramps caused by the muscles of the uterus tightening (contracting) during a menstrual period. Dysmenorrhea may be mild, or it may be severe enough to interfere with everyday activities for a few days each month. Primary dysmenorrhea is menstrual cramps that last a couple of days when you start having menstrual periods or soon after. This often begins after a teenager starts having her period. As a woman gets older or has a baby, the cramps will usually lessen or disappear. Secondary dysmenorrhea begins later in life and is caused by a disorder in the reproductive system. It lasts longer, and it may cause more pain than primary dysmenorrhea. The pain may start before the period and last a few days after the period. What are the causes? Dysmenorrhea is usually caused by an underlying problem, such as:  The  tissue that lines the uterus (endometrium) growing outside of the uterus in other areas of the body (endometriosis).  Endometrial tissue growing into the muscular walls of the uterus (adenomyosis).  Blood vessels in the pelvis becoming filled with blood just before the menstrual period (pelvic congestive syndrome).  Overgrowth of cells (polyps) in the endometrium or the lower part of the uterus (cervix).  The uterus dropping down into the vagina (prolapse) due to stretched or weak muscles.  Bladder problems, such as infection or inflammation.  Intestinal problems, such as a tumor or irritable bowel syndrome.  Cancer of the reproductive organs or bladder.  A severely tipped uterus.  A cervix that is closed or has a very small opening.  Noncancerous (benign) tumors of the uterus (fibroids).  Pelvic inflammatory disease (PID).  Pelvic scarring (adhesions) from a previous surgery.  An ovarian cyst.  An IUD (intrauterine device). What increases the risk? You are more likely to develop this condition if:  You are younger than age 75.  You started puberty early.  You have irregular or heavy bleeding.  You have never given birth.  You have a family history of dysmenorrhea.  You smoke. What are the signs or symptoms? Symptoms of this condition include:  Cramping, throbbing pain, or a feeling of fullness in the lower abdomen.  Lower back pain.  Periods lasting for longer than 7 days.  Headaches.  Bloating.  Fatigue.  Nausea or vomiting.  Diarrhea.  Sweating or dizziness.  Loose stools. How is this diagnosed? This condition may be diagnosed based on:  Your symptoms.  Your medical history.  A physical exam.  Blood tests.  A Pap test. This is a test in which cells from the cervix are tested for signs of cancer or infection.  A pregnancy test.  Imaging tests, such as: ? Ultrasound. ? A procedure to remove and examine a sample of endometrial tissue  (dilation and curettage, D&C). ? A procedure to visually examine the inside of:  The uterus (hysteroscopy).  The abdomen or pelvis (laparoscopy).  The bladder (cystoscopy).  The intestine (colonoscopy).  The stomach (gastroscopy). ? X-rays. ? CT scan. ? MRI. How is this treated? Treatment depends on the cause of the dysmenorrhea. Treatment may include:  Pain medicine prescribed by your health care provider.  Birth control pills that contain the hormone progesterone.  An IUD that contains the hormone progesterone.  Medicines to control bleeding.  Hormone replacement therapy.  NSAIDs. These may help to stop the production of hormones that cause cramps.  Antidepressant medicines.  Surgery to remove adhesions, endometriosis, ovarian cysts, fibroids, or the entire uterus (hysterectomy).  Injections of progesterone to stop the menstrual period.  A procedure to destroy the endometrium (endometrial ablation).  A procedure to cut the nerves in the bottom of the spine (sacrum) that go to the reproductive organs (presacral neurectomy).  A procedure to apply an electric current to nerves in the sacrum (sacral nerve stimulation).  Exercise and physical therapy.  Meditation and yoga therapy.  Acupuncture. Work with your health care provider to determine what treatment or combination of treatments is best for you. Follow these instructions at home: Relieving pain and cramping  Apply heat to your lower back or abdomen when you experience pain or cramps. Use the heat source that your health care provider recommends, such as a moist heat pack or a heating pad. ? Place a towel between your skin and the heat source. ? Leave the heat on for 20-30 minutes. ? Remove the heat if your skin turns bright red. This is especially important if you are unable to feel pain, heat, or cold. You may have a greater risk of getting burned. ? Do not sleep with a heating pad on.  Do aerobic  exercises, such as walking, swimming, or biking. This can help to relieve cramps.  Massage your lower back or abdomen to help relieve pain. General instructions  Take over-the-counter and prescription medicines only as told by your health care provider.  Do not drive or use heavy machinery while taking prescription pain medicine.  Avoid alcohol and caffeine during and right before your menstrual period. These can make cramps worse.  Do not use any products that contain nicotine or tobacco, such as cigarettes and e-cigarettes. If you need help quitting, ask your health care provider.  Keep all follow-up visits as told by your health care provider. This is important. Contact a health care provider if:  You have pain that gets worse or does not get better with medicine.  You have pain with sex.  You develop nausea or vomiting with your period that is not controlled with medicine. Get help right away if:  You faint. Summary  Dysmenorrhea refers to cramps caused by the muscles of the uterus tightening (contracting) during a menstrual period.  Dysmenorrhea may be mild, or it may be severe enough to interfere with everyday activities for a few days each month.  Treatment depends on the cause of the dysmenorrhea.  Work with your health care provider to determine what treatment or combination of treatments is best for you. This information is not intended to replace advice given to you by your health care provider. Make sure you discuss any questions you have with your health care provider. Document Revised: 09/23/2017 Document Reviewed: 11/13/2016 Elsevier Patient Education  2020 [employer]  Dysmenorrhea  Dysmenorrhea refers to cramps caused by the muscles of the uterus tightening (contracting) during a menstrual period. Dysmenorrhea may be mild, or it may be severe enough to interfere with everyday activities for a few days each month. Primary dysmenorrhea is menstrual cramps  that last a couple of days when you start having menstrual periods or soon after. This often begins after a teenager starts having her period. As a woman gets older or  has a baby, the cramps will usually lessen or disappear. Secondary dysmenorrhea begins later in life and is caused by a disorder in the reproductive system. It lasts longer, and it may cause more pain than primary dysmenorrhea. The pain may start before the period and last a few days after the period. What are the causes? Dysmenorrhea is usually caused by an underlying problem, such as:  The tissue that lines the uterus (endometrium) growing outside of the uterus in other areas of the body (endometriosis).  Endometrial tissue growing into the muscular walls of the uterus (adenomyosis).  Blood vessels in the pelvis becoming filled with blood just before the menstrual period (pelvic congestive syndrome).  Overgrowth of cells (polyps) in the endometrium or the lower part of the uterus (cervix).  The uterus dropping down into the vagina (prolapse) due to stretched or weak muscles.  Bladder problems, such as infection or inflammation.  Intestinal problems, such as a tumor or irritable bowel syndrome.  Cancer of the reproductive organs or bladder.  A severely tipped uterus.  A cervix that is closed or has a very small opening.  Noncancerous (benign) tumors of the uterus (fibroids).  Pelvic inflammatory disease (PID).  Pelvic scarring (adhesions) from a previous surgery.  An ovarian cyst.  An IUD (intrauterine device). What increases the risk? You are more likely to develop this condition if:  You are younger than age 75.  You started puberty early.  You have irregular or heavy bleeding.  You have never given birth.  You have a family history of dysmenorrhea.  You smoke. What are the signs or symptoms? Symptoms of this condition include:  Cramping, throbbing pain, or a feeling of fullness in the lower  abdomen.  Lower back pain.  Periods lasting for longer than 7 days.  Headaches.  Bloating.  Fatigue.  Nausea or vomiting.  Diarrhea.  Sweating or dizziness.  Loose stools. How is this diagnosed? This condition may be diagnosed based on:  Your symptoms.  Your medical history.  A physical exam.  Blood tests.  A Pap test. This is a test in which cells from the cervix are tested for signs of cancer or infection.  A pregnancy test.  Imaging tests, such as: ? Ultrasound. ? A procedure to remove and examine a sample of endometrial tissue (dilation and curettage, D&C). ? A procedure to visually examine the inside of:  The uterus (hysteroscopy).  The abdomen or pelvis (laparoscopy).  The bladder (cystoscopy).  The intestine (colonoscopy).  The stomach (gastroscopy). ? X-rays. ? CT scan. ? MRI. How is this treated? Treatment depends on the cause of the dysmenorrhea. Treatment may include:  Pain medicine prescribed by your health care provider.  Birth control pills that contain the hormone progesterone.  An IUD that contains the hormone progesterone.  Medicines to control bleeding.  Hormone replacement therapy.  NSAIDs. These may help to stop the production of hormones that cause cramps.  Antidepressant medicines.  Surgery to remove adhesions, endometriosis, ovarian cysts, fibroids, or the entire uterus (hysterectomy).  Injections of progesterone to stop the menstrual period.  A procedure to destroy the endometrium (endometrial ablation).  A procedure to cut the nerves in the bottom of the spine (sacrum) that go to the reproductive organs (presacral neurectomy).  A procedure to apply an electric current to nerves in the sacrum (sacral nerve stimulation).  Exercise and physical therapy.  Meditation and yoga therapy.  Acupuncture. Work with your health care provider to determine what treatment or combination  of treatments is best for you. Follow  these instructions at home: Relieving pain and cramping  Apply heat to your lower back or abdomen when you experience pain or cramps. Use the heat source that your health care provider recommends, such as a moist heat pack or a heating pad. ? Place a towel between your skin and the heat source. ? Leave the heat on for 20-30 minutes. ? Remove the heat if your skin turns bright red. This is especially important if you are unable to feel pain, heat, or cold. You may have a greater risk of getting burned. ? Do not sleep with a heating pad on.  Do aerobic exercises, such as walking, swimming, or biking. This can help to relieve cramps.  Massage your lower back or abdomen to help relieve pain. General instructions  Take over-the-counter and prescription medicines only as told by your health care provider.  Do not drive or use heavy machinery while taking prescription pain medicine.  Avoid alcohol and caffeine during and right before your menstrual period. These can make cramps worse.  Do not use any products that contain nicotine or tobacco, such as cigarettes and e-cigarettes. If you need help quitting, ask your health care provider.  Keep all follow-up visits as told by your health care provider. This is important. Contact a health care provider if:  You have pain that gets worse or does not get better with medicine.  You have pain with sex.  You develop nausea or vomiting with your period that is not controlled with medicine. Get help right away if:  You faint. Summary  Dysmenorrhea refers to cramps caused by the muscles of the uterus tightening (contracting) during a menstrual period.  Dysmenorrhea may be mild, or it may be severe enough to interfere with everyday activities for a few days each month.  Treatment depends on the cause of the dysmenorrhea.  Work with your health care provider to determine what treatment or combination of treatments is best for you. This  information is not intended to replace advice given to you by your health care provider. Make sure you discuss any questions you have with your health care provider. Document Revised: 09/23/2017 Document Reviewed: 11/13/2016 Else[employer] Patient Education  2020 [employer]  Dysmenorrhea  Dysmenorrhea refers to cramps caused by the muscles of the uterus tightening (contracting) during a menstrual period. Dysmenorrhea may be mild, or it may be severe enough to interfere with everyday activities for a few days each month. Primary dysmenorrhea is menstrual cramps that last a couple of days when you start having menstrual periods or soon after. This often begins after a teenager starts having her period. As a woman gets older or has a baby, the cramps will usually lessen or disappear. Secondary dysmenorrhea begins later in life and is caused by a disorder in the reproductive system. It lasts longer, and it may cause more pain than primary dysmenorrhea. The pain may start before the period and last a few days after the period. What are the causes? Dysmenorrhea is usually caused by an underlying problem, such as:  The tissue that lines the uterus (endometrium) growing outside of the uterus in other areas of the body (endometriosis).  Endometrial tissue growing into the muscular walls of the uterus (adenomyosis).  Blood vessels in the pelvis becoming filled with blood just before the menstrual period (pelvic congestive syndrome).  Overgrowth of cells (polyps) in the endometrium or the lower part of the uterus (cervix).  The uterus  dropping down into the vagina (prolapse) due to stretched or weak muscles.  Bladder problems, such as infection or inflammation.  Intestinal problems, such as a tumor or irritable bowel syndrome.  Cancer of the reproductive organs or bladder.  A severely tipped uterus.  A cervix that is closed or has a very small opening.  Noncancerous (benign) tumors of the uterus  (fibroids).  Pelvic inflammatory disease (PID).  Pelvic scarring (adhesions) from a previous surgery.  An ovarian cyst.  An IUD (intrauterine device). What increases the risk? You are more likely to develop this condition if:  You are younger than age 87.  You started puberty early.  You have irregular or heavy bleeding.  You have never given birth.  You have a family history of dysmenorrhea.  You smoke. What are the signs or symptoms? Symptoms of this condition include:  Cramping, throbbing pain, or a feeling of fullness in the lower abdomen.  Lower back pain.  Periods lasting for longer than 7 days.  Headaches.  Bloating.  Fatigue.  Nausea or vomiting.  Diarrhea.  Sweating or dizziness.  Loose stools. How is this diagnosed? This condition may be diagnosed based on:  Your symptoms.  Your medical history.  A physical exam.  Blood tests.  A Pap test. This is a test in which cells from the cervix are tested for signs of cancer or infection.  A pregnancy test.  Imaging tests, such as: ? Ultrasound. ? A procedure to remove and examine a sample of endometrial tissue (dilation and curettage, D&C). ? A procedure to visually examine the inside of:  The uterus (hysteroscopy).  The abdomen or pelvis (laparoscopy).  The bladder (cystoscopy).  The intestine (colonoscopy).  The stomach (gastroscopy). ? X-rays. ? CT scan. ? MRI. How is this treated? Treatment depends on the cause of the dysmenorrhea. Treatment may include:  Pain medicine prescribed by your health care provider.  Birth control pills that contain the hormone progesterone.  An IUD that contains the hormone progesterone.  Medicines to control bleeding.  Hormone replacement therapy.  NSAIDs. These may help to stop the production of hormones that cause cramps.  Antidepressant medicines.  Surgery to remove adhesions, endometriosis, ovarian cysts, fibroids, or the entire uterus  (hysterectomy).  Injections of progesterone to stop the menstrual period.  A procedure to destroy the endometrium (endometrial ablation).  A procedure to cut the nerves in the bottom of the spine (sacrum) that go to the reproductive organs (presacral neurectomy).  A procedure to apply an electric current to nerves in the sacrum (sacral nerve stimulation).  Exercise and physical therapy.  Meditation and yoga therapy.  Acupuncture. Work with your health care provider to determine what treatment or combination of treatments is best for you. Follow these instructions at home: Relieving pain and cramping  Apply heat to your lower back or abdomen when you experience pain or cramps. Use the heat source that your health care provider recommends, such as a moist heat pack or a heating pad. ? Place a towel between your skin and the heat source. ? Leave the heat on for 20-30 minutes. ? Remove the heat if your skin turns bright red. This is especially important if you are unable to feel pain, heat, or cold. You may have a greater risk of getting burned. ? Do not sleep with a heating pad on.  Do aerobic exercises, such as walking, swimming, or biking. This can help to relieve cramps.  Massage your lower back or abdomen to  help relieve pain. General instructions  Take over-the-counter and prescription medicines only as told by your health care provider.  Do not drive or use heavy machinery while taking prescription pain medicine.  Avoid alcohol and caffeine during and right before your menstrual period. These can make cramps worse.  Do not use any products that contain nicotine or tobacco, such as cigarettes and e-cigarettes. If you need help quitting, ask your health care provider.  Keep all follow-up visits as told by your health care provider. This is important. Contact a health care provider if:  You have pain that gets worse or does not get better with medicine.  You have pain with  sex.  You develop nausea or vomiting with your period that is not controlled with medicine. Get help right away if:  You faint. Summary  Dysmenorrhea refers to cramps caused by the muscles of the uterus tightening (contracting) during a menstrual period.  Dysmenorrhea may be mild, or it may be severe enough to interfere with everyday activities for a few days each month.  Treatment depends on the cause of the dysmenorrhea.  Work with your health care provider to determine what treatment or combination of treatments is best for you. This information is not intended to replace advice given to you by your health care provider. Make sure you discuss any questions you have with your health care provider. Document Revised: 09/23/2017 Document Reviewed: 11/13/2016 Elsevier Patient Education  2020 [employer]  Dysmenorrhea  Dysmenorrhea refers to cramps caused by the muscles of the uterus tightening (contracting) during a menstrual period. Dysmenorrhea may be mild, or it may be severe enough to interfere with everyday activities for a few days each month. Primary dysmenorrhea is menstrual cramps that last a couple of days when you start having menstrual periods or soon after. This often begins after a teenager starts having her period. As a woman gets older or has a baby, the cramps will usually lessen or disappear. Secondary dysmenorrhea begins later in life and is caused by a disorder in the reproductive system. It lasts longer, and it may cause more pain than primary dysmenorrhea. The pain may start before the period and last a few days after the period. What are the causes? Dysmenorrhea is usually caused by an underlying problem, such as:  The tissue that lines the uterus (endometrium) growing outside of the uterus in other areas of the body (endometriosis).  Endometrial tissue growing into the muscular walls of the uterus (adenomyosis).  Blood vessels in the pelvis becoming filled with  blood just before the menstrual period (pelvic congestive syndrome).  Overgrowth of cells (polyps) in the endometrium or the lower part of the uterus (cervix).  The uterus dropping down into the vagina (prolapse) due to stretched or weak muscles.  Bladder problems, such as infection or inflammation.  Intestinal problems, such as a tumor or irritable bowel syndrome.  Cancer of the reproductive organs or bladder.  A severely tipped uterus.  A cervix that is closed or has a very small opening.  Noncancerous (benign) tumors of the uterus (fibroids).  Pelvic inflammatory disease (PID).  Pelvic scarring (adhesions) from a previous surgery.  An ovarian cyst.  An IUD (intrauterine device). What increases the risk? You are more likely to develop this condition if:  You are younger than age 67.  You started puberty early.  You have irregular or heavy bleeding.  You have never given birth.  You have a family history of dysmenorrhea.  You smoke.  What are the signs or symptoms? Symptoms of this condition include:  Cramping, throbbing pain, or a feeling of fullness in the lower abdomen.  Lower back pain.  Periods lasting for longer than 7 days.  Headaches.  Bloating.  Fatigue.  Nausea or vomiting.  Diarrhea.  Sweating or dizziness.  Loose stools. How is this diagnosed? This condition may be diagnosed based on:  Your symptoms.  Your medical history.  A physical exam.  Blood tests.  A Pap test. This is a test in which cells from the cervix are tested for signs of cancer or infection.  A pregnancy test.  Imaging tests, such as: ? Ultrasound. ? A procedure to remove and examine a sample of endometrial tissue (dilation and curettage, D&C). ? A procedure to visually examine the inside of:  The uterus (hysteroscopy).  The abdomen or pelvis (laparoscopy).  The bladder (cystoscopy).  The intestine (colonoscopy).  The stomach  (gastroscopy). ? X-rays. ? CT scan. ? MRI. How is this treated? Treatment depends on the cause of the dysmenorrhea. Treatment may include:  Pain medicine prescribed by your health care provider.  Birth control pills that contain the hormone progesterone.  An IUD that contains the hormone progesterone.  Medicines to control bleeding.  Hormone replacement therapy.  NSAIDs. These may help to stop the production of hormones that cause cramps.  Antidepressant medicines.  Surgery to remove adhesions, endometriosis, ovarian cysts, fibroids, or the entire uterus (hysterectomy).  Injections of progesterone to stop the menstrual period.  A procedure to destroy the endometrium (endometrial ablation).  A procedure to cut the nerves in the bottom of the spine (sacrum) that go to the reproductive organs (presacral neurectomy).  A procedure to apply an electric current to nerves in the sacrum (sacral nerve stimulation).  Exercise and physical therapy.  Meditation and yoga therapy.  Acupuncture. Work with your health care provider to determine what treatment or combination of treatments is best for you. Follow these instructions at home: Relieving pain and cramping  Apply heat to your lower back or abdomen when you experience pain or cramps. Use the heat source that your health care provider recommends, such as a moist heat pack or a heating pad. ? Place a towel between your skin and the heat source. ? Leave the heat on for 20-30 minutes. ? Remove the heat if your skin turns bright red. This is especially important if you are unable to feel pain, heat, or cold. You may have a greater risk of getting burned. ? Do not sleep with a heating pad on.  Do aerobic exercises, such as walking, swimming, or biking. This can help to relieve cramps.  Massage your lower back or abdomen to help relieve pain. General instructions  Take over-the-counter and prescription medicines only as told by  your health care provider.  Do not drive or use heavy machinery while taking prescription pain medicine.  Avoid alcohol and caffeine during and right before your menstrual period. These can make cramps worse.  Do not use any products that contain nicotine or tobacco, such as cigarettes and e-cigarettes. If you need help quitting, ask your health care provider.  Keep all follow-up visits as told by your health care provider. This is important. Contact a health care provider if:  You have pain that gets worse or does not get better with medicine.  You have pain with sex.  You develop nausea or vomiting with your period that is not controlled with medicine. Get help right away  if:  You faint. Summary  Dysmenorrhea refers to cramps caused by the muscles of the uterus tightening (contracting) during a menstrual period.  Dysmenorrhea may be mild, or it may be severe enough to interfere with everyday activities for a few days each month.  Treatment depends on the cause of the dysmenorrhea.  Work with your health care provider to determine what treatment or combination of treatments is best for you. This information is not intended to replace advice given to you by your health care provider. Make sure you discuss any questions you have with your health care provider. Document Revised: 09/23/2017 Document Reviewed: 11/13/2016 Elsevier Patient Education  2020 [employer].

## 2020-08-29 DIAGNOSIS — F341 Dysthymic disorder: Secondary | ICD-10-CM | POA: Diagnosis not present

## 2020-09-04 ENCOUNTER — Other Ambulatory Visit (HOSPITAL_COMMUNITY)
Admission: RE | Admit: 2020-09-04 | Discharge: 2020-09-04 | Disposition: A | Discharge status: Home / Self Care | Payer: Medicaid Other | Source: Ambulatory Visit | Attending: Obstetrics & Gynecology | Admitting: Obstetrics & Gynecology

## 2020-09-04 ENCOUNTER — Other Ambulatory Visit: Payer: Self-pay

## 2020-09-04 ENCOUNTER — Ambulatory Visit (INDEPENDENT_AMBULATORY_CARE_PROVIDER_SITE_OTHER): Payer: Medicaid Other | Admitting: Obstetrics & Gynecology

## 2020-09-04 VITALS — BP 128/79 | HR 78 | Ht 65.0 in | Wt 281.2 lb

## 2020-09-04 DIAGNOSIS — R87613 High grade squamous intraepithelial lesion on cytologic smear of cervix (HGSIL): Secondary | ICD-10-CM | POA: Diagnosis not present

## 2020-09-04 DIAGNOSIS — Z9889 Other specified postprocedural states: Secondary | ICD-10-CM | POA: Diagnosis not present

## 2020-09-04 LAB — POCT URINE PREGNANCY: Preg Test, Ur: NEGATIVE

## 2020-09-04 NOTE — Patient Instructions (Signed)
Loop Electrosurgical Excision Procedure Loop electrosurgical excision procedure (LEEP) is the cutting and removal (excision) of tissue from the cervix. The cervix is the bottom part of the uterus that opens into the vagina. The tissue that is removed from the cervix is examined to see if there are precancerous cells or cancer cells present. LEEP may be done when:  You have abnormal bleeding from your cervix.  You have an abnormal Pap test result.  Your health care provider finds an abnormality on your cervix during a pelvic exam. LEEP typically only takes a few minutes and is often done in the health care provider's office. The procedure is safe for women who are trying to get pregnant. However, the procedure is usually not done during a menstrual period or during pregnancy. Tell a health care provider about:  Any allergies you have.  All medicines you are taking, including vitamins, herbs, eye drops, creams, and over-the-counter medicines.  Any blood disorders you have.  Any medical conditions you have, including current or past vaginal infections such as herpes or sexually-transmitted infections (STIs).  Whether you are pregnant or may be pregnant.  Whether or not you are having vaginal bleeding on the day of the procedure. What are the risks? Generally, this is a safe procedure. However, problems may occur, including:  Infection.  Bleeding.  Allergic reactions to medicines.  Changes or scarring in the cervix.  Increased risk of early (preterm) labor in future pregnancies. What happens before the procedure?  Ask your health care provider about: ? Changing or stopping your regular medicines. This is especially important if you are taking diabetes medicines or blood thinners. ? Taking medicines such as aspirin and ibuprofen. These medicines can thin your blood. Do not take these medicines unless your health care provider tells you to take them. ? Taking over-the-counter  medicines, vitamins, herbs, and supplements.  Your health care provider may recommend that you take pain medicine before the procedure.  Ask your health care provider if you should plan to have someone take you home after the procedure. What happens during the procedure?   An instrument called a speculum will be placed in your vagina. This will allow your health care provider to see your cervix.  You will be given a medicine to numb the area (local anesthetic). The medicine will be injected into your cervix and the surrounding area.  A solution will be applied to your cervix. This solution will help the health care provider find the abnormal cells that need to be removed.  A thin wire loop will be passed through your vagina. The wire will be used to burn (cauterize) the cervical tissue with an electrical current.  You may feel faint during the procedure. Tell your health care provider right away if you feel this way.  The abnormal cervical tissue will be removed.  Any open blood vessels will be cauterized to prevent bleeding.  A paste may be applied to the cauterized area of your cervix to help prevent bleeding.  The sample of cervical tissue will be examined under a microscope. The procedure may vary among health care providers and hospitals. What can I expect after the procedure? After the procedure, it is common to have:  Mild abdominal cramps that are similar to menstrual cramps. These may last for up to 1 week.  A small amount of pink-tinged or bloody vaginal discharge, including light to moderate bleeding, for 1-2 weeks.  A dark-colored discharge coming from your vagina. This is from   the paste that was used on the cervix to prevent bleeding. It is up to you to get the results of your procedure. Ask your health care provider, or the department that is doing the procedure, when your results will be ready. Follow these instructions at home:  Take over-the-counter and  prescription medicines only as told by your health care provider.  Return to your normal activities as told by your health care provider. Ask your health care provider what activities are safe for you.  Do not put anything in your vagina for 2 weeks after the procedure or until your health care provider says that it is okay. This includes tampons, creams, and douches.  Do not have sex until your health care provider approves.  Keep all follow-up visits as told by your health care provider. This is important. Contact a health care provider if you:  Have a fever or chills.  Feel unusually weak.  Have vaginal bleeding that is heavier or longer than a normal menstrual cycle. A sign of this can be soaking a pad with blood or bleeding with clots.  Develop a bad smelling vaginal discharge.  Have severe abdominal pain or cramping. Summary  Loop electrosurgical excision procedure (LEEP) is the removal of tissue from the cervix. The removed tissue will be checked for precancerous cells or cancer cells.  LEEP typically only takes a few minutes and is often done in the health care provider's office.  Do not put anything in your vagina for 2 weeks after the procedure or until your health care provider says that it is okay. This includes tampons, creams, and douches.  Keep all follow-up visits as told by your health care provider. Ask your health care provider, or the department that is doing the procedure, when your results will be ready. This information is not intended to replace advice given to you by your health care provider. Make sure you discuss any questions you have with your health care provider. Document Revised: 11/03/2018 Document Reviewed: 11/03/2018 Elsevier Patient Education  2020 [employer] LEEP POST-PROCEDURE INSTRUCTIONS  1. You may take Ibuprofen, Aleve or Tylenol for pain if needed.  Cramping is normal.  2. You will have black and/or bloody discharge at first.  This  will lighten and then turn clear before completely resolving.  This will take 2 to 3 weeks.  3. Put nothing in your vagina until the bleeding or discharge stops (usually 2 or3 days).  4. You need to call if you have redness around the biopsy site, if there is any unusual draining, if the bleeding is heavy, or if you are concerned.  5. Shower or bathe as normal  6. We will call you within one week with results or we will discuss the results at your follow-up appointment if needed.  7. You will need to return for a follow-up Pap smear as directed by your physician. 

## 2020-09-04 NOTE — Progress Notes (Signed)
Patient identified, informed consent obtained, signed copy in chart, time out performed.  Pap smear and colposcopy reviewed.  Done by Dr. 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  Cervix visualized. Lugols applied and small non-staining area at os noted Paracervical block placed.  Fischer small size loop used to remove cone of cervix using blend of cut and cautery on LEEP machine.  Edges/Base cauterized with Ball.  Monsel's solution used for hemostasis.  Patient tolerated procedure well.  Patient given post procedure instructions.  Follow up in 12 months for repeat pap or as needed.  Adam Phenix, MD 09/04/2020

## 2020-09-05 DIAGNOSIS — F341 Dysthymic disorder: Secondary | ICD-10-CM | POA: Diagnosis not present

## 2020-09-08 LAB — SURGICAL PATHOLOGY

## 2020-09-10 ENCOUNTER — Other Ambulatory Visit (HOSPITAL_COMMUNITY): Payer: Self-pay | Admitting: Internal Medicine

## 2020-09-10 DIAGNOSIS — Z6841 Body Mass Index (BMI) 40.0 and over, adult: Secondary | ICD-10-CM | POA: Diagnosis not present

## 2020-09-10 DIAGNOSIS — E7849 Other hyperlipidemia: Secondary | ICD-10-CM | POA: Diagnosis not present

## 2020-09-10 DIAGNOSIS — L259 Unspecified contact dermatitis, unspecified cause: Secondary | ICD-10-CM | POA: Diagnosis not present

## 2020-09-10 DIAGNOSIS — K219 Gastro-esophageal reflux disease without esophagitis: Secondary | ICD-10-CM | POA: Diagnosis not present

## 2020-09-10 DIAGNOSIS — R7303 Prediabetes: Secondary | ICD-10-CM | POA: Diagnosis not present

## 2020-09-10 NOTE — Progress Notes (Signed)
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

## 2020-09-12 DIAGNOSIS — F341 Dysthymic disorder: Secondary | ICD-10-CM | POA: Diagnosis not present

## 2020-09-16 ENCOUNTER — Encounter (HOSPITAL_COMMUNITY): Payer: Self-pay | Admitting: Emergency Medicine

## 2020-09-16 ENCOUNTER — Encounter (HOSPITAL_COMMUNITY): Payer: Self-pay

## 2020-09-16 ENCOUNTER — Other Ambulatory Visit: Payer: Self-pay

## 2020-09-16 ENCOUNTER — Ambulatory Visit (HOSPITAL_COMMUNITY)
Admission: EM | Admit: 2020-09-16 | Discharge: 2020-09-16 | Disposition: A | Discharge status: Home / Self Care | Payer: Medicaid Other | Attending: Emergency Medicine | Admitting: Emergency Medicine

## 2020-09-16 ENCOUNTER — Emergency Department (HOSPITAL_COMMUNITY)
Admission: EM | Admit: 2020-09-16 | Discharge: 2020-09-16 | Disposition: A | Discharge status: Home / Self Care | Payer: Medicaid Other | Attending: Emergency Medicine | Admitting: Emergency Medicine

## 2020-09-16 DIAGNOSIS — R1030 Lower abdominal pain, unspecified: Secondary | ICD-10-CM

## 2020-09-16 DIAGNOSIS — F341 Dysthymic disorder: Secondary | ICD-10-CM | POA: Diagnosis not present

## 2020-09-16 DIAGNOSIS — R52 Pain, unspecified: Secondary | ICD-10-CM | POA: Diagnosis not present

## 2020-09-16 DIAGNOSIS — R109 Unspecified abdominal pain: Secondary | ICD-10-CM | POA: Insufficient documentation

## 2020-09-16 DIAGNOSIS — Z5321 Procedure and treatment not carried out due to patient leaving prior to being seen by health care provider: Secondary | ICD-10-CM | POA: Insufficient documentation

## 2020-09-16 DIAGNOSIS — N390 Urinary tract infection, site not specified: Secondary | ICD-10-CM | POA: Diagnosis not present

## 2020-09-16 DIAGNOSIS — R8290 Unspecified abnormal findings in urine: Secondary | ICD-10-CM | POA: Insufficient documentation

## 2020-09-16 DIAGNOSIS — R319 Hematuria, unspecified: Secondary | ICD-10-CM | POA: Insufficient documentation

## 2020-09-16 DIAGNOSIS — R110 Nausea: Secondary | ICD-10-CM | POA: Diagnosis not present

## 2020-09-16 LAB — CBG MONITORING, ED: Glucose-Capillary: 76 mg/dL (ref 70–99)

## 2020-09-16 LAB — POCT URINALYSIS DIPSTICK, ED / UC
Glucose, UA: 500 mg/dL — AB
Ketones, ur: 40 mg/dL — AB
Nitrite: POSITIVE — AB
Protein, ur: >=300 mg/dL — AB
Specific Gravity, Urine: 1.02 (ref 1.005–1.030)
Urobilinogen, UA: 4 mg/dL — ABNORMAL HIGH (ref 0.0–1.0)
pH: 8.5 — ABNORMAL HIGH (ref 5.0–8.0)

## 2020-09-16 LAB — COMPREHENSIVE METABOLIC PANEL
ALT: 14 U/L (ref 0–44)
AST: 17 U/L (ref 15–41)
Albumin: 3.2 g/dL — ABNORMAL LOW (ref 3.5–5.0)
Alkaline Phosphatase: 90 U/L (ref 38–126)
Anion gap: 11 (ref 5–15)
BUN: 6 mg/dL (ref 6–20)
CO2: 23 mmol/L (ref 22–32)
Calcium: 9 mg/dL (ref 8.9–10.3)
Chloride: 105 mmol/L (ref 98–111)
Creatinine, Ser: 0.76 mg/dL (ref 0.44–1.00)
GFR, Estimated: >60 mL/min (ref >60)
Glucose, Bld: 103 mg/dL — ABNORMAL HIGH (ref 70–99)
Potassium: 3.8 mmol/L (ref 3.5–5.1)
Sodium: 139 mmol/L (ref 135–145)
Total Bilirubin: 0.4 mg/dL (ref 0.3–1.2)
Total Protein: 7 g/dL (ref 6.5–8.1)

## 2020-09-16 LAB — I-STAT BETA HCG BLOOD, ED (MC, WL, AP ONLY): I-stat hCG, quantitative: <5 m[IU]/mL (ref <5)

## 2020-09-16 LAB — CBC
HCT: 35.8 % — ABNORMAL LOW (ref 36.0–46.0)
Hemoglobin: 10.6 g/dL — ABNORMAL LOW (ref 12.0–15.0)
MCH: 24.9 pg — ABNORMAL LOW (ref 26.0–34.0)
MCHC: 29.6 g/dL — ABNORMAL LOW (ref 30.0–36.0)
MCV: 84.2 fL (ref 80.0–100.0)
Platelets: 483 10*3/uL — ABNORMAL HIGH (ref 150–400)
RBC: 4.25 MIL/uL (ref 3.87–5.11)
RDW: 15.2 % (ref 11.5–15.5)
WBC: 8.4 10*3/uL (ref 4.0–10.5)
nRBC: 0 % (ref 0.0–0.2)

## 2020-09-16 LAB — LIPASE, BLOOD: Lipase: 25 U/L (ref 11–51)

## 2020-09-16 MED ORDER — ONDANSETRON 4 MG PO TBDP
4.0000 mg | ORAL_TABLET | Freq: Once | ORAL | Status: AC | PRN
  Administered 2020-09-16: 4 mg via ORAL
  Filled 2020-09-16: qty 1

## 2020-09-16 MED ORDER — CEPHALEXIN 500 MG PO CAPS: 500.0000 mg | ORAL_CAPSULE | Freq: Two times a day (BID) | ORAL | 0 refills | Status: AC

## 2020-09-16 NOTE — ED Triage Notes (Signed)
Pt from home with ems for abd pain, pt currently on her menstrual cycle and took 800mg  ibuprofen without relief. No other symptoms.

## 2020-09-16 NOTE — ED Triage Notes (Signed)
Abdominal pain started around 12 noon. Patient reports vomiting, no diarrhea.  1 episode of vomiting.    Reports pain is in stomach and bilateral thighs.

## 2020-09-16 NOTE — ED Provider Notes (Signed)
[facility]    CSN: [id number] Arrival date & time: 09/16/20  1518      History   Chief Complaint Chief Complaint  Patient presents with  . Abdominal Pain    HPI [name] [name] is a 29 y.o. female.  Patient presents with lower abdominal pain since noon today.  She states she has her menses and always has lower abdominal cramps with this. She also reports pain in her left thigh which she also attributes to her menses. No falls or injury. She denies fever, chills, vomiting, diarrhea, pelvic pain, or other symptoms. She was transported to the ED via EMS at noon today but then left due to the wait time.  LMP: current.    The history is provided by the patient and medical records.    Past Medical History:  Diagnosis Date  . Acid reflux   . Anxiety    hx of anxiety attack  . Gonorrhea     Patient Active Problem List   Diagnosis Date Noted  . Learning disability 08/20/2013    Past Surgical History:  Procedure Laterality Date  . NO PAST SURGERIES    . WISDOM TOOTH EXTRACTION      OB History    Gravida  3   Para  3   Term  3   Preterm  0   AB  0   Living  3     SAB  0   TAB  0   Ectopic  0   Multiple  0   Live Births  3            Home Medications    Prior to Admission medications   Medication Sig Start Date End Date Taking? Authorizing Provider  ibuprofen (ADVIL) 800 MG tablet Take 1 tablet (800 mg total) by mouth every 8 (eight) hours as needed. 08/26/20  Yes Brock Bad, MD  omeprazole (PRILOSEC) 20 MG capsule Take 20 mg by mouth 2 (two) times daily. 06/17/20  Yes [provider]  carbamide peroxide (DEBROX) 6.5 % OTIC solution Place 5 drops into the right ear 2 (two) times daily. Patient not taking: Reported on 07/29/2020 01/22/20   Darr, Gerilyn Pilgrim, PA-C  cephALEXin (KEFLEX) 500 MG capsule Take 1 capsule (500 mg total) by mouth 2 (two) times daily for 5 days. 09/16/20 09/21/20  Mickie Bail, NP  cetirizine (ZYRTEC ALLERGY) 10  MG tablet Take 1 tablet (10 mg total) by mouth daily for 14 days. 01/22/20 02/05/20  Darr, Gerilyn Pilgrim, PA-C  fluticasone (FLONASE) 50 MCG/ACT nasal spray Place 1 spray into both nostrils daily. Patient not taking: Reported on 07/29/2020 01/22/20   Darr, Gerilyn Pilgrim, PA-C  metroNIDAZOLE (FLAGYL) 500 MG tablet Take 1 tablet (500 mg total) by mouth 2 (two) times daily. Patient not taking: Reported on 01/22/2020 11/15/18   Elvina Sidle, MD    Family History Family History  Problem Relation Age of Onset  . Asthma Sister   . Hypertension Brother   . Asthma Brother   . Diabetes Maternal Grandmother   . Diabetes Maternal Grandfather   . Other Neg Hx     Social History Social History   Tobacco Use  . Smoking status: Never Smoker  . Smokeless tobacco: Never Used  Vaping Use  . Vaping Use: Never used  Substance Use Topics  . Alcohol use: No  . Drug use: No     Allergies   Patient has no known allergies.   Review of Systems Review of  Systems  Constitutional: Negative for chills and fever.  HENT: Negative for ear pain and sore throat.   Eyes: Negative for pain and visual disturbance.  Respiratory: Negative for cough and shortness of breath.   Cardiovascular: Negative for chest pain and palpitations.  Gastrointestinal: Positive for abdominal pain. Negative for vomiting.  Genitourinary: Negative for dysuria and hematuria.  Musculoskeletal: Positive for myalgias. Negative for arthralgias and back pain.  Skin: Negative for color change and rash.  Neurological: Negative for seizures and syncope.  All other systems reviewed and are negative.    Physical Exam Triage Vital Signs ED Triage Vitals  Enc Vitals Group     BP 09/16/20 1555 134/64     Pulse Rate 09/16/20 1555 61     Resp 09/16/20 1555 (!) 22     Temp 09/16/20 1555 98.2 F (36.8 C)     Temp Source 09/16/20 1555 Oral     SpO2 09/16/20 1555 100 %     Weight --      Height --      Head Circumference --      Peak Flow --       Pain Score 09/16/20 1551 9     Pain Loc --      Pain Edu? --      Excl. in GC? --    No data found.  Updated Vital Signs BP 134/64 (BP Location: Right Arm)   Pulse 61   Temp 98.2 F (36.8 C) (Oral)   Resp (!) 22   LMP 08/19/2020 (Approximate)   SpO2 100%   Visual Acuity Right Eye Distance:   Left Eye Distance:   Bilateral Distance:    Right Eye Near:   Left Eye Near:    Bilateral Near:     Physical Exam Vitals and nursing note reviewed.  Constitutional:      General: She is not in acute distress.    Appearance: She is well-developed. She is obese. She is not ill-appearing.  HENT:     Head: Normocephalic and atraumatic.     Mouth/Throat:     Mouth: Mucous membranes are moist.  Eyes:     Conjunctiva/sclera: Conjunctivae normal.  Cardiovascular:     Rate and Rhythm: Normal rate and regular rhythm.     Heart sounds: Normal heart sounds.  Pulmonary:     Effort: Pulmonary effort is normal. No respiratory distress.     Breath sounds: Normal breath sounds.  Abdominal:     General: Bowel sounds are normal.     Palpations: Abdomen is soft.     Tenderness: There is no abdominal tenderness. There is no right CVA tenderness, left CVA tenderness, guarding or rebound.  Musculoskeletal:     Cervical back: Neck supple.  Skin:    General: Skin is warm and dry.     Findings: No rash.  Neurological:     General: No focal deficit present.     Mental Status: She is alert and oriented to person, place, and time.     Gait: Gait normal.  Psychiatric:        Mood and Affect: Mood normal.        Behavior: Behavior normal.      UC Treatments / Results  Labs (all labs ordered are listed, but only abnormal results are displayed) Labs Reviewed  POCT URINALYSIS DIPSTICK, ED / UC - Abnormal; Notable for the following components:      Result Value   Glucose, UA 500 (*)  Bilirubin Urine LARGE (*)    Ketones, ur 40 (*)    Hgb urine dipstick LARGE (*)    pH 8.5 (*)    Protein,  ur >=300 (*)    Urobilinogen, UA 4.0 (*)    Nitrite POSITIVE (*)    Leukocytes,Ua LARGE (*)    All other components within normal limits  URINE CULTURE  CBG MONITORING, ED    EKG   Radiology No results found.  Procedures Procedures (including critical care time)  Medications Ordered in UC Medications - No data to display  Initial Impression / Assessment and Plan / UC Course  I have reviewed the triage vital signs and the nursing notes.  Pertinent labs & imaging results that were available during my care of the patient were reviewed by me and considered in my medical decision making (see chart for details).   Lower abdominal pain.  UTI.  Abnormal urine result.  Urine culture pending.  Treating with Keflex.  Instructed patient to go back to the ED for evaluation of her acute abdominal pain if it continues.  Instructed her to follow-up with her PCP next week to have her urine rechecked.  Patient agrees to plan of care.   Final Clinical Impressions(s) / UC Diagnoses   Final diagnoses:  Lower abdominal pain  Urinary tract infection with hematuria, site unspecified  Abnormal urine     Discharge Instructions     Go to the Emergency Department for evaluation of your abdominal pain.   Take the antibiotic as directed.  The urine culture is pending.  We will call you if it shows the need to change or discontinue your antibiotic.   Have your urine rechecked by your primary care provider next week.           ED Prescriptions    Medication Sig Dispense Auth. Provider   cephALEXin (KEFLEX) 500 MG capsule Take 1 capsule (500 mg total) by mouth 2 (two) times daily for 5 days. 10 capsule Mickie Bail, NP     PDMP not reviewed this encounter.   Mickie Bail, NP 09/16/20 1654

## 2020-09-16 NOTE — Discharge Instructions (Addendum)
Go to the Emergency Department for evaluation of your abdominal pain.   Take the antibiotic as directed.  The urine culture is pending.  We will call you if it shows the need to change or discontinue your antibiotic.   Have your urine rechecked by your primary care provider next week.

## 2020-09-17 LAB — URINE CULTURE

## 2020-09-24 DIAGNOSIS — Z419 Encounter for procedure for purposes other than remedying health state, unspecified: Secondary | ICD-10-CM | POA: Diagnosis not present

## 2020-09-26 DIAGNOSIS — F341 Dysthymic disorder: Secondary | ICD-10-CM | POA: Diagnosis not present

## 2020-10-10 DIAGNOSIS — F341 Dysthymic disorder: Secondary | ICD-10-CM | POA: Diagnosis not present

## 2020-10-15 DIAGNOSIS — F341 Dysthymic disorder: Secondary | ICD-10-CM | POA: Diagnosis not present

## 2020-10-25 DIAGNOSIS — Z419 Encounter for procedure for purposes other than remedying health state, unspecified: Secondary | ICD-10-CM | POA: Diagnosis not present

## 2020-10-29 ENCOUNTER — Other Ambulatory Visit (HOSPITAL_COMMUNITY): Payer: Self-pay | Admitting: Internal Medicine

## 2020-10-29 DIAGNOSIS — E7849 Other hyperlipidemia: Secondary | ICD-10-CM | POA: Diagnosis not present

## 2020-10-29 DIAGNOSIS — R7303 Prediabetes: Secondary | ICD-10-CM | POA: Diagnosis not present

## 2020-10-29 DIAGNOSIS — Z6841 Body Mass Index (BMI) 40.0 and over, adult: Secondary | ICD-10-CM | POA: Diagnosis not present

## 2020-10-29 DIAGNOSIS — Z23 Encounter for immunization: Secondary | ICD-10-CM | POA: Diagnosis not present

## 2020-10-29 DIAGNOSIS — K219 Gastro-esophageal reflux disease without esophagitis: Secondary | ICD-10-CM | POA: Diagnosis not present

## 2020-10-29 DIAGNOSIS — Z0000 Encounter for general adult medical examination without abnormal findings: Secondary | ICD-10-CM | POA: Diagnosis not present

## 2020-10-29 DIAGNOSIS — E559 Vitamin D deficiency, unspecified: Secondary | ICD-10-CM | POA: Diagnosis not present

## 2020-10-31 ENCOUNTER — Other Ambulatory Visit (HOSPITAL_COMMUNITY): Payer: Self-pay | Admitting: Internal Medicine

## 2020-10-31 DIAGNOSIS — F341 Dysthymic disorder: Secondary | ICD-10-CM | POA: Diagnosis not present

## 2020-11-04 DIAGNOSIS — J069 Acute upper respiratory infection, unspecified: Secondary | ICD-10-CM | POA: Diagnosis not present

## 2020-11-04 DIAGNOSIS — R059 Cough, unspecified: Secondary | ICD-10-CM | POA: Diagnosis not present

## 2020-11-04 DIAGNOSIS — J029 Acute pharyngitis, unspecified: Secondary | ICD-10-CM | POA: Diagnosis not present

## 2020-11-04 DIAGNOSIS — Z20822 Contact with and (suspected) exposure to covid-19: Secondary | ICD-10-CM | POA: Diagnosis not present

## 2020-11-14 DIAGNOSIS — F341 Dysthymic disorder: Secondary | ICD-10-CM | POA: Diagnosis not present

## 2020-11-21 DIAGNOSIS — F341 Dysthymic disorder: Secondary | ICD-10-CM | POA: Diagnosis not present

## 2020-11-25 DIAGNOSIS — Z419 Encounter for procedure for purposes other than remedying health state, unspecified: Secondary | ICD-10-CM | POA: Diagnosis not present

## 2020-12-16 DIAGNOSIS — F341 Dysthymic disorder: Secondary | ICD-10-CM | POA: Diagnosis not present

## 2020-12-23 DIAGNOSIS — F341 Dysthymic disorder: Secondary | ICD-10-CM | POA: Diagnosis not present

## 2020-12-23 DIAGNOSIS — Z419 Encounter for procedure for purposes other than remedying health state, unspecified: Secondary | ICD-10-CM | POA: Diagnosis not present

## 2020-12-30 DIAGNOSIS — F341 Dysthymic disorder: Secondary | ICD-10-CM | POA: Diagnosis not present

## 2021-01-20 DIAGNOSIS — F341 Dysthymic disorder: Secondary | ICD-10-CM | POA: Diagnosis not present

## 2021-01-23 DIAGNOSIS — Z419 Encounter for procedure for purposes other than remedying health state, unspecified: Secondary | ICD-10-CM | POA: Diagnosis not present

## 2021-01-27 DIAGNOSIS — F341 Dysthymic disorder: Secondary | ICD-10-CM | POA: Diagnosis not present

## 2021-01-28 ENCOUNTER — Other Ambulatory Visit (HOSPITAL_COMMUNITY): Payer: Self-pay

## 2021-01-28 DIAGNOSIS — K219 Gastro-esophageal reflux disease without esophagitis: Secondary | ICD-10-CM | POA: Diagnosis not present

## 2021-01-28 DIAGNOSIS — E7849 Other hyperlipidemia: Secondary | ICD-10-CM | POA: Diagnosis not present

## 2021-01-28 DIAGNOSIS — R7303 Prediabetes: Secondary | ICD-10-CM | POA: Diagnosis not present

## 2021-01-28 DIAGNOSIS — Z6841 Body Mass Index (BMI) 40.0 and over, adult: Secondary | ICD-10-CM | POA: Diagnosis not present

## 2021-01-28 MED ORDER — FAMOTIDINE 20 MG PO TABS
20.0000 mg | ORAL_TABLET | Freq: Every morning | ORAL | 2 refills | Status: DC
Start: 2021-01-28 — End: 2024-02-23
  Filled 2021-07-08 (×2): qty 30, 30d supply, fill #0
  Filled 2021-09-10: qty 30, 30d supply, fill #1

## 2021-02-05 ENCOUNTER — Emergency Department (HOSPITAL_COMMUNITY)
Admission: EM | Admit: 2021-02-05 | Discharge: 2021-02-05 | Disposition: A | Discharge status: Home / Self Care | Payer: Medicaid Other | Attending: Emergency Medicine | Admitting: Emergency Medicine

## 2021-02-05 ENCOUNTER — Emergency Department (HOSPITAL_COMMUNITY): Payer: Medicaid Other

## 2021-02-05 DIAGNOSIS — Z79899 Other long term (current) drug therapy: Secondary | ICD-10-CM | POA: Diagnosis not present

## 2021-02-05 DIAGNOSIS — Z20822 Contact with and (suspected) exposure to covid-19: Secondary | ICD-10-CM | POA: Diagnosis not present

## 2021-02-05 DIAGNOSIS — K219 Gastro-esophageal reflux disease without esophagitis: Secondary | ICD-10-CM | POA: Diagnosis not present

## 2021-02-05 DIAGNOSIS — R072 Precordial pain: Secondary | ICD-10-CM | POA: Insufficient documentation

## 2021-02-05 DIAGNOSIS — R0789 Other chest pain: Secondary | ICD-10-CM

## 2021-02-05 DIAGNOSIS — R079 Chest pain, unspecified: Secondary | ICD-10-CM | POA: Diagnosis not present

## 2021-02-05 LAB — CBC
HCT: 35.3 % — ABNORMAL LOW (ref 36.0–46.0)
Hemoglobin: 10.7 g/dL — ABNORMAL LOW (ref 12.0–15.0)
MCH: 25.2 pg — ABNORMAL LOW (ref 26.0–34.0)
MCHC: 30.3 g/dL (ref 30.0–36.0)
MCV: 83.1 fL (ref 80.0–100.0)
Platelets: 498 10*3/uL — ABNORMAL HIGH (ref 150–400)
RBC: 4.25 MIL/uL (ref 3.87–5.11)
RDW: 15.9 % — ABNORMAL HIGH (ref 11.5–15.5)
WBC: 7.6 10*3/uL (ref 4.0–10.5)
nRBC: 0 % (ref 0.0–0.2)

## 2021-02-05 LAB — BASIC METABOLIC PANEL
Anion gap: 9 (ref 5–15)
BUN: 7 mg/dL (ref 6–20)
CO2: 26 mmol/L (ref 22–32)
Calcium: 8.8 mg/dL — ABNORMAL LOW (ref 8.9–10.3)
Chloride: 103 mmol/L (ref 98–111)
Creatinine, Ser: 0.75 mg/dL (ref 0.44–1.00)
GFR, Estimated: >60 mL/min (ref >60)
Glucose, Bld: 114 mg/dL — ABNORMAL HIGH (ref 70–99)
Potassium: 3.8 mmol/L (ref 3.5–5.1)
Sodium: 138 mmol/L (ref 135–145)

## 2021-02-05 LAB — I-STAT BETA HCG BLOOD, ED (MC, WL, AP ONLY): I-stat hCG, quantitative: <5 m[IU]/mL (ref <5)

## 2021-02-05 LAB — RESP PANEL BY RT-PCR (FLU A&B, COVID) ARPGX2
Influenza A by PCR: NEGATIVE
Influenza B by PCR: NEGATIVE
SARS Coronavirus 2 by RT PCR: NEGATIVE

## 2021-02-05 LAB — TROPONIN I (HIGH SENSITIVITY)
Troponin I (High Sensitivity): <2 ng/L (ref <18)
Troponin I (High Sensitivity): <2 ng/L (ref <18)

## 2021-02-05 MED ORDER — FAMOTIDINE 20 MG PO TABS
20.0000 mg | ORAL_TABLET | Freq: Once | ORAL | Status: AC
  Administered 2021-02-05: 20 mg via ORAL
  Filled 2021-02-05: qty 1

## 2021-02-05 MED ORDER — ACETAMINOPHEN 500 MG PO TABS
1000.0000 mg | ORAL_TABLET | Freq: Once | ORAL | Status: AC
  Administered 2021-02-05: 1000 mg via ORAL
  Filled 2021-02-05: qty 2

## 2021-02-05 MED ORDER — ALUM & MAG HYDROXIDE-SIMETH 200-200-20 MG/5ML PO SUSP
30.0000 mL | Freq: Once | ORAL | Status: AC
  Administered 2021-02-05: 30 mL via ORAL
  Filled 2021-02-05: qty 30

## 2021-02-05 NOTE — ED Triage Notes (Signed)
Emergency Medicine Provider Triage Evaluation Note  [name] [name] , a 30 y.o. female  was evaluated in triage.  Pt complains of chest pain since Tuesday .  Review of Systems  Positive: Chest pain w/ exertion or standing, shortness of breath, coughing,  Negative: Fevers, syncope, OCPS, leg swelling   Physical Exam  BP 114/62 (BP Location: Left Arm)   Pulse 86   Temp 99.2 F (37.3 C) (Oral)   Resp 16   LMP 01/08/2021   SpO2 99%  Gen:   Awake, no distress   HEENT:  Atraumatic  Resp:  Normal effort  Cardiac:  Normal rate; reproducible right sided chest pain  Abd:   Nondistended, nontender  MSK:   Moves extremities without difficulty  Neuro:  Speech clear   Medical Decision Making  Medically screening exam initiated at 12:29 PM.  Appropriate orders placed.  [name] [name] was informed that the remainder of the evaluation will be completed by another provider, this initial triage assessment does not replace that evaluation, and the importance of remaining in the ED until their evaluation is complete.  Clinical Impression  30 year old female here with intermittent chest pain since Tuesday, worse with walking and standing up.  She reports associated cough.  No abdominal pain, nausea, vomiting.  No known fevers.  She is well-appearing, speaking in full sentences, ambulating without difficulty.  Nondiaphoretic.  Labs ordered in triage.   Mare Ferrari, PA-C 02/05/21 1233

## 2021-02-05 NOTE — ED Triage Notes (Signed)
Pt arrived c/o chest pain that started Wednesday, denies trauma to the area  States when pain gets bad she get short of breath and dizzy Pain gets worse with movement  Denies hx of respiratory or cardiac illness

## 2021-02-05 NOTE — Discharge Instructions (Addendum)
It was our pleasure to provide your ER care today - we hope that you feel better.  Overall, your heart tests look good/normal.   Take acetaminophen or ibuprofen as need.  If gi symptoms, you may try pepcid or maalox as need.   Follow up with primary care doctor in 1-2 weeks.  Return to ER if worse, new symptoms, recurrent/persistent chest pain, increased trouble breathing, or other concern.

## 2021-02-05 NOTE — ED Provider Notes (Addendum)
[facility] Provider [facility]   CSN: [id number] Arrival date & time: 02/05/21  1134     History Chief Complaint  Patient presents with  . Chest Pain    [name] [name] is a 30 y.o. female.  Patient c/o mid to right chest pain for past two days. Symptoms at rest. Constant, dull, at times indigestion/heartburn type sensation, non radiating, not pleuritic, mod. Denies cough or uri symptoms. No fever or chills. No chest wall injury or strain. No exertional cp or discomfort. No unusual doe or fatigue. No nv, diaphoresis, or sob. No abd pain. No back/flank pain. No leg pain or swelling. No recent surgery, trauma, immobility. No hx dvt or pe. No personal hx cad. No fam hx cad.   The history is provided by the patient.  Chest Pain Associated symptoms: no abdominal pain, no back pain, no cough, no fever, no headache, no nausea, no shortness of breath and no vomiting        Past Medical History:  Diagnosis Date  . Acid reflux   . Anxiety    hx of anxiety attack  . Gonorrhea     Patient Active Problem List   Diagnosis Date Noted  . Learning disability 08/20/2013    Past Surgical History:  Procedure Laterality Date  . NO PAST SURGERIES    . WISDOM TOOTH EXTRACTION       OB History    Gravida  3   Para  3   Term  3   Preterm  0   AB  0   Living  3     SAB  0   IAB  0   Ectopic  0   Multiple  0   Live Births  3           Family History  Problem Relation Age of Onset  . Asthma Sister   . Hypertension Brother   . Asthma Brother   . Diabetes Maternal Grandmother   . Diabetes Maternal Grandfather   . Other Neg Hx     Social History   Tobacco Use  . Smoking status: Never Smoker  . Smokeless tobacco: Never Used  Vaping Use  . Vaping Use: Never used  Substance Use Topics  . Alcohol use: No  . Drug use: No    Home Medications Prior to Admission medications   Medication Sig Start Date End Date Taking? Authorizing  Provider  carbamide peroxide (DEBROX) 6.5 % OTIC solution Place 5 drops into the right ear 2 (two) times daily. Patient not taking: Reported on 07/29/2020 01/22/20   Darr, Gerilyn PilgrimJacob, PA-C  cetirizine (ZYRTEC ALLERGY) 10 MG tablet Take 1 tablet (10 mg total) by mouth daily for 14 days. 01/22/20 02/05/20  Darr, Gerilyn PilgrimJacob, PA-C  famotidine (PEPCID) 20 MG tablet TAKE 1 TABLET BY MOUTH EVERY MORNING 10/29/20 10/29/21  Fleet ContrasAvbuere, Edwin, MD  famotidine (PEPCID) 20 MG tablet TAKE 1 TABLET BY MOUTH [facility] M[facility] 09/10/20 09/10/21  Fleet ContrasAvbuere, Edwin, MD  famotidine (PEPCID) 20 MG tablet Take 1 tablet (20 mg total) by mouth in the morning. 01/28/21     fluticasone (FLONASE) 50 MCG/ACT nasal spray Place 1 spray into both nostrils daily. Patient not taking: Reported on 07/29/2020 01/22/20   Darr, Gerilyn PilgrimJacob, PA-C  ibuprofen (ADVIL) 800 MG tablet TAKE 1 TABLET (800 MG TOTAL) BY MOUTH EVERY 8 (EIGHT) HOURS AS NEEDED. 08/26/20 08/26/21  Brock BadHarper, Charles A, MD  metroNIDAZOLE (FLAGYL) 500 MG tablet Take 1 tablet (500 mg total) by  mouth 2 (two) times daily. Patient not taking: Reported on 01/22/2020 11/15/18   Elvina Sidle, MD  omeprazole (PRILOSEC) 20 MG capsule Take 20 mg by mouth 2 (two) times daily. 06/17/20   [provider]  triamcinolone (KENALOG) 0.1 % APPLY TO AFFECTED AREA(S) TWICE DAILY AS NEEDED 09/10/20 09/10/21  Fleet Contras, MD  Vitamin D, Ergocalciferol, (DRISDOL) 1.25 MG (50000 UNIT) CAPS capsule TAKE 1 CAPSULE BY MOUTH ONCE WEEKLY 10/31/20 10/31/21  Fleet Contras, MD    Allergies    Patient has no known allergies.  Review of Systems   Review of Systems  Constitutional: Negative for chills and fever.  HENT: Negative for sore throat.   Eyes: Negative for redness.  Respiratory: Negative for cough and shortness of breath.   Cardiovascular: Positive for chest pain.  Gastrointestinal: Negative for abdominal pain, nausea and vomiting.  Genitourinary: Negative for flank pain.  Musculoskeletal: Negative for back pain and  neck pain.  Skin: Negative for rash.  Neurological: Negative for headaches.  Hematological: Does not bruise/bleed easily.  Psychiatric/Behavioral: Negative for confusion.    Physical Exam Updated Vital Signs BP (!) 123/59   Pulse 67   Temp 98.2 F (36.8 C) (Oral)   Resp (!) 22   Ht 1.651 m (5\' 5" )   Wt 118.8 kg   LMP 01/08/2021   SpO2 100%   BMI 43.60 kg/m   Physical Exam Vitals and nursing note reviewed.  Constitutional:      Appearance: Normal appearance. She is well-developed.  HENT:     Head: Atraumatic.     Nose: Nose normal.     Mouth/Throat:     Mouth: Mucous membranes are moist.  Eyes:     General: No scleral icterus.    Conjunctiva/sclera: Conjunctivae normal.  Neck:     Trachea: No tracheal deviation.  Cardiovascular:     Rate and Rhythm: Normal rate and regular rhythm.     Pulses: Normal pulses.     Heart sounds: Normal heart sounds. No murmur heard. No friction rub. No gallop.   Pulmonary:     Effort: Pulmonary effort is normal. No respiratory distress.     Breath sounds: Normal breath sounds.  Chest:     Chest wall: No tenderness.  Abdominal:     General: Bowel sounds are normal. There is no distension.     Palpations: Abdomen is soft.     Tenderness: There is no abdominal tenderness. There is no guarding.  Genitourinary:    Comments: No cva tenderness.  Musculoskeletal:        General: No swelling or tenderness.     Cervical back: Normal range of motion and neck supple. No rigidity. No muscular tenderness.     Right lower leg: No edema.     Left lower leg: No edema.  Skin:    General: Skin is warm and dry.     Findings: No rash.  Neurological:     Mental Status: She is alert.     Comments: Alert, speech normal.   Psychiatric:        Mood and Affect: Mood normal.     ED Results / Procedures / Treatments   Labs (all labs ordered are listed, but only abnormal results are displayed) Results for orders placed or performed during the  hospital encounter of 02/05/21  Basic metabolic panel  Result Value Ref Range   Sodium 138 135 - 145 mmol/L   Potassium 3.8 3.5 - 5.1 mmol/L   Chloride 103 98 -  111 mmol/L   CO2 26 22 - 32 mmol/L   Glucose, Bld 114 (H) 70 - 99 mg/dL   BUN 7 6 - 20 mg/dL   Creatinine, Ser 6.97 0.44 - 1.00 mg/dL   Calcium 8.8 (L) 8.9 - 10.3 mg/dL   GFR, Estimated >94 >80 mL/min   Anion gap 9 5 - 15  CBC  Result Value Ref Range   WBC 7.6 4.0 - 10.5 K/uL   RBC 4.25 3.87 - 5.11 MIL/uL   Hemoglobin 10.7 (L) 12.0 - 15.0 g/dL   HCT 16.5 (L) 53.7 - 48.2 %   MCV 83.1 80.0 - 100.0 fL   MCH 25.2 (L) 26.0 - 34.0 pg   MCHC 30.3 30.0 - 36.0 g/dL   RDW 70.7 (H) 86.7 - 54.4 %   Platelets 498 (H) 150 - 400 K/uL   nRBC 0.0 0.0 - 0.2 %  I-Stat beta hCG blood, ED  Result Value Ref Range   I-stat hCG, quantitative <5.0 <5 mIU/mL   Comment 3          Troponin I (High Sensitivity)  Result Value Ref Range   Troponin I (High Sensitivity) <2 <18 ng/L  Troponin I (High Sensitivity)  Result Value Ref Range   Troponin I (High Sensitivity) <2 <18 ng/L   DG Chest 2 View  Result Date: 02/05/2021 CLINICAL DATA:  Chest pain. EXAM: CHEST - 2 VIEW COMPARISON:  Mar 06, 2020. FINDINGS: The heart size and mediastinal contours are within normal limits. Both lungs are clear. No pneumothorax or pleural effusion is noted. The visualized skeletal structures are unremarkable. IMPRESSION: No active cardiopulmonary disease. Electronically Signed   By: Lupita Raider M.D.   On: 02/05/2021 12:27    EKG EKG Interpretation  Date/Time:  Thursday February 05 2021 11:40:33 EDT Ventricular Rate:  84 PR Interval:  148 QRS Duration: 74 QT Interval:  358 QTC Calculation: 423 R Axis:   63 Text Interpretation: Normal sinus rhythm with sinus arrhythmia Normal ECG Confirmed by Cathren Laine ([id number]) on 02/05/2021 4:26:27 PM   Radiology DG Chest 2 View  Result Date: 02/05/2021 CLINICAL DATA:  Chest pain. EXAM: CHEST - 2 VIEW COMPARISON:  Mar 06, 2020. FINDINGS: The heart size and mediastinal contours are within normal limits. Both lungs are clear. No pneumothorax or pleural effusion is noted. The visualized skeletal structures are unremarkable. IMPRESSION: No active cardiopulmonary disease. Electronically Signed   By: Lupita Raider M.D.   On: 02/05/2021 12:27    Procedures Procedures   Medications Ordered in ED Medications  alum & mag hydroxide-simeth (MAALOX/MYLANTA) 200-200-20 MG/5ML suspension 30 mL (30 mLs Oral Given 02/05/21 1702)  famotidine (PEPCID) tablet 20 mg (20 mg Oral Given 02/05/21 1701)  acetaminophen (TYLENOL) tablet 1,000 mg (1,000 mg Oral Given 02/05/21 1700)    ED Course  I have reviewed the triage vital signs and the nursing notes.  Pertinent labs & imaging results that were available during my care of the patient were reviewed by me and considered in my medical decision making (see chart for details).    MDM Rules/Calculators/A&P                          Iv ns. Continuous pulse ox and cardiac monitoring. Stat labs. Cxr. Ecg.  Reviewed nursing notes and prior charts for additional history.   Labs reviewed/interpreted by me - trop normal - after symptoms present for 2 days, felt not c/w acs.  CXR reviewed/interpreted by me - no pna.  Acetaminophen po, pepcid po, maalox po.  Recheck pt comfortable nad.   Rec pcp f/u.  Return precautions provided.    Final Clinical Impression(s) / ED Diagnoses Final diagnoses:  Precordial chest pain  Atypical chest pain    Rx / DC Orders ED Discharge Orders    None         Cathren Laine, MD 02/05/21 1725

## 2021-02-10 DIAGNOSIS — F341 Dysthymic disorder: Secondary | ICD-10-CM | POA: Diagnosis not present

## 2021-02-22 DIAGNOSIS — Z419 Encounter for procedure for purposes other than remedying health state, unspecified: Secondary | ICD-10-CM | POA: Diagnosis not present

## 2021-02-24 DIAGNOSIS — F341 Dysthymic disorder: Secondary | ICD-10-CM | POA: Diagnosis not present

## 2021-03-03 DIAGNOSIS — F341 Dysthymic disorder: Secondary | ICD-10-CM | POA: Diagnosis not present

## 2021-03-10 DIAGNOSIS — F341 Dysthymic disorder: Secondary | ICD-10-CM | POA: Diagnosis not present

## 2021-03-13 ENCOUNTER — Other Ambulatory Visit (HOSPITAL_COMMUNITY): Payer: Self-pay

## 2021-03-13 MED FILL — Ibuprofen Tab 800 MG: ORAL | 10 days supply | Qty: 30 | Fill #0 | Status: AC

## 2021-03-25 DIAGNOSIS — Z419 Encounter for procedure for purposes other than remedying health state, unspecified: Secondary | ICD-10-CM | POA: Diagnosis not present

## 2021-04-03 DIAGNOSIS — F341 Dysthymic disorder: Secondary | ICD-10-CM | POA: Diagnosis not present

## 2021-04-24 ENCOUNTER — Ambulatory Visit (INDEPENDENT_AMBULATORY_CARE_PROVIDER_SITE_OTHER): Payer: Medicaid Other

## 2021-04-24 ENCOUNTER — Encounter: Payer: Self-pay | Admitting: Family Medicine

## 2021-04-24 ENCOUNTER — Other Ambulatory Visit: Payer: Self-pay

## 2021-04-24 ENCOUNTER — Ambulatory Visit (INDEPENDENT_AMBULATORY_CARE_PROVIDER_SITE_OTHER): Payer: Medicaid Other | Admitting: Family Medicine

## 2021-04-24 DIAGNOSIS — Z23 Encounter for immunization: Secondary | ICD-10-CM | POA: Diagnosis not present

## 2021-04-24 DIAGNOSIS — R87613 High grade squamous intraepithelial lesion on cytologic smear of cervix (HGSIL): Secondary | ICD-10-CM | POA: Diagnosis not present

## 2021-04-24 DIAGNOSIS — Z419 Encounter for procedure for purposes other than remedying health state, unspecified: Secondary | ICD-10-CM | POA: Diagnosis not present

## 2021-04-24 DIAGNOSIS — L659 Nonscarring hair loss, unspecified: Secondary | ICD-10-CM

## 2021-04-24 DIAGNOSIS — N946 Dysmenorrhea, unspecified: Secondary | ICD-10-CM | POA: Diagnosis not present

## 2021-04-24 NOTE — Patient Instructions (Signed)
It was great seeing you today!  Please check-out at the front desk before leaving the clinic. I'd like to see you back in 3-4 months for PAP smear but if you need to be seen earlier than that for any new issues we're happy to fit you in, just give [place] a call!  You were scheudled in the dermatology clinic on 05/21/21 for hairloss.   Visit Remembers: - Continue to work on your healthy eating habits and incorporating exercise into your daily life.  - Your goal is to have an BP < 120/80 - Medicine Changes: None - To Do: Schedule PAP in November 2022   Please bring all of your medications with you to each visit.    If you haven't already, sign up for My Chart to have easy access to your labs results, and communication with your primary care physician.  Feel free to call with any questions or concerns at any time, at [phone].   Take care,  Dr. Katherina [facility]

## 2021-04-24 NOTE — Progress Notes (Signed)
   SUBJECTIVE:   CHIEF COMPLAINT / HPI:   Chief Complaint  Patient presents with   Establish Care     [name] [name] is a 30 y.o. female here to establish care. Has concerns for hair loss and painful periods.  Patient reports scalp rash that scabs over mostly after a relaxer. She reports hair loss on the temporal region. States she has hair in her hands just after touching it.   Patient's last menstrual period was 04/13/2021. Painful periods. No contraception. Previously had an IUD but had really bad headaches. Depo provera when she was younger. States she is not in a relationship right now and is not sexually active. Has three children ages 62, 79 and 49.    PERTINENT  PMH / PSH: reviewed and updated as appropriate   OBJECTIVE:   BP 110/70   Pulse 76   Ht 5\' 5"  (1.651 m)   Wt 276 lb 8 oz (125.4 kg)   LMP 04/13/2021   SpO2 100%   BMI 46.01 kg/m    GEN: well appearing female in no acute distress  HENT: temporal thinning present, no scalp lesions today, minimal hair lost on hair sweep  CV: regular rate and rhythm, no murmurs appreciated  RESP: no increased work of breathing, clear to ascultation bilaterally ABD: Bowel sounds present. Soft, Nontender, Nondistended.  MSK: no edema, or cyanosis noted SKIN: warm, dry    ASSESSMENT/PLAN:   Dysmenorrhea Recommended hormonal birth control but pt declined. Practicing abstinence currently. Advised to take Iburpofen 600 mg daily one week prior to cycle beginning.   Hair loss Scheduled for derm clinic 05/21/21. Pt reports scalps in scalp after relaxing hair. Suspect relaxer related hair loss vs tension hair loss  HSIL (high grade squamous intraepithelial lesion) on Pap smear of cervix Had HSIL on Oct 2021 PAP. Dr. Nov 2021 performed a LEEP in 08/2020. Follow up for repeat PAP in Nov 2021       Dec 2021, DO PGY-2, [facility] 04/24/2021

## 2021-04-28 DIAGNOSIS — N946 Dysmenorrhea, unspecified: Secondary | ICD-10-CM | POA: Insufficient documentation

## 2021-04-28 DIAGNOSIS — L659 Nonscarring hair loss, unspecified: Secondary | ICD-10-CM | POA: Insufficient documentation

## 2021-04-28 DIAGNOSIS — R87613 High grade squamous intraepithelial lesion on cytologic smear of cervix (HGSIL): Secondary | ICD-10-CM | POA: Insufficient documentation

## 2021-04-28 NOTE — Assessment & Plan Note (Signed)
Had HSIL on Oct 2021 PAP. Dr. Debroah Loop performed a LEEP in 08/2020. Follow up for repeat PAP in Nov 2021

## 2021-04-28 NOTE — Assessment & Plan Note (Signed)
Scheduled for derm clinic 05/21/21. Pt reports scalps in scalp after relaxing hair. Suspect relaxer related hair loss vs tension hair loss

## 2021-04-28 NOTE — Assessment & Plan Note (Signed)
Recommended hormonal birth control but pt declined. Practicing abstinence currently. Advised to take Iburpofen 600 mg daily one week prior to cycle beginning.

## 2021-05-01 DIAGNOSIS — F341 Dysthymic disorder: Secondary | ICD-10-CM | POA: Diagnosis not present

## 2021-05-07 ENCOUNTER — Other Ambulatory Visit (HOSPITAL_COMMUNITY): Payer: Self-pay

## 2021-05-07 MED FILL — Ibuprofen Tab 800 MG: ORAL | 7 days supply | Qty: 22 | Fill #1 | Status: AC

## 2021-05-11 ENCOUNTER — Other Ambulatory Visit (HOSPITAL_COMMUNITY): Payer: Self-pay

## 2021-05-15 DIAGNOSIS — F341 Dysthymic disorder: Secondary | ICD-10-CM | POA: Diagnosis not present

## 2021-05-21 ENCOUNTER — Ambulatory Visit: Payer: Medicaid Other

## 2021-05-22 DIAGNOSIS — F341 Dysthymic disorder: Secondary | ICD-10-CM | POA: Diagnosis not present

## 2021-05-25 DIAGNOSIS — Z419 Encounter for procedure for purposes other than remedying health state, unspecified: Secondary | ICD-10-CM | POA: Diagnosis not present

## 2021-06-04 ENCOUNTER — Other Ambulatory Visit (HOSPITAL_COMMUNITY): Payer: Self-pay

## 2021-06-04 MED FILL — Ibuprofen Tab 800 MG: ORAL | 10 days supply | Qty: 30 | Fill #0 | Status: AC

## 2021-06-05 DIAGNOSIS — F341 Dysthymic disorder: Secondary | ICD-10-CM | POA: Diagnosis not present

## 2021-06-25 ENCOUNTER — Ambulatory Visit (INDEPENDENT_AMBULATORY_CARE_PROVIDER_SITE_OTHER): Payer: Medicaid Other | Admitting: Family Medicine

## 2021-06-25 ENCOUNTER — Other Ambulatory Visit (HOSPITAL_COMMUNITY): Payer: Self-pay

## 2021-06-25 ENCOUNTER — Other Ambulatory Visit: Payer: Self-pay

## 2021-06-25 DIAGNOSIS — L659 Nonscarring hair loss, unspecified: Secondary | ICD-10-CM

## 2021-06-25 DIAGNOSIS — H6123 Impacted cerumen, bilateral: Secondary | ICD-10-CM | POA: Diagnosis not present

## 2021-06-25 DIAGNOSIS — Z419 Encounter for procedure for purposes other than remedying health state, unspecified: Secondary | ICD-10-CM | POA: Diagnosis not present

## 2021-06-25 DIAGNOSIS — H6120 Impacted cerumen, unspecified ear: Secondary | ICD-10-CM | POA: Insufficient documentation

## 2021-06-25 MED ORDER — FLUOCINONIDE 0.05 % EX SOLN
1.0000 "application " | Freq: Two times a day (BID) | CUTANEOUS | 0 refills | Status: DC
  Filled 2021-06-25: qty 60, 3d supply, fill #0
  Filled 2021-07-01: qty 60, 1d supply, fill #0

## 2021-06-25 NOTE — Assessment & Plan Note (Signed)
She requested ear lavage.Multiple attempts made to wax out her ear wax was unsuccessful. With informed verbal consent, I attempted soft ear curette only on her left ear with no success as well. Her ear waxes are thick and dry. I discussed use of debrox and return in 1-2 weeks for lavage. Consider ENT referral in the future. She agreed with the plan.

## 2021-06-25 NOTE — Assessment & Plan Note (Signed)
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 Monitor closely for improvement.

## 2021-06-25 NOTE — Progress Notes (Signed)
    SUBJECTIVE:   CHIEF COMPLAINT / HPI:   Hair loss:  C/O frontal hair loss since she d/c Mirena IUD in 2014. She denies worsening symptoms. There is associated itchiness and occasional pain. She uses all sorts of hair shampoos at home and currently has a weave on, which was done one week ago.  Hearing muffled:  C/O left hearing loss since 2015. She attributed this to recurrent ear wax. Symptoms usually resolve after ear lavage. She had not had ear lavage done in a while, no ear pain or discharge.   PERTINENT  PMH / PSH: PMX reviewed  OBJECTIVE:   Vitals:   06/25/21 1610  BP: 113/70  Pulse: 82  SpO2: 100%  Weight: 278 lb 9.6 oz (126.4 kg)  Height: 5\' 5"  (1.651 m)    Physical Exam Vitals and nursing note reviewed.  Constitutional:      Appearance: Normal appearance.  HENT:     Head: Normocephalic.     Right Ear: Ear canal normal. No tenderness. There is impacted cerumen.     Left Ear: Ear canal normal. No tenderness. There is impacted cerumen.  Skin:    Comments: Small, scattered and scanty area of hair loss on her temporal legion B/L. These areas are dry with scaliness.  Dry scaly lesion of her frontal scalp region without obvious hair loss.      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 Monitor closely for improvement.  Cerumen impaction She requested ear lavage.Multiple attempts made to wax out her ear wax was unsuccessful. With informed verbal consent, I attempted soft ear curette only on her left ear with no success as well. Her ear waxes are thick and dry. I discussed use of debrox and return in 1-2 weeks for lavage. Consider ENT referral in the future. She agreed with the plan.      , MD [facility]

## 2021-06-25 NOTE — Patient Instructions (Signed)
Seborrheic Dermatitis, Adult Seborrheic dermatitis is a skin disease that causes red, scaly patches. It usually occurs on the scalp, and it is often called dandruff. The patches may appear on other parts of the body. Skin patches tend to appear where there are many oil glands in the skin. Areas of the body that are commonly affected include the: Scalp. Ears. Eyebrows. Face. Bearded area of men's faces. Skin folds of the body, such as the armpits, groin, and buttocks. Chest. The condition may come and go for no known reason, and it is often long-lasting (chronic). What are the causes? The cause of this condition is not known. What increases the risk? The following factors may make you more likely to develop this condition: Having certain conditions, such as: HIV (human immunodeficiency virus). AIDS (acquired immunodeficiency syndrome). Parkinson's disease. Mood disorders, such as depression. Being 40-60 years old. What are the signs or symptoms? Symptoms of this condition include: Thick scales on the scalp. Redness on the face or in the armpits. Skin that is flaky. The flakes may be white or yellow. Skin that seems oily or dry but is not helped with moisturizers. Itching or burning in the affected areas. How is this diagnosed? This condition is diagnosed with a medical history and physical exam. A sample of your skin may be tested (skin biopsy). You may need to see a skin specialist (dermatologist). How is this treated? There is no cure for this condition, but treatment can help to manage the symptoms. You may get treatment to remove scales, lower the risk of skin infection, and reduce swelling or itching. Treatment may include: Creams that reduce skin yeast. Medicated shampoo. Moisturizing creams or ointments. Creams that reduce swelling and irritation (steroids). Follow these instructions at home: Apply over-the-counter and prescription medicines only as told by your health care  provider. Use any medicated shampoo, skin creams, or ointments only as told by your health care provider. Keep all follow-up visits as told by your health care provider. This is important. Contact a health care provider if: Your symptoms do not improve with treatment. Your symptoms get worse. You have new symptoms. Get help right away if: Your condition rapidly worsens with treatment. Summary Seborrheic dermatitis is a skin disease that causes red, scaly patches. Seborrheic dermatitis commonly affects the scalp, face, and skin folds. There is no cure for this condition, but treatment can help to manage the symptoms. This information is not intended to replace advice given to you by your health care provider. Make sure you discuss any questions you have with your health care provider. Document Revised: 07/19/2019 Document Reviewed: 07/19/2019 Elsevier Patient Education  2022 Elsevier Inc.  

## 2021-07-01 ENCOUNTER — Other Ambulatory Visit (HOSPITAL_COMMUNITY): Payer: Self-pay

## 2021-07-03 DIAGNOSIS — F341 Dysthymic disorder: Secondary | ICD-10-CM | POA: Diagnosis not present

## 2021-07-08 ENCOUNTER — Other Ambulatory Visit (HOSPITAL_COMMUNITY): Payer: Self-pay

## 2021-07-10 DIAGNOSIS — F341 Dysthymic disorder: Secondary | ICD-10-CM | POA: Diagnosis not present

## 2021-07-10 DIAGNOSIS — H5213 Myopia, bilateral: Secondary | ICD-10-CM | POA: Diagnosis not present

## 2021-07-16 ENCOUNTER — Ambulatory Visit (INDEPENDENT_AMBULATORY_CARE_PROVIDER_SITE_OTHER): Payer: Medicaid Other | Admitting: Family Medicine

## 2021-07-16 ENCOUNTER — Other Ambulatory Visit (HOSPITAL_COMMUNITY): Payer: Self-pay

## 2021-07-16 ENCOUNTER — Other Ambulatory Visit (HOSPITAL_COMMUNITY)
Admission: RE | Admit: 2021-07-16 | Discharge: 2021-07-16 | Disposition: A | Discharge status: Home / Self Care | Payer: Medicaid Other | Source: Ambulatory Visit | Attending: Family Medicine | Admitting: Family Medicine

## 2021-07-16 ENCOUNTER — Other Ambulatory Visit: Payer: Self-pay

## 2021-07-16 VITALS — BP 108/78 | HR 73 | Ht 65.0 in | Wt 275.6 lb

## 2021-07-16 DIAGNOSIS — B373 Candidiasis of vulva and vagina: Secondary | ICD-10-CM | POA: Diagnosis not present

## 2021-07-16 DIAGNOSIS — R102 Pelvic and perineal pain: Secondary | ICD-10-CM

## 2021-07-16 DIAGNOSIS — B3731 Acute candidiasis of vulva and vagina: Secondary | ICD-10-CM

## 2021-07-16 DIAGNOSIS — Z3169 Encounter for other general counseling and advice on procreation: Secondary | ICD-10-CM

## 2021-07-16 LAB — POCT WET PREP (WET MOUNT)
Clue Cells Wet Prep Whiff POC: NEGATIVE
Trichomonas Wet Prep HPF POC: ABSENT

## 2021-07-16 LAB — POCT URINE PREGNANCY: Preg Test, Ur: NEGATIVE

## 2021-07-16 MED ORDER — FLUCONAZOLE 150 MG PO TABS
150.0000 mg | ORAL_TABLET | Freq: Once | ORAL | 0 refills | Status: AC
  Filled 2021-07-16: qty 1, 1d supply, fill #0

## 2021-07-16 MED ORDER — FOLIC ACID 400 MCG PO TABS
400.0000 ug | ORAL_TABLET | Freq: Every day | ORAL | 3 refills | Status: DC
  Filled 2021-07-16: qty 90, 90d supply, fill #0

## 2021-07-16 NOTE — Progress Notes (Signed)
    SUBJECTIVE:   CHIEF COMPLAINT / HPI: vaginal pain  Patient reports vaginal pain since 09/03.  Throbbing with intermittent itching and white discharge.  Sexually active with 1 female partner who does not have any STI symptoms.  Last sexual encounter 09/01. Denies any dyspareunia.  Has had some vaginal spotting.  Not on any OCP.  Uses condoms for birth control. Denies use of sex toys or tampons.  Denies any fevers, urinary symptoms  PERTINENT  PMH / PSH:  Dysmenorrhea HSIL  OBJECTIVE:   BP 108/78   Pulse 73   Ht 5\' 5"  (1.651 m)   Wt 275 lb 9.6 oz (125 kg)   LMP 06/27/2021 (Approximate)   SpO2 96%   BMI 45.86 kg/m    General: Alert, no acute distress Pelvic Exam chaperoned by [facility] April         External: normal female genitalia without lesions or masses        Introitus: difficult to insert speculum, patient tense during examination.  Asked if she wanted to proceed and she confirmed continuation of exam.  Pelvic musculature very tense on bimanual exam. No adnexal masses palpated.        Vagina: normal without lesions or masses        Cervix: normal without lesions or masses          ASSESSMENT/PLAN:   Vaginal pain UPT negative Declined for contraception, would recommend continued discussion Pap smear: Due in Nov 2022, patient will schedule an appointment  Samples for Wet prep, GC/Chlamydia obtained HIV, Hep C today Positive KOH, Diflucan 150 mg x1 Folic Acid 400 mcg daily Refer PT for pelvic floor therapy, possible vaginismus       07-22-2006, MD [facility]

## 2021-07-16 NOTE — Patient Instructions (Signed)
Thank you for coming to see me today. It was a pleasure. Today we talked about:   We will get some labs today.  If they are abnormal or we need to do something about them, I will call you.  If they are normal, I will send you a message on MyChart (if it is active) or a letter in the mail.  If you don't hear from [place] in 2 weeks, please call the office at the number below.   Please follow-up with PCP as needed  If you have any questions or concerns, please do not hesitate to call the office at [phone].  Best,   Dana Allan, MD    Kegel Exercises Kegel exercises can help strengthen your pelvic floor muscles. The pelvic floor is a group of muscles that support your rectum, small intestine, and bladder. In females, pelvic floor muscles also help support the womb (uterus). These muscles help you control the flow of urine and stool. Kegel exercises are painless and simple, and they do not require any equipment. Your provider may suggest Kegel exercises to: Improve bladder and bowel control. Improve sexual response. Improve weak pelvic floor muscles after surgery to remove the uterus (hysterectomy) or pregnancy (females). Improve weak pelvic floor muscles after prostate gland removal or surgery (males). Kegel exercises involve squeezing your pelvic floor muscles, which are the same muscles you squeeze when you try to stop the flow of urine or keep from passing gas. The exercises can be done while sitting, standing, or lying down, but it is best to vary your position. Exercises How to do Kegel exercises: Squeeze your pelvic floor muscles tight. You should feel a tight lift in your rectal area. If you are a female, you should also feel a tightness in your vaginal area. Keep your stomach, buttocks, and legs relaxed. Hold the muscles tight for up to 10 seconds. Breathe normally. Relax your muscles. Repeat as told by your health care provider. Repeat this exercise daily as told by your health  care provider. Continue to do this exercise for at least 4-6 weeks, or for as long as told by your health care provider. You may be referred to a physical therapist who can help you learn more about how to do Kegel exercises. Depending on your condition, your health care provider may recommend: Varying how long you squeeze your muscles. Doing several sets of exercises every day. Doing exercises for several weeks. Making Kegel exercises a part of your regular exercise routine. This information is not intended to replace advice given to you by your health care provider. Make sure you discuss any questions you have with your health care provider. Document Revised: 10/01/2020 Document Reviewed: 05/31/2018 Elsevier Patient Education  2022 [employer].

## 2021-07-17 LAB — CERVICOVAGINAL ANCILLARY ONLY
Chlamydia: NEGATIVE
Comment: NEGATIVE
Comment: NEGATIVE
Comment: NORMAL
Neisseria Gonorrhea: NEGATIVE
Trichomonas: NEGATIVE

## 2021-07-19 ENCOUNTER — Encounter: Payer: Self-pay | Admitting: Family Medicine

## 2021-07-19 DIAGNOSIS — R102 Pelvic and perineal pain: Secondary | ICD-10-CM | POA: Insufficient documentation

## 2021-07-19 LAB — HCV AB W REFLEX TO QUANT PCR: HCV Ab: <0.1 s/co ratio (ref 0.0–0.9)

## 2021-07-19 LAB — RPR W/REFLEX TO TREPSURE: RPR: NONREACTIVE

## 2021-07-19 LAB — T PALLIDUM ANTIBODY, EIA: T pallidum Antibody, EIA: NEGATIVE

## 2021-07-19 LAB — HCV INTERPRETATION

## 2021-07-19 LAB — HIV ANTIBODY (ROUTINE TESTING W REFLEX): HIV Screen 4th Generation wRfx: NONREACTIVE

## 2021-07-19 NOTE — Assessment & Plan Note (Addendum)
UPT negative Declined for contraception, would recommend continued discussion Pap smear: Due in Nov 2022, patient will schedule an appointment  Samples for Wet prep, GC/Chlamydia obtained HIV, Hep C today Positive KOH, Diflucan 150 mg x1 Folic Acid 400 mcg daily Refer PT for pelvic floor therapy, possible vaginismus

## 2021-07-20 ENCOUNTER — Other Ambulatory Visit (HOSPITAL_COMMUNITY): Payer: Self-pay

## 2021-07-24 ENCOUNTER — Other Ambulatory Visit (HOSPITAL_COMMUNITY): Payer: Self-pay

## 2021-07-24 MED FILL — Ibuprofen Tab 800 MG: ORAL | 10 days supply | Qty: 30 | Fill #1 | Status: AC

## 2021-07-25 DIAGNOSIS — Z419 Encounter for procedure for purposes other than remedying health state, unspecified: Secondary | ICD-10-CM | POA: Diagnosis not present

## 2021-08-01 DIAGNOSIS — F341 Dysthymic disorder: Secondary | ICD-10-CM | POA: Diagnosis not present

## 2021-08-07 DIAGNOSIS — F341 Dysthymic disorder: Secondary | ICD-10-CM | POA: Diagnosis not present

## 2021-08-16 DIAGNOSIS — F341 Dysthymic disorder: Secondary | ICD-10-CM | POA: Diagnosis not present

## 2021-08-21 DIAGNOSIS — F341 Dysthymic disorder: Secondary | ICD-10-CM | POA: Diagnosis not present

## 2021-08-25 DIAGNOSIS — Z419 Encounter for procedure for purposes other than remedying health state, unspecified: Secondary | ICD-10-CM | POA: Diagnosis not present

## 2021-09-05 DIAGNOSIS — F341 Dysthymic disorder: Secondary | ICD-10-CM | POA: Diagnosis not present

## 2021-09-10 ENCOUNTER — Other Ambulatory Visit (HOSPITAL_COMMUNITY): Payer: Self-pay

## 2021-09-16 ENCOUNTER — Other Ambulatory Visit: Payer: Self-pay | Admitting: Obstetrics

## 2021-09-16 ENCOUNTER — Other Ambulatory Visit (HOSPITAL_COMMUNITY): Payer: Self-pay

## 2021-09-16 DIAGNOSIS — N946 Dysmenorrhea, unspecified: Secondary | ICD-10-CM

## 2021-09-16 MED ORDER — IBUPROFEN 800 MG PO TABS
800.0000 mg | ORAL_TABLET | Freq: Three times a day (TID) | ORAL | 1 refills | Status: DC | PRN
  Filled 2021-09-16 – 2021-10-14 (×3): qty 30, 10d supply, fill #0
  Filled 2022-02-25: qty 30, 10d supply, fill #1

## 2021-09-24 ENCOUNTER — Other Ambulatory Visit (HOSPITAL_COMMUNITY): Payer: Self-pay

## 2021-09-24 DIAGNOSIS — Z419 Encounter for procedure for purposes other than remedying health state, unspecified: Secondary | ICD-10-CM | POA: Diagnosis not present

## 2021-09-30 ENCOUNTER — Other Ambulatory Visit (HOSPITAL_COMMUNITY): Payer: Self-pay

## 2021-10-01 ENCOUNTER — Ambulatory Visit (INDEPENDENT_AMBULATORY_CARE_PROVIDER_SITE_OTHER): Payer: Medicaid Other | Admitting: Family Medicine

## 2021-10-01 ENCOUNTER — Other Ambulatory Visit: Payer: Self-pay

## 2021-10-01 ENCOUNTER — Encounter: Payer: Self-pay | Admitting: Family Medicine

## 2021-10-01 ENCOUNTER — Other Ambulatory Visit (HOSPITAL_COMMUNITY)
Admission: RE | Admit: 2021-10-01 | Discharge: 2021-10-01 | Disposition: A | Discharge status: Home / Self Care | Payer: Medicaid Other | Source: Ambulatory Visit | Attending: Family Medicine | Admitting: Family Medicine

## 2021-10-01 VITALS — BP 130/79 | HR 98 | Wt 286.2 lb

## 2021-10-01 DIAGNOSIS — R87613 High grade squamous intraepithelial lesion on cytologic smear of cervix (HGSIL): Secondary | ICD-10-CM

## 2021-10-01 DIAGNOSIS — Z124 Encounter for screening for malignant neoplasm of cervix: Secondary | ICD-10-CM | POA: Insufficient documentation

## 2021-10-01 DIAGNOSIS — Z23 Encounter for immunization: Secondary | ICD-10-CM

## 2021-10-01 NOTE — Progress Notes (Signed)
   SUBJECTIVE:   CHIEF COMPLAINT / HPI:   Chief Complaint  Patient presents with   Gynecologic Exam     [name] [name] is a 30 y.o. female here for PAP Smear.  Has history of abnormal Pap smears and required a LEEP in 2021.  Denies tobacco use, alcohol use, illicit drug use.  She declines STD testing today.   PERTINENT  PMH / PSH: reviewed and updated as appropriate   OBJECTIVE:   BP 130/79   Pulse 98   Wt 286 lb 4 oz (129.8 kg)   LMP 09/23/2021   SpO2 100%   BMI 47.63 kg/m    GEN: well appearing female in no acute distress  CVS: well perfused  RESP: speaking in full sentences without pause  ABD: soft, non-tender, non-distended, no palpable masses  Pelvic exam: normal external genitalia, vulva, VAGINA and CERVIX: normal appearing cervix without discharge or lesions, PAP collected, exam chaperoned by CMA Tashira .    ASSESSMENT/PLAN:   HSIL (high grade squamous intraepithelial lesion) on Pap smear of cervix Had HSIL on Oct 2021 PAP. Dr. Debroah Loop performed a LEEP in 08/2020. Though difficulty PAP, it was collected today. Will need longer metal speculum at next PAP/pelvic exam. Declined STI testing as it was recently completed in September.    Patient's last menstrual period was 09/23/2021. Declined contraception as she is not currently sexually active. Continue to discuss contraception at future visits.    Katha Cabal, DO PGY-3, [facility] 10/01/2021

## 2021-10-01 NOTE — Patient Instructions (Addendum)
You may need additional procedures or repeat PAP in a 6-12 months following your results. I will send you a letter or call you to let you know your next steps.   Take Care  Dr. Rachael Darby

## 2021-10-01 NOTE — Assessment & Plan Note (Addendum)
Had HSIL on Oct 2021 PAP. Dr. Debroah Loop performed a LEEP in 08/2020. Though difficulty PAP, it was collected today. Will need longer metal speculum at next PAP/pelvic exam. Declined STI testing as it was recently completed in September.

## 2021-10-06 LAB — CYTOLOGY - PAP
Adequacy: ABSENT
Comment: NEGATIVE
Diagnosis: UNDETERMINED — AB
High risk HPV: NEGATIVE

## 2021-10-08 ENCOUNTER — Other Ambulatory Visit (HOSPITAL_COMMUNITY): Payer: Self-pay

## 2021-10-09 DIAGNOSIS — F341 Dysthymic disorder: Secondary | ICD-10-CM | POA: Diagnosis not present

## 2021-10-14 ENCOUNTER — Other Ambulatory Visit (HOSPITAL_COMMUNITY): Payer: Self-pay

## 2021-10-23 DIAGNOSIS — F341 Dysthymic disorder: Secondary | ICD-10-CM | POA: Diagnosis not present

## 2021-10-25 DIAGNOSIS — Z419 Encounter for procedure for purposes other than remedying health state, unspecified: Secondary | ICD-10-CM | POA: Diagnosis not present

## 2021-11-03 DIAGNOSIS — F341 Dysthymic disorder: Secondary | ICD-10-CM | POA: Diagnosis not present

## 2021-11-06 DIAGNOSIS — F341 Dysthymic disorder: Secondary | ICD-10-CM | POA: Diagnosis not present

## 2021-11-13 DIAGNOSIS — F341 Dysthymic disorder: Secondary | ICD-10-CM | POA: Diagnosis not present

## 2021-11-20 DIAGNOSIS — F341 Dysthymic disorder: Secondary | ICD-10-CM | POA: Diagnosis not present

## 2021-11-24 NOTE — Progress Notes (Signed)
° ° ° °  SUBJECTIVE:   CHIEF COMPLAINT / HPI:   [name] [name] is a 31 y.o. female presents for urinary symptoms  Urinary symptoms Patient reports 1 week hx of dysuria and frequency.  She feels that she has a UTI.  Denies hematuria, fevers or back pain.  Patient is sexually active with 1 female and does not use birth control/condoms.  [place] Office Visit from 11/25/2021 in [place]  PHQ-9 Total Score 0       There are no preventive care reminders to display for this patient.   PERTINENT  PMH / PSH: Learning disability, dysmenorrhea  OBJECTIVE:   BP 130/80    Pulse 80    Ht 5\' 5"  (1.651 m)    Wt 283 lb 12.8 oz (128.7 kg)    LMP 10/19/2021 (Approximate)    SpO2 100%    BMI 47.23 kg/m    General: Alert, no acute distress Cardio: Well-perfused Pulm:  normal work of breathing Abdomen: Bowel sounds normal. Abdomen soft and non-tender.  No renal angle tenderness Neuro: Cranial nerves grossly intact   ASSESSMENT/PLAN:   UTI (urinary tract infection) UA negative today but will treat as UTI given pt is symptomatic and has had UTIs in the past.  Low suspicion for pyelonephritis.  Unfortunately unable to send urine culture due to lab closure today. Treat with keflex for 7 days. Follow up with PCP if persistent sx.   Health maintenance examination Recommend STD screening.  Unable to do this today due to lab closure.  Patient will book another appointment for this.    Lattie Haw, MD PGY-3 [place]

## 2021-11-25 ENCOUNTER — Other Ambulatory Visit: Payer: Self-pay

## 2021-11-25 ENCOUNTER — Ambulatory Visit (INDEPENDENT_AMBULATORY_CARE_PROVIDER_SITE_OTHER): Payer: Medicaid Other | Admitting: Family Medicine

## 2021-11-25 ENCOUNTER — Other Ambulatory Visit (HOSPITAL_COMMUNITY): Payer: Self-pay

## 2021-11-25 VITALS — BP 130/80 | HR 80 | Ht 65.0 in | Wt 283.8 lb

## 2021-11-25 DIAGNOSIS — R300 Dysuria: Secondary | ICD-10-CM

## 2021-11-25 DIAGNOSIS — N390 Urinary tract infection, site not specified: Secondary | ICD-10-CM | POA: Diagnosis not present

## 2021-11-25 DIAGNOSIS — Z0000 Encounter for general adult medical examination without abnormal findings: Secondary | ICD-10-CM | POA: Diagnosis not present

## 2021-11-25 DIAGNOSIS — Z419 Encounter for procedure for purposes other than remedying health state, unspecified: Secondary | ICD-10-CM | POA: Diagnosis not present

## 2021-11-25 LAB — POCT URINALYSIS DIP (MANUAL ENTRY)
Bilirubin, UA: NEGATIVE
Blood, UA: NEGATIVE
Glucose, UA: NEGATIVE mg/dL
Leukocytes, UA: NEGATIVE
Nitrite, UA: NEGATIVE
Protein Ur, POC: NEGATIVE mg/dL
Spec Grav, UA: 1.025 (ref 1.010–1.025)
Urobilinogen, UA: 0.2 E.U./dL
pH, UA: 6 (ref 5.0–8.0)

## 2021-11-25 MED ORDER — CEPHALEXIN 500 MG PO CAPS
500.0000 mg | ORAL_CAPSULE | Freq: Two times a day (BID) | ORAL | 0 refills | Status: DC
  Filled 2021-11-25: qty 14, 7d supply, fill #0

## 2021-11-25 NOTE — Patient Instructions (Signed)
Thank you for coming to see me today. It was a pleasure. Today we discussed your urine symptoms. I recommend  starting keflex for a UTI  Please follow-up with PCP for STD testing  If you have any questions or concerns, please do not hesitate to call the office at [phone].  Best wishes,   Dr Allena Katz

## 2021-11-26 NOTE — Progress Notes (Signed)
° ° °  SUBJECTIVE:   CHIEF COMPLAINT / HPI:   Vaginal Discharge: Patient is a 31 y.o. female presenting with vaginal discharge for about 5 days.  She states the discharge is whitish in color.  She is interested in screening for sexually transmitted infections today.  PERTINENT  PMH / PSH: None relevant  OBJECTIVE:   BP 113/71    Pulse 97    Ht 5\' 5"  (1.651 m)    Wt 284 lb 2 oz (128.9 kg)    LMP 11/09/2021    SpO2 100%    BMI 47.28 kg/m    General: NAD, pleasant, able to participate in exam Respiratory: Normal effort, no obvious respiratory distress Pelvic: VULVA: normal appearing vulva with no masses, tenderness or lesions, VAGINA: Normal appearing vagina with normal color, no lesions, with copious and white discharge present Chaperone Tashira present for pelvic exam  ASSESSMENT/PLAN:    Vaginal discharge   STI screening 31 y.o. female with vaginal discharge for 5 days. Physical exam significant for copious white discharge.  Wet prep performed today shows occasional yeast consistent with Candida infection.  Patient is interested in STI screening.   Plan: -Wet prep as above.  Will treat with Diflucan. -GC/chlamydia pending -Will check HIV and RPR  Lurline Del, [place]

## 2021-11-27 ENCOUNTER — Ambulatory Visit: Payer: Medicaid Other

## 2021-11-27 ENCOUNTER — Ambulatory Visit (INDEPENDENT_AMBULATORY_CARE_PROVIDER_SITE_OTHER): Payer: Medicaid Other | Admitting: Family Medicine

## 2021-11-27 ENCOUNTER — Other Ambulatory Visit (HOSPITAL_COMMUNITY)
Admission: RE | Admit: 2021-11-27 | Discharge: 2021-11-27 | Disposition: A | Discharge status: Home / Self Care | Payer: Medicaid Other | Source: Ambulatory Visit | Attending: Family Medicine | Admitting: Family Medicine

## 2021-11-27 ENCOUNTER — Other Ambulatory Visit: Payer: Self-pay

## 2021-11-27 ENCOUNTER — Other Ambulatory Visit (HOSPITAL_COMMUNITY): Payer: Self-pay

## 2021-11-27 VITALS — BP 113/71 | HR 97 | Ht 65.0 in | Wt 284.1 lb

## 2021-11-27 DIAGNOSIS — N898 Other specified noninflammatory disorders of vagina: Secondary | ICD-10-CM | POA: Diagnosis not present

## 2021-11-27 DIAGNOSIS — Z113 Encounter for screening for infections with a predominantly sexual mode of transmission: Secondary | ICD-10-CM | POA: Insufficient documentation

## 2021-11-27 DIAGNOSIS — Z0000 Encounter for general adult medical examination without abnormal findings: Secondary | ICD-10-CM | POA: Insufficient documentation

## 2021-11-27 DIAGNOSIS — Z114 Encounter for screening for human immunodeficiency virus [HIV]: Secondary | ICD-10-CM | POA: Diagnosis not present

## 2021-11-27 LAB — POCT WET PREP (WET MOUNT)
Clue Cells Wet Prep Whiff POC: NEGATIVE
Trichomonas Wet Prep HPF POC: ABSENT

## 2021-11-27 MED ORDER — FLUCONAZOLE 150 MG PO TABS
150.0000 mg | ORAL_TABLET | Freq: Once | ORAL | 0 refills | Status: AC
  Filled 2021-11-27: qty 1, 1d supply, fill #0

## 2021-11-27 NOTE — Patient Instructions (Signed)
Today we performed wet prep and STD testing.  The wet prep showed signs of a yeast infection.  I prescribed Diflucan for you to take.  This is 1 pill 1 time and should treat the infection.  I will let you know the results of the gonorrhea/chlamydia, HIV, syphilis testing when they return.  If we need to do anything about these I will call you, otherwise I will send a MyChart message.

## 2021-11-27 NOTE — Assessment & Plan Note (Signed)
Recommend STD screening.  Unable to do this today due to lab closure.  Patient will book another appointment for this.

## 2021-11-27 NOTE — Assessment & Plan Note (Addendum)
UA negative today but will treat as UTI given pt is symptomatic and has had UTIs in the past.  Low suspicion for pyelonephritis.  Unfortunately unable to send urine culture due to lab closure today. Treat with keflex for 7 days. Follow up with PCP if persistent sx.

## 2021-11-28 LAB — HIV ANTIBODY (ROUTINE TESTING W REFLEX): HIV Screen 4th Generation wRfx: NONREACTIVE

## 2021-11-28 LAB — RPR: RPR Ser Ql: NONREACTIVE

## 2021-11-30 LAB — CERVICOVAGINAL ANCILLARY ONLY
Chlamydia: NEGATIVE
Comment: NEGATIVE
Comment: NORMAL
Neisseria Gonorrhea: NEGATIVE

## 2021-12-09 ENCOUNTER — Ambulatory Visit: Payer: Medicaid Other | Admitting: Physical Therapy

## 2021-12-15 ENCOUNTER — Encounter: Payer: Medicaid Other | Attending: Family Medicine | Admitting: Physical Therapy

## 2021-12-19 DIAGNOSIS — J029 Acute pharyngitis, unspecified: Secondary | ICD-10-CM | POA: Diagnosis not present

## 2021-12-19 DIAGNOSIS — Z20822 Contact with and (suspected) exposure to covid-19: Secondary | ICD-10-CM | POA: Diagnosis not present

## 2021-12-19 DIAGNOSIS — J069 Acute upper respiratory infection, unspecified: Secondary | ICD-10-CM | POA: Diagnosis not present

## 2021-12-20 DIAGNOSIS — F341 Dysthymic disorder: Secondary | ICD-10-CM | POA: Diagnosis not present

## 2021-12-22 ENCOUNTER — Encounter: Payer: Medicaid Other | Admitting: Physical Therapy

## 2021-12-23 DIAGNOSIS — Z419 Encounter for procedure for purposes other than remedying health state, unspecified: Secondary | ICD-10-CM | POA: Diagnosis not present

## 2021-12-29 ENCOUNTER — Encounter: Payer: Medicaid Other | Admitting: Physical Therapy

## 2022-01-05 ENCOUNTER — Encounter: Payer: Medicaid Other | Admitting: Physical Therapy

## 2022-01-09 DIAGNOSIS — F341 Dysthymic disorder: Secondary | ICD-10-CM | POA: Diagnosis not present

## 2022-01-22 DIAGNOSIS — F341 Dysthymic disorder: Secondary | ICD-10-CM | POA: Diagnosis not present

## 2022-01-23 DIAGNOSIS — Z419 Encounter for procedure for purposes other than remedying health state, unspecified: Secondary | ICD-10-CM | POA: Diagnosis not present

## 2022-02-22 DIAGNOSIS — Z419 Encounter for procedure for purposes other than remedying health state, unspecified: Secondary | ICD-10-CM | POA: Diagnosis not present

## 2022-02-25 ENCOUNTER — Other Ambulatory Visit (HOSPITAL_COMMUNITY): Payer: Self-pay

## 2022-03-25 DIAGNOSIS — Z419 Encounter for procedure for purposes other than remedying health state, unspecified: Secondary | ICD-10-CM | POA: Diagnosis not present

## 2022-03-30 ENCOUNTER — Encounter: Payer: Self-pay | Admitting: *Deleted

## 2022-04-24 DIAGNOSIS — Z419 Encounter for procedure for purposes other than remedying health state, unspecified: Secondary | ICD-10-CM | POA: Diagnosis not present

## 2022-05-25 DIAGNOSIS — Z419 Encounter for procedure for purposes other than remedying health state, unspecified: Secondary | ICD-10-CM | POA: Diagnosis not present

## 2022-06-23 ENCOUNTER — Other Ambulatory Visit: Payer: Self-pay | Admitting: Obstetrics and Gynecology

## 2022-06-23 ENCOUNTER — Other Ambulatory Visit (HOSPITAL_COMMUNITY): Payer: Self-pay

## 2022-06-23 DIAGNOSIS — N946 Dysmenorrhea, unspecified: Secondary | ICD-10-CM

## 2022-06-24 ENCOUNTER — Other Ambulatory Visit (HOSPITAL_COMMUNITY): Payer: Self-pay

## 2022-06-25 DIAGNOSIS — Z419 Encounter for procedure for purposes other than remedying health state, unspecified: Secondary | ICD-10-CM | POA: Diagnosis not present

## 2022-06-29 ENCOUNTER — Other Ambulatory Visit: Payer: Self-pay | Admitting: Obstetrics and Gynecology

## 2022-06-29 ENCOUNTER — Other Ambulatory Visit (HOSPITAL_COMMUNITY): Payer: Self-pay

## 2022-06-29 DIAGNOSIS — N946 Dysmenorrhea, unspecified: Secondary | ICD-10-CM

## 2022-06-30 ENCOUNTER — Other Ambulatory Visit (HOSPITAL_COMMUNITY): Payer: Self-pay

## 2022-07-20 ENCOUNTER — Other Ambulatory Visit (HOSPITAL_COMMUNITY): Payer: Self-pay

## 2022-07-20 ENCOUNTER — Other Ambulatory Visit: Payer: Self-pay | Admitting: Obstetrics and Gynecology

## 2022-07-20 DIAGNOSIS — N946 Dysmenorrhea, unspecified: Secondary | ICD-10-CM

## 2022-07-21 ENCOUNTER — Other Ambulatory Visit (HOSPITAL_COMMUNITY): Payer: Self-pay

## 2022-07-25 DIAGNOSIS — Z419 Encounter for procedure for purposes other than remedying health state, unspecified: Secondary | ICD-10-CM | POA: Diagnosis not present

## 2022-08-10 ENCOUNTER — Other Ambulatory Visit (HOSPITAL_COMMUNITY): Payer: Self-pay

## 2022-08-11 ENCOUNTER — Other Ambulatory Visit (HOSPITAL_COMMUNITY): Payer: Self-pay

## 2022-08-11 ENCOUNTER — Encounter: Payer: Self-pay | Admitting: Family Medicine

## 2022-08-11 ENCOUNTER — Ambulatory Visit (INDEPENDENT_AMBULATORY_CARE_PROVIDER_SITE_OTHER): Payer: Medicaid Other | Admitting: Family Medicine

## 2022-08-11 VITALS — BP 120/78 | HR 84 | Ht 65.0 in | Wt 272.2 lb

## 2022-08-11 DIAGNOSIS — R21 Rash and other nonspecific skin eruption: Secondary | ICD-10-CM | POA: Diagnosis not present

## 2022-08-11 MED ORDER — NYSTATIN-TRIAMCINOLONE 100000-0.1 UNIT/GM-% EX OINT
TOPICAL_OINTMENT | Freq: Two times a day (BID) | CUTANEOUS | 0 refills | Status: DC
  Filled 2022-08-11: qty 60, 30d supply, fill #0

## 2022-08-11 MED ORDER — VALACYCLOVIR HCL 1 G PO TABS
1000.0000 mg | ORAL_TABLET | Freq: Two times a day (BID) | ORAL | 9 refills | Status: DC
  Filled 2022-08-11: qty 14, 7d supply, fill #0

## 2022-08-11 NOTE — Progress Notes (Signed)
    CHIEF COMPLAINT / HPI: Several days of a burning sensation in the GU/rectal area.  Feels like there is some bumps.  Has had some burning on the outside skin when she urinates.   PERTINENT  PMH / PSH: I have reviewed the patient's medications, allergies, past medical and surgical history, smoking status and updated in the EMR as appropriate.   OBJECTIVE:  BP 120/78   Pulse 84   Ht 5\' 5"  (1.651 m)   Wt 272 lb 3.2 oz (123.5 kg)   LMP 07/09/2022 (Approximate)   SpO2 100%   BMI 45.30 kg/m  GU: No vaginal discharge.  On the buttock area there is some erythema with some excoriated macerated skin.  Looks like it could previously have been small vesicular area.  No lymph nodes in the groin.  ASSESSMENT / PLAN:   Rash of genital area Not 100% sure what this is but could be related to herpetic outbreak although she does not recall ever having one before.  With that in mind, we will treat empirically with Valtrex for initial outbreak and see her back next week.  We will also add topical barrier cream.  Dorcas Mcmurray MD

## 2022-08-11 NOTE — Assessment & Plan Note (Signed)
Not 100% sure what this is but could be related to herpetic outbreak although she does not recall ever having one before.  With that in mind, we will treat empirically with Valtrex for initial outbreak and see her back next week.  We will also add topical barrier cream.

## 2022-08-11 NOTE — Patient Instructions (Signed)
I have sent in a prescription for Valtrex in case this is related to her herpetic outbreak although I am not 100% sure that is what this is.  I think it is better to be safe than not.  He will take 1 tab twice a day for 7 days.  Additionally, I have sent in a prescription for some nystatin ointment that I want you to put on all the area of the rash in your buttock and GU area twice a day.  Let us see you back next week, please make that appointment before you leave.  If anything gets worse or have new symptoms, please give [place] a call.  Nice to meet you!

## 2022-08-18 NOTE — Patient Instructions (Incomplete)
It was great to see you! Thank you for allowing me to participate in your care!  I recommend that you always bring your medications to each appointment as this makes it easy to ensure we are on the correct medications and helps us not miss when refills are needed.  Our plans for today:  - *** -   We are checking some labs today, I will call you if they are abnormal will send you a MyChart message or a letter if they are normal.  If you do not hear about your labs in the next 2 weeks please let us know.***  Take care and seek immediate care sooner if you develop any concerns.   Dr. Jaqualin Serpa, MD [facility]  

## 2022-08-18 NOTE — Progress Notes (Deleted)
    SUBJECTIVE:   CHIEF COMPLAINT / HPI:   Seen on August 11, 2022 for genital rash, uncertain diagnosis at that time discussed empiric treatment with Valtrex, in addition of nystatin ointment.  Seen here today for follow-up.  PERTINENT  PMH / PSH: HSIL, UTI  OBJECTIVE:   LMP 07/09/2022 (Approximate)   ***  ASSESSMENT/PLAN:   Rash of genital area Today patient's rash has     Salvadore Oxford, MD [place]

## 2022-08-18 NOTE — Assessment & Plan Note (Deleted)
Today patient's rash has

## 2022-08-19 ENCOUNTER — Ambulatory Visit: Payer: Medicaid Other | Admitting: Family Medicine

## 2022-08-19 DIAGNOSIS — R21 Rash and other nonspecific skin eruption: Secondary | ICD-10-CM

## 2022-08-20 ENCOUNTER — Other Ambulatory Visit (HOSPITAL_COMMUNITY): Payer: Self-pay

## 2022-08-20 ENCOUNTER — Other Ambulatory Visit (HOSPITAL_COMMUNITY)
Admission: RE | Admit: 2022-08-20 | Discharge: 2022-08-20 | Disposition: A | Discharge status: Home / Self Care | Payer: Medicaid Other | Source: Ambulatory Visit | Attending: Family Medicine | Admitting: Family Medicine

## 2022-08-20 ENCOUNTER — Ambulatory Visit (INDEPENDENT_AMBULATORY_CARE_PROVIDER_SITE_OTHER): Payer: Medicaid Other | Admitting: Family Medicine

## 2022-08-20 VITALS — BP 115/79 | HR 66 | Ht 65.0 in | Wt 271.0 lb

## 2022-08-20 DIAGNOSIS — Z113 Encounter for screening for infections with a predominantly sexual mode of transmission: Secondary | ICD-10-CM

## 2022-08-20 DIAGNOSIS — R21 Rash and other nonspecific skin eruption: Secondary | ICD-10-CM

## 2022-08-20 LAB — POCT WET PREP (WET MOUNT)
Clue Cells Wet Prep Whiff POC: NEGATIVE
Trichomonas Wet Prep HPF POC: ABSENT

## 2022-08-20 MED ORDER — NYSTATIN 100000 UNIT/GM EX CREA
1.0000 | TOPICAL_CREAM | Freq: Four times a day (QID) | CUTANEOUS | 1 refills | Status: DC
  Filled 2022-08-20: qty 30, 14d supply, fill #0

## 2022-08-20 NOTE — Progress Notes (Signed)
    SUBJECTIVE:   CHIEF COMPLAINT / HPI:   Genital Rash follow-up - Was not able to afford the cream sent at the last visit - Had no improvement in her burning sensation with the valacyclovir treatment  - Still reports some burning sensation while using the restroom  STD testing - Patient sexually active and would like to be tested today  PERTINENT  PMH / PSH: Reviewed  OBJECTIVE:   BP 115/79   Pulse 66   Ht 5\' 5"  (1.651 m)   Wt 271 lb (122.9 kg)   LMP 08/14/2022 (Approximate)   SpO2 100%   BMI 45.10 kg/m   General: NAD, well-appearing, well-nourished Respiratory: No respiratory distress, breathing comfortably, able to speak in full sentences Skin: warm and dry, groin region and thighs with dermatologic changes consistent with possible yeast or intertrigo, no sores or open areas Psych: Appropriate affect and mood Pelvic exam: normal external genitalia, vulva, vagina, cervix, uterus and adnexa, WET MOUNT done - results: negative for pathogens, normal epithelial cells, exam chaperoned by Salvatore Marvel, CMA.  ASSESSMENT/PLAN:   Rash of genital area Unclear of exact rash, has had some mild improvement. Given that patient was unable to get the cream last prescribed, will trial Nystatin cream (on Medicaid formulary). - Nystatin cream prescribed - Patient to call back if unable to get prescription - Follow-up in 2 weeks if no improvement or still having burning sensation  STD testing Wet prep was negative.  - Gonorrhea/chlamydia testing sent - HIV and RPR per patient request    [name] [name], [place]

## 2022-08-20 NOTE — Patient Instructions (Addendum)
I am sending in a different cream that I am hoping will be covered by your insurance, please make sure to call the office and let us know if it is not covered so we can figure out another plan of treatment. We can do this treatment twice daily for the next 2 weeks or until the rash clears up.  If you are able to, I do recommend getting a zinc oxide ointment or cream that is over-the-counter.  If you are able to get it, I would first put on a layer of the prescription cream (nystatin) and then put a layer of the zinc oxide over it.  If no improvement in the next 2 weeks, make sure to come back to the office for evaluation again.

## 2022-08-21 LAB — HIV ANTIBODY (ROUTINE TESTING W REFLEX): HIV Screen 4th Generation wRfx: NONREACTIVE

## 2022-08-21 LAB — RPR: RPR Ser Ql: NONREACTIVE

## 2022-08-23 LAB — CERVICOVAGINAL ANCILLARY ONLY
Chlamydia: NEGATIVE
Comment: NEGATIVE
Comment: NEGATIVE
Comment: NORMAL
Neisseria Gonorrhea: NEGATIVE
Trichomonas: NEGATIVE

## 2022-08-25 DIAGNOSIS — Z419 Encounter for procedure for purposes other than remedying health state, unspecified: Secondary | ICD-10-CM | POA: Diagnosis not present

## 2022-08-30 ENCOUNTER — Other Ambulatory Visit (HOSPITAL_COMMUNITY): Payer: Self-pay

## 2022-09-24 DIAGNOSIS — Z419 Encounter for procedure for purposes other than remedying health state, unspecified: Secondary | ICD-10-CM | POA: Diagnosis not present

## 2022-10-25 DIAGNOSIS — Z419 Encounter for procedure for purposes other than remedying health state, unspecified: Secondary | ICD-10-CM | POA: Diagnosis not present

## 2022-11-25 DIAGNOSIS — Z419 Encounter for procedure for purposes other than remedying health state, unspecified: Secondary | ICD-10-CM | POA: Diagnosis not present

## 2022-12-24 DIAGNOSIS — Z419 Encounter for procedure for purposes other than remedying health state, unspecified: Secondary | ICD-10-CM | POA: Diagnosis not present

## 2023-01-24 DIAGNOSIS — Z419 Encounter for procedure for purposes other than remedying health state, unspecified: Secondary | ICD-10-CM | POA: Diagnosis not present

## 2023-02-23 DIAGNOSIS — Z419 Encounter for procedure for purposes other than remedying health state, unspecified: Secondary | ICD-10-CM | POA: Diagnosis not present

## 2023-03-26 DIAGNOSIS — Z419 Encounter for procedure for purposes other than remedying health state, unspecified: Secondary | ICD-10-CM | POA: Diagnosis not present

## 2023-04-25 DIAGNOSIS — Z419 Encounter for procedure for purposes other than remedying health state, unspecified: Secondary | ICD-10-CM | POA: Diagnosis not present

## 2023-05-26 DIAGNOSIS — Z419 Encounter for procedure for purposes other than remedying health state, unspecified: Secondary | ICD-10-CM | POA: Diagnosis not present

## 2023-06-26 DIAGNOSIS — Z419 Encounter for procedure for purposes other than remedying health state, unspecified: Secondary | ICD-10-CM | POA: Diagnosis not present

## 2023-07-26 DIAGNOSIS — Z419 Encounter for procedure for purposes other than remedying health state, unspecified: Secondary | ICD-10-CM | POA: Diagnosis not present

## 2023-08-26 DIAGNOSIS — Z419 Encounter for procedure for purposes other than remedying health state, unspecified: Secondary | ICD-10-CM | POA: Diagnosis not present

## 2023-09-25 DIAGNOSIS — Z419 Encounter for procedure for purposes other than remedying health state, unspecified: Secondary | ICD-10-CM | POA: Diagnosis not present

## 2023-10-26 DIAGNOSIS — Z419 Encounter for procedure for purposes other than remedying health state, unspecified: Secondary | ICD-10-CM | POA: Diagnosis not present

## 2023-11-26 DIAGNOSIS — Z419 Encounter for procedure for purposes other than remedying health state, unspecified: Secondary | ICD-10-CM | POA: Diagnosis not present

## 2023-12-24 DIAGNOSIS — Z419 Encounter for procedure for purposes other than remedying health state, unspecified: Secondary | ICD-10-CM | POA: Diagnosis not present

## 2024-01-09 ENCOUNTER — Ambulatory Visit

## 2024-02-04 DIAGNOSIS — Z419 Encounter for procedure for purposes other than remedying health state, unspecified: Secondary | ICD-10-CM | POA: Diagnosis not present

## 2024-02-23 ENCOUNTER — Ambulatory Visit (INDEPENDENT_AMBULATORY_CARE_PROVIDER_SITE_OTHER): Admitting: Family Medicine

## 2024-02-23 ENCOUNTER — Encounter: Payer: Self-pay | Admitting: Family Medicine

## 2024-02-23 VITALS — BP 112/68 | HR 72 | Ht 65.0 in | Wt 245.6 lb

## 2024-02-23 DIAGNOSIS — R1084 Generalized abdominal pain: Secondary | ICD-10-CM

## 2024-02-23 NOTE — Progress Notes (Signed)
    SUBJECTIVE:   CHIEF COMPLAINT / HPI: stomach pain  Chest tenderness/pain  Abdominal Pain Off and on for the past month. Central pain that radiates to her R shoulder at times, but also over her stomach. Mildly sharp, but comes and goes quickly. Grandmother with hx of heart attack in her 63s. No true correlation to eating, but sometimes feels like heartburn. Currently hurts to touch, but not otherwise. Endorses trying Famotidine , but it didn't really help, only tried it once this week and has been chewing gum to help. Does eat spicy/acidic foods and describes laying down quickly after meals.   Also has lower abdominal pain that she affiliates with her menstrual cycles, but occurs at other times of the month as well.  Denies alcohol , drugs, and tobacco use.  PERTINENT  PMH / PSH: acid reflux, anxiety  OBJECTIVE:   BP 112/68   Pulse 72   Ht 5\' 5"  (1.651 m)   Wt 245 lb 9.6 oz (111.4 kg)   SpO2 100%   BMI 40.87 kg/m   General: Awake and Alert in NAD HEENT: NCAT. Sclera anicteric. No rhinorrhea. Cardiovascular: RRR. No M/R/G. Substernal TTP Respiratory: CTAB, normal WOB on RA. No wheezing, crackles, rhonchi, or diminished breath sounds. Abdomen: Soft, non-tender, non-distended. Bowel sounds normoactive Extremities: Able to move all extremities. No BLE edema, no deformities or significant joint findings. Skin: Warm and dry. No abrasions or rashes noted. Neuro: No focal neurological deficits.  ASSESSMENT/PLAN:   Assessment & Plan Generalized abdominal pain Vague, diffuse abd pain (over stomach and b/l lower abd) with substernal chest tenderness, likely related to GERD which she has a history of. - H pylori breath test, will treat as indicated - Advised lifestyle modifications for heartburn  - Follow up sooner if symptoms worsen, but otherwise will follow up if H pylori test is positive   Clyda Dark, DO [facility]

## 2024-02-23 NOTE — Patient Instructions (Addendum)
 It was great to see you today! Thank you for choosing [facility] for your primary care. [name] [name] was seen for abdominal pain.  Today we addressed: Abdominal Pain Lifestyle modifications for heart burn: eating smaller, more frequent meals, avoiding spicy/acidic foods, avoid chocolate, and caffeine, don't lie down after eating, wait at least 2-3 hours, elevate your head in bed, maintain a healthy weight.  H pylori breath test today and treat accordingly  We are checking some labs today. I will send you a MyChart message with your results, per your preference. If you do not hear about your labs in the next 2 weeks, please call the office.  If you haven't already, sign up for My Chart to have easy access to your labs results, and communication with your primary care physician.  You should return to our clinic No follow-ups on file. Please arrive 15 minutes before your appointment to ensure smooth check in process.  We appreciate your efforts in making this happen.  Thank you for allowing me to participate in your care, Clyda Dark, DO 02/23/2024, 2:22 PM PGY-1, [facility]

## 2024-02-29 ENCOUNTER — Encounter: Payer: Self-pay | Admitting: Family Medicine

## 2024-02-29 LAB — H. PYLORI BREATH TEST: H pylori Breath Test: NEGATIVE

## 2024-03-05 DIAGNOSIS — Z419 Encounter for procedure for purposes other than remedying health state, unspecified: Secondary | ICD-10-CM | POA: Diagnosis not present

## 2024-04-03 ENCOUNTER — Encounter: Payer: Self-pay | Admitting: *Deleted

## 2024-04-05 DIAGNOSIS — Z419 Encounter for procedure for purposes other than remedying health state, unspecified: Secondary | ICD-10-CM | POA: Diagnosis not present

## 2024-04-12 ENCOUNTER — Emergency Department (HOSPITAL_BASED_OUTPATIENT_CLINIC_OR_DEPARTMENT_OTHER)
Admission: EM | Admit: 2024-04-12 | Discharge: 2024-04-12 | Disposition: A | Discharge status: Home / Self Care | Attending: Emergency Medicine | Admitting: Emergency Medicine

## 2024-04-12 ENCOUNTER — Other Ambulatory Visit (HOSPITAL_BASED_OUTPATIENT_CLINIC_OR_DEPARTMENT_OTHER): Payer: Self-pay

## 2024-04-12 ENCOUNTER — Encounter (HOSPITAL_BASED_OUTPATIENT_CLINIC_OR_DEPARTMENT_OTHER): Payer: Self-pay

## 2024-04-12 ENCOUNTER — Other Ambulatory Visit: Payer: Self-pay

## 2024-04-12 DIAGNOSIS — Z3202 Encounter for pregnancy test, result negative: Secondary | ICD-10-CM | POA: Insufficient documentation

## 2024-04-12 DIAGNOSIS — R519 Headache, unspecified: Secondary | ICD-10-CM | POA: Insufficient documentation

## 2024-04-12 DIAGNOSIS — D473 Essential (hemorrhagic) thrombocythemia: Secondary | ICD-10-CM | POA: Diagnosis not present

## 2024-04-12 DIAGNOSIS — R1032 Left lower quadrant pain: Secondary | ICD-10-CM | POA: Diagnosis not present

## 2024-04-12 DIAGNOSIS — D649 Anemia, unspecified: Secondary | ICD-10-CM | POA: Diagnosis not present

## 2024-04-12 LAB — CBC
HCT: 28.9 % — ABNORMAL LOW (ref 36.0–46.0)
Hemoglobin: 8.8 g/dL — ABNORMAL LOW (ref 12.0–15.0)
MCH: 24.4 pg — ABNORMAL LOW (ref 26.0–34.0)
MCHC: 30.4 g/dL (ref 30.0–36.0)
MCV: 80.3 fL (ref 80.0–100.0)
Platelets: 524 10*3/uL — ABNORMAL HIGH (ref 150–400)
RBC: 3.6 MIL/uL — ABNORMAL LOW (ref 3.87–5.11)
RDW: 16.1 % — ABNORMAL HIGH (ref 11.5–15.5)
WBC: 6.6 10*3/uL (ref 4.0–10.5)
nRBC: 0 % (ref 0.0–0.2)

## 2024-04-12 LAB — URINALYSIS, ROUTINE W REFLEX MICROSCOPIC
Bilirubin Urine: NEGATIVE
Glucose, UA: NEGATIVE mg/dL
Hgb urine dipstick: NEGATIVE
Ketones, ur: NEGATIVE mg/dL
Leukocytes,Ua: NEGATIVE
Nitrite: NEGATIVE
Protein, ur: NEGATIVE mg/dL
Specific Gravity, Urine: 1.011 (ref 1.005–1.030)
pH: 6.5 (ref 5.0–8.0)

## 2024-04-12 LAB — BASIC METABOLIC PANEL WITH GFR
Anion gap: 9 (ref 5–15)
BUN: <5 mg/dL — ABNORMAL LOW (ref 6–20)
CO2: 23 mmol/L (ref 22–32)
Calcium: 9 mg/dL (ref 8.9–10.3)
Chloride: 105 mmol/L (ref 98–111)
Creatinine, Ser: 0.66 mg/dL (ref 0.44–1.00)
GFR, Estimated: >60 mL/min (ref >60)
Glucose, Bld: 101 mg/dL — ABNORMAL HIGH (ref 70–99)
Potassium: 3.8 mmol/L (ref 3.5–5.1)
Sodium: 137 mmol/L (ref 135–145)

## 2024-04-12 LAB — PREGNANCY, URINE: Preg Test, Ur: NEGATIVE

## 2024-04-12 LAB — RESP PANEL BY RT-PCR (RSV, FLU A&B, COVID)  RVPGX2
Influenza A by PCR: NEGATIVE
Influenza B by PCR: NEGATIVE
Resp Syncytial Virus by PCR: NEGATIVE
SARS Coronavirus 2 by RT PCR: NEGATIVE

## 2024-04-12 MED ORDER — KETOROLAC TROMETHAMINE 15 MG/ML IJ SOLN
15.0000 mg | Freq: Once | INTRAMUSCULAR | Status: AC
  Administered 2024-04-12: 15 mg via INTRAVENOUS
  Filled 2024-04-12: qty 1

## 2024-04-12 MED ORDER — CELECOXIB 200 MG PO CAPS
200.0000 mg | ORAL_CAPSULE | Freq: Two times a day (BID) | ORAL | 0 refills | Status: AC | PRN
  Filled 2024-04-12: qty 30, 15d supply, fill #0

## 2024-04-12 MED ORDER — DIPHENHYDRAMINE HCL 50 MG/ML IJ SOLN
25.0000 mg | Freq: Once | INTRAMUSCULAR | Status: DC
  Filled 2024-04-12: qty 1

## 2024-04-12 MED ORDER — METOCLOPRAMIDE HCL 5 MG/ML IJ SOLN
10.0000 mg | Freq: Once | INTRAMUSCULAR | Status: DC
  Filled 2024-04-12: qty 2

## 2024-04-12 NOTE — Discharge Instructions (Addendum)
 As discussed, your labs did show evidence of anemia with hemoglobin 8.8.  Will send in anti-inflammatories to treat pain.  If you change any marketing or emergent imaging, recommend going to [facility], [facility], [employer] or other [facility] for imaging study.

## 2024-04-12 NOTE — ED Provider Notes (Signed)
  EMERGENCY DEPARTMENT AT [facility] Provider Note   CSN: [id number] Arrival date & time: 04/12/24  1242     Patient presents with: Headache   [name] [name] is a 33 y.o. female.    Headache   33 year old female presents to the emergency department for a few different complaints.  Complaining of headache described as in forehead region with some radiation down the back of her neck.  States that the symptoms have been intermittent for the last week or so.  Denies any vision disturbance, gait or mental, slurred speech, facial droop, weakness or sensory deficits in upper extremities.  Has been taking Tylenol  for headache which has not been helping her headache very much.  Presents emergency department for further assessment.  Also complaining of painful urinating as well as left-sided flank pain.  States that this is also been present for the past week or so and has been constant.  Denies any fevers, chills, hematuria, urinary frequency, vaginal symptoms, change in bowel habits.  Last bowel movement this morning and regular per patient.  Past medical history significant for anxiety, acid reflux,  Prior to Admission medications   Not on File    Allergies: Patient has no known allergies.    Review of Systems  Neurological:  Positive for headaches.  All other systems reviewed and are negative.   Updated Vital Signs BP 121/78 (BP Location: Left Arm)   Pulse (!) 59   Temp 98.9 F (37.2 C) (Oral)   Resp 20   LMP 03/09/2024 (Approximate)   SpO2 100%   Physical Exam Vitals and nursing note reviewed.  Constitutional:      General: She is not in acute distress.    Appearance: She is well-developed.  HENT:     Head: Normocephalic and atraumatic.   Eyes:     Conjunctiva/sclera: Conjunctivae normal.    Cardiovascular:     Rate and Rhythm: Normal rate and regular rhythm.     Heart sounds: No murmur heard. Pulmonary:     Effort: Pulmonary effort is normal. No  respiratory distress.     Breath sounds: Normal breath sounds.  Abdominal:     General: There is no distension.     Palpations: Abdomen is soft. There is no mass.     Comments: Suprapubic tenderness, left lower quadrant tenderness.   Musculoskeletal:        General: No swelling.     Cervical back: Neck supple.   Skin:    General: Skin is warm and dry.     Capillary Refill: Capillary refill takes less than 2 seconds.   Neurological:     Mental Status: She is alert.   Psychiatric:        Mood and Affect: Mood normal.    (all labs ordered are listed, but only abnormal results are displayed) Labs Reviewed  CBC - Abnormal; Notable for the following components:      Result Value   RBC 3.60 (*)    Hemoglobin 8.8 (*)    HCT 28.9 (*)    MCH 24.4 (*)    RDW 16.1 (*)    Platelets 524 (*)    All other components within normal limits  BASIC METABOLIC PANEL WITH GFR - Abnormal; Notable for the following components:   Glucose, Bld 101 (*)    BUN <5 (*)    All other components within normal limits  RESP PANEL BY RT-PCR (RSV, FLU A&B, COVID)  RVPGX2  PREGNANCY, URINE  URINALYSIS, ROUTINE  W REFLEX MICROSCOPIC    EKG: None  Radiology: No results found.   Procedures   Medications Ordered in the ED  metoCLOPramide (REGLAN) injection 10 mg (has no administration in time range)  diphenhydrAMINE  (BENADRYL ) injection 25 mg (has no administration in time range)                                    Medical Decision Making Amount and/or Complexity of Data Reviewed Labs: ordered. Radiology: ordered.  Risk Prescription drug management.   This patient presents to the ED for concern of headache, abdominal pain, this involves an extensive number of treatment options, and is a complaint that carries with it a high risk of complications and morbidity.  The differential diagnosis includes CVA, migraine/tension/cluster headache, cerebral venous thrombosis,, meningitis, encephalitis,  diverticulitis, appendicitis, pyelonephritis, nephrolithiasis, cystitis, ectopic pregnancy, tubo-ovarian abscess, ovarian torsion, other   Co morbidities that complicate the patient evaluation  See HPI   Additional history obtained:  Additional history obtained from EMR External records from outside source obtained and reviewed including hospital records   Lab Tests:  I Ordered, and personally interpreted labs.  The pertinent results include: No leukocytosis.  Patient with anemia hemoglobin of 8.8 which is normocytic in nature; unable to see hemoglobin from 3 years ago when it was in the low tens..  Thrombocytosis of 524.  UA without abnormality.  Urine pregnancy negative.  Viral testing negative. Transaminitis.  No renal dysfunction.   Imaging Studies ordered:  Ordered CT scan but patient desired to leave prior to CT scan resulting.   Cardiac Monitoring: / EKG:  The patient was maintained on a cardiac monitor.  I personally viewed and interpreted the cardiac monitored which showed an underlying rhythm of: sinus rhythm   Consultations Obtained:  N/a   Problem List / ED Course / Critical interventions / Medication management  Lower abdominal pain, headache I ordered medication including Benadryl , Reglan   Reevaluation of the patient after these medicines showed that the patient improved I have reviewed the patients home medicines and have made adjustments as needed   Social Determinants of Health:  Denies tobacco, licit drug use.   Test / Admission - Considered:  Lower abdominal pain, headache Vitals signs within normal range and stable throughout visit. Laboratory/imaging studies significant for: See above 33 year old female presents to the emergency department for a few different complaints.  Complaining of headache described as in forehead region with some radiation down the back of her neck.  States that the symptoms have been intermittent for the last week or  so.  Denies any vision disturbance, gait or mental, slurred speech, facial droop, weakness or sensory deficits in upper extremities.  Has been taking Tylenol  for headache which has not been helping her headache very much.  Presents emergency department for further assessment.  Also complaining of painful urinating as well as left-sided flank pain.  States that this is also been present for the past week or so and has been constant.  Denies any fevers, chills, hematuria, urinary frequency, vaginal symptoms, change in bowel habits.  Last bowel movement this morning and regular per patient. On exam, nonfocal neurologic exam.  Patient with some tenderness to the pubic/left lower quadrant of abdomen.  Laboratory studies obtained significant for anemia hemoglobin of 8.8 which patient has had hemoglobins of 9.2 up to 11 in the past; normocytic in nature.  Patient does report heavier menstrual  cycles which is most likely source but currently not on menstrual cycle.  Patient deferred Hemoccult.  Will recommend iron supplementations and follow-up with primary care.  Otherwise, labs unremarkable for any acute process.  Given patient's lower abdominal tenderness as well as urinary symptoms, decided was to obtain CT imaging of patient's abdomen.  CT scanner temporarily became unavailable due to maintenance work and after 4 hours being in the emergency department, patient elected to leave prior to scan being performed or being transferred to another facility for imaging..  I discussed increased risk of morbidity/potential mortality of incomplete workup and patient knowledge understanding and still desired to leave.  Given that patient symptoms been present for the past week without any acute change with reassuring labs, will discharge and have her follow-up with primary care or return to the emergency department if her mind changes about further workup.      Final diagnoses:  None    ED Discharge Orders     None           [place], [place] 04/12/24 1814    Lind Repine, MD 04/12/24 2302

## 2024-04-12 NOTE — ED Triage Notes (Signed)
 She c/o headache plus polymyalgias x 2 days. She does not know whether she has had a fever or not.

## 2024-05-05 DIAGNOSIS — Z419 Encounter for procedure for purposes other than remedying health state, unspecified: Secondary | ICD-10-CM | POA: Diagnosis not present

## 2024-06-05 DIAGNOSIS — Z419 Encounter for procedure for purposes other than remedying health state, unspecified: Secondary | ICD-10-CM | POA: Diagnosis not present

## 2024-07-06 DIAGNOSIS — Z419 Encounter for procedure for purposes other than remedying health state, unspecified: Secondary | ICD-10-CM | POA: Diagnosis not present

## 2024-07-26 ENCOUNTER — Other Ambulatory Visit (HOSPITAL_COMMUNITY): Payer: Self-pay

## 2024-07-26 ENCOUNTER — Ambulatory Visit (HOSPITAL_COMMUNITY)
Admission: RE | Admit: 2024-07-26 | Discharge: 2024-07-26 | Disposition: A | Discharge status: Home / Self Care | Source: Ambulatory Visit | Attending: Family Medicine | Admitting: Family Medicine

## 2024-07-26 ENCOUNTER — Encounter: Payer: Self-pay | Admitting: Family Medicine

## 2024-07-26 ENCOUNTER — Ambulatory Visit: Admitting: Family Medicine

## 2024-07-26 VITALS — BP 110/80 | HR 84 | Ht 65.0 in | Wt 256.4 lb

## 2024-07-26 DIAGNOSIS — R079 Chest pain, unspecified: Secondary | ICD-10-CM | POA: Insufficient documentation

## 2024-07-26 DIAGNOSIS — H6123 Impacted cerumen, bilateral: Secondary | ICD-10-CM | POA: Diagnosis not present

## 2024-07-26 DIAGNOSIS — R1030 Lower abdominal pain, unspecified: Secondary | ICD-10-CM | POA: Diagnosis not present

## 2024-07-26 MED ORDER — FT EARWAX REMOVAL 6.5 % OT SOLN
5.0000 [drp] | Freq: Two times a day (BID) | OTIC | 0 refills | Status: AC
  Filled 2024-07-26 – 2024-07-28 (×2): qty 15, 30d supply, fill #0

## 2024-07-26 NOTE — Patient Instructions (Signed)
 It was wonderful to see you today.  Please bring ALL of your medications with you to every visit.   Today we talked about:  Chest pain - I am concerned regarding your chest pain it is difficult to rule out a blood clot in the veins to your lungs just in clinic. Since you have declined to go to the emergency department we will do some labs and try to order an outpatient CT of your lungs. Please follow up tomorrow to follow up your chest pain.   For your pelvic/ abdominal pain this could be related to many different things. It could be due to a long term STI in your genitourinary system or fibroids. At your follow up they can consider testing for STI and maybe ordering a pelvic ultrasound.   For your ear fullness you have very very full ear wax. It is difficult to evaluate your ear due to all the wax. I have prescribed ear drops for you. Please do this for a couple days and then we can follow up your ear pain.   Please follow up tomorrow. Make another appointment in 4-5 days to check on your ears.   Thank you for choosing [facility].   Please call [phone] with any questions about today's appointment.  Please be sure to schedule follow up at the front desk before you leave today.   Areta Saliva, MD  Family Medicine

## 2024-07-26 NOTE — Progress Notes (Unsigned)
 SUBJECTIVE:   CHIEF COMPLAINT / HPI:   Chest pain  Patient has been having chest pain for one week. Feels like a sharp stretching pain across her whole chest. She says it comes and goes throughout the day. She notices it when she walks or moves around and laying down but activity is worse. When it happens she has trouble breathing. When she has the chest pain it goes down her right arm.  Currently, hurts at this time. 7/10.  Has also been having headaches but no fevers. Denies light headedness with pain. This is new for her.  Has been trying tylenol  for this but has not helped.  Does not feel like this is similar to heartburn that she has had in the past.   Ear Pain  Patient reports ear pain in her right ear that radiates down her neck for the last week. Denies fever or other URI symptoms. Says she has had many ear infections in the past. From chart review patient has had impacted cerumen frequently causing otalgia as well. No difficulty hearing, no tinnitus, no dizziness.   Lower abdominal pain  Patient reports lower abdominal pain that she says feels like pulling from both sides of her lower abdomen. Says It feels like it is attached to her uterus. Usually happens with periods. Patient reports regular periods. Says they are very painful. Sexually active does not always use protection. However, patient has not been sexually active for the last 6 months. Not on birth control 2 partners in the last year.   2023 GC/CT, trichomonas testing negative. Has not had a history of fibroids or endometriosis that she knows of.  No nausea, vomiting, diarrhea. She has daily bowel movements.    PERTINENT  PMH / PSH: H/o dysmenorrhea   OBJECTIVE:   BP 110/80   Pulse 84   Ht 5' 5 (1.651 m)   Wt 256 lb 6.4 oz (116.3 kg)   SpO2 97%   BMI 42.67 kg/m   General: in no acute distress  HEENT: bilateral ear canals very impacted with cerumen  CV: RRR, No m/r/g, cap refill < 2 seconds  Resp: No  increased work of breathing on room air, CTAB  Abd: soft, non distended, tender to palpation of epigastrium and across the lower abdomen, no guarding  Ext: no lower extremity edema, erythema, or pain   EKG: ventricular rate 55, normal pr and qtc intervals, no ST changes   ASSESSMENT/PLAN:   Assessment & Plan Chest pain, unspecified type Patient is low risk for ACS or CAD; however, her worsening chest pain with exertion and with dyspnea is concerning for possible PE. Wells score 3. Patient has stable vitals at this time and unremarkable EKG, but given her symptoms recommended patient seek emergent attention at the ED for further evaluation.  Patient declined after being counseled on the potential risks.  - Made follow up appointment for patient in clinic tomorrow morning  - Gave patient ED precautions for worsening chest pian, shortness of breath, light headedness  - D-dimer, CTA, CBC, CMP ordered  Impacted cerumen of both ears Patient's otalgia most likely related to impacted cerumen; however, difficult to evaluate her TM further due to cerumen. Advised patient to stop using hydrogen peroxide.  - Recommended use debrox for one week and follow up.  Lower abdominal pain Patient does not have acute abdomen on exam and vital signs stable at this time. Patient declined upreg test given she has not had sexual intercourse in 89  months. Lower abdominal pain could be PID, endometriosis, or fibroids. Unable to delve deeper into this problem during this appointment due to acute nature of chest pain. Unlikely appendicitis or diverticulitis given symptoms.  - Gave patient return precautions if pain worsens and patient has follow up tomorrow during which can do STI testing and order pelvic ultrasound if required.      [name] Saliva, MD [facility]

## 2024-07-27 ENCOUNTER — Emergency Department (HOSPITAL_COMMUNITY)
Admission: EM | Admit: 2024-07-27 | Discharge: 2024-07-28 | Disposition: A | Discharge status: Home / Self Care | Attending: Emergency Medicine | Admitting: Emergency Medicine

## 2024-07-27 ENCOUNTER — Telehealth: Payer: Self-pay | Admitting: Family Medicine

## 2024-07-27 ENCOUNTER — Emergency Department (HOSPITAL_COMMUNITY)

## 2024-07-27 ENCOUNTER — Other Ambulatory Visit: Payer: Self-pay

## 2024-07-27 ENCOUNTER — Telehealth: Payer: Self-pay

## 2024-07-27 ENCOUNTER — Encounter (HOSPITAL_COMMUNITY): Payer: Self-pay | Admitting: *Deleted

## 2024-07-27 ENCOUNTER — Ambulatory Visit: Payer: Self-pay

## 2024-07-27 DIAGNOSIS — R0602 Shortness of breath: Secondary | ICD-10-CM | POA: Diagnosis not present

## 2024-07-27 DIAGNOSIS — R079 Chest pain, unspecified: Secondary | ICD-10-CM | POA: Diagnosis not present

## 2024-07-27 DIAGNOSIS — M25511 Pain in right shoulder: Secondary | ICD-10-CM | POA: Diagnosis not present

## 2024-07-27 DIAGNOSIS — R0789 Other chest pain: Secondary | ICD-10-CM | POA: Diagnosis not present

## 2024-07-27 LAB — I-STAT CHEM 8, ED
BUN: 5 mg/dL — ABNORMAL LOW (ref 6–20)
Calcium, Ion: 1.13 mmol/L — ABNORMAL LOW (ref 1.15–1.40)
Chloride: 106 mmol/L (ref 98–111)
Creatinine, Ser: 0.7 mg/dL (ref 0.44–1.00)
Glucose, Bld: 103 mg/dL — ABNORMAL HIGH (ref 70–99)
HCT: 32 % — ABNORMAL LOW (ref 36.0–46.0)
Hemoglobin: 10.9 g/dL — ABNORMAL LOW (ref 12.0–15.0)
Potassium: 3.9 mmol/L (ref 3.5–5.1)
Sodium: 139 mmol/L (ref 135–145)
TCO2: 22 mmol/L (ref 22–32)

## 2024-07-27 LAB — CBC WITH DIFFERENTIAL/PLATELET
Basophils Absolute: 0 x10E3/uL (ref 0.0–0.2)
Basos: 0 %
EOS (ABSOLUTE): 0 x10E3/uL (ref 0.0–0.4)
Eos: 0 %
Hematocrit: 33.6 % — ABNORMAL LOW (ref 34.0–46.6)
Hemoglobin: 10.1 g/dL — ABNORMAL LOW (ref 11.1–15.9)
Immature Grans (Abs): 0 x10E3/uL (ref 0.0–0.1)
Immature Granulocytes: 0 %
Lymphocytes Absolute: 2.6 x10E3/uL (ref 0.7–3.1)
Lymphs: 39 %
MCH: 24.6 pg — ABNORMAL LOW (ref 26.6–33.0)
MCHC: 30.1 g/dL — ABNORMAL LOW (ref 31.5–35.7)
MCV: 82 fL (ref 79–97)
Monocytes Absolute: 0.5 x10E3/uL (ref 0.1–0.9)
Monocytes: 7 %
Neutrophils Absolute: 3.5 x10E3/uL (ref 1.4–7.0)
Neutrophils: 54 %
Platelets: 585 x10E3/uL — ABNORMAL HIGH (ref 150–450)
RBC: 4.1 x10E6/uL (ref 3.77–5.28)
RDW: 15.6 % — ABNORMAL HIGH (ref 11.7–15.4)
WBC: 6.6 x10E3/uL (ref 3.4–10.8)

## 2024-07-27 LAB — COMPREHENSIVE METABOLIC PANEL WITH GFR
ALT: 11 IU/L (ref 0–32)
AST: 16 IU/L (ref 0–40)
Albumin: 3.9 g/dL (ref 3.9–4.9)
Alkaline Phosphatase: 94 IU/L (ref 41–116)
BUN/Creatinine Ratio: 7 — ABNORMAL LOW (ref 9–23)
BUN: 5 mg/dL — ABNORMAL LOW (ref 6–20)
Bilirubin Total: <0.2 mg/dL (ref 0.0–1.2)
CO2: 23 mmol/L (ref 20–29)
Calcium: 9.5 mg/dL (ref 8.7–10.2)
Chloride: 103 mmol/L (ref 96–106)
Creatinine, Ser: 0.74 mg/dL (ref 0.57–1.00)
Globulin, Total: 3.1 g/dL (ref 1.5–4.5)
Glucose: 83 mg/dL (ref 70–99)
Potassium: 4.7 mmol/L (ref 3.5–5.2)
Sodium: 137 mmol/L (ref 134–144)
Total Protein: 7 g/dL (ref 6.0–8.5)
eGFR: 110 mL/min/1.73 (ref >59)

## 2024-07-27 LAB — TROPONIN I (HIGH SENSITIVITY): Troponin I (High Sensitivity): <2 ng/L (ref <18)

## 2024-07-27 LAB — CBC
HCT: 32.6 % — ABNORMAL LOW (ref 36.0–46.0)
Hemoglobin: 9.8 g/dL — ABNORMAL LOW (ref 12.0–15.0)
MCH: 24.1 pg — ABNORMAL LOW (ref 26.0–34.0)
MCHC: 30.1 g/dL (ref 30.0–36.0)
MCV: 80.3 fL (ref 80.0–100.0)
Platelets: 465 K/uL — ABNORMAL HIGH (ref 150–400)
RBC: 4.06 MIL/uL (ref 3.87–5.11)
RDW: 16.2 % — ABNORMAL HIGH (ref 11.5–15.5)
WBC: 7.1 K/uL (ref 4.0–10.5)
nRBC: 0 % (ref 0.0–0.2)

## 2024-07-27 LAB — BASIC METABOLIC PANEL WITH GFR
Anion gap: 8 (ref 5–15)
BUN: <5 mg/dL — ABNORMAL LOW (ref 6–20)
CO2: 22 mmol/L (ref 22–32)
Calcium: 8.8 mg/dL — ABNORMAL LOW (ref 8.9–10.3)
Chloride: 107 mmol/L (ref 98–111)
Creatinine, Ser: 0.69 mg/dL (ref 0.44–1.00)
GFR, Estimated: >60 mL/min (ref >60)
Glucose, Bld: 108 mg/dL — ABNORMAL HIGH (ref 70–99)
Potassium: 3.8 mmol/L (ref 3.5–5.1)
Sodium: 137 mmol/L (ref 135–145)

## 2024-07-27 LAB — D-DIMER, QUANTITATIVE: D-DIMER: 0.65 mg{FEU}/L — ABNORMAL HIGH (ref 0.00–0.49)

## 2024-07-27 MED ORDER — IOHEXOL 350 MG/ML SOLN
75.0000 mL | Freq: Once | INTRAVENOUS | Status: AC | PRN
  Administered 2024-07-27: 75 mL via INTRAVENOUS

## 2024-07-27 MED ORDER — IPRATROPIUM-ALBUTEROL 0.5-2.5 (3) MG/3ML IN SOLN
3.0000 mL | Freq: Once | RESPIRATORY_TRACT | Status: AC
  Administered 2024-07-28: 3 mL via RESPIRATORY_TRACT
  Filled 2024-07-27: qty 3

## 2024-07-27 NOTE — ED Triage Notes (Signed)
 Pt reports pain in the left arm and in her central and upper chest area for about a week. Denies swelling. Shortness of breath with exertion. Dry cough. (Seen at pcp, d dimer 0.65), sent for further eval.

## 2024-07-27 NOTE — ED Triage Notes (Addendum)
 Patient reports SHOB and CP x 1 week, d-dimer from PCP was slightly elevated so she came here for eval. Patient ambulatory with no distress to registration. Patient endorses activity making it worse.

## 2024-07-27 NOTE — Telephone Encounter (Signed)
 Called patient upon seeing her elevated d-dimer. Patient says that she is continuing to have chest pain and shortness of breath.  She also has the pain that goes down her right arm.  I advised patient to go to the emergency room given her elevated D-dimer and her continued chest pain and shortness of breath.  I informed her that though I ordered the CTA this would definitely not happen over the weekend given to scheduling difficulties.  I also informed patient of low hemoglobin.  Patient says that she has history of anemia and was supposed to be on iron.  She says that she does not take iron anymore.  I informed her that we would have to recheck this.  Patient says that she has heavy menstrual periods and this is probably why she has anemia. Patient says that she would go to the ED today.

## 2024-07-27 NOTE — ED Provider Notes (Signed)
 [facility]-EMERGENCY DEPT [facility] Emergency Department Provider Note MRN:  [id number]  Arrival date & time: 07/28/24     Chief Complaint   Chest Pain and Shortness of Breath   History of Present Illness   [name] [name] is a 33 y.o. year-old female presents to the ED with chief complaint of chest pain and left arm pain fro about a week.  Has had some exertional SOB.  Denies asthma.  Denies smoking, vaping, marijuana use.  She denies fever, chills, cough, or sore throat.  States that she was seen by her PCP and blood work done and had an elevated D-dimer and was sent to the ED for CT PE.  Denies any leg swelling.  Denies hx of PE/DVT, recent surgery, travel, or immobilization.    History provided by patient.   Review of Systems  Pertinent positive and negative review of systems noted in HPI.    Physical Exam   Vitals:   07/28/24 0008 07/28/24 0024  BP:  97/62  Pulse:  63  Resp:  18  Temp:  98.6 F (37 C)  SpO2: 100% 100%    CONSTITUTIONAL:  non toxic-appearing, NAD NEURO:  Alert and oriented x 3, CN 3-12 grossly intact EYES:  eyes equal and reactive ENT/NECK:  Supple, no stridor  CARDIO:  normal rate, regular rhythm, appears well-perfused, no chest wall pain PULM:  No respiratory distress, CTAB GI/GU:  non-distended,  MSK/SPINE:  No gross deformities, no edema, moves all extremities, no calf tenderness SKIN:  no rash, atraumatic   *Additional and/or pertinent findings included in MDM below  Diagnostic and Interventional Summary    EKG Interpretation Date/Time:    Ventricular Rate:    PR Interval:    QRS Duration:    QT Interval:    QTC Calculation:   R Axis:      Text Interpretation:         Labs Reviewed  BASIC METABOLIC PANEL WITH GFR - Abnormal; Notable for the following components:      Result Value   Glucose, Bld 108 (*)    BUN <5 (*)    Calcium 8.8 (*)    All other components within normal limits  CBC - Abnormal; Notable for the following  components:   Hemoglobin 9.8 (*)    HCT 32.6 (*)    MCH 24.1 (*)    RDW 16.2 (*)    Platelets 465 (*)    All other components within normal limits  I-STAT CHEM 8, ED - Abnormal; Notable for the following components:   BUN 5 (*)    Glucose, Bld 103 (*)    Calcium, Ion 1.13 (*)    Hemoglobin 10.9 (*)    HCT 32.0 (*)    All other components within normal limits  HCG, SERUM, QUALITATIVE  TROPONIN I (HIGH SENSITIVITY)  TROPONIN I (HIGH SENSITIVITY)    CT Angio Chest PE W/Cm &/Or Wo Cm  Final Result    DG Chest 2 View  Final Result    LE VENOUS    (Results Pending)    Medications  iohexol  (OMNIPAQUE ) 350 MG/ML injection 75 mL (75 mLs Intravenous Contrast Given 07/27/24 2202)  ipratropium-albuterol (DUONEB) 0.5-2.5 (3) MG/3ML nebulizer solution 3 mL (3 mLs Nebulization Given 07/28/24 0007)     Procedures  /  Critical Care Procedures  ED Course and Medical Decision Making  I have reviewed the triage vital signs, the nursing notes, and pertinent available records from the EMR.  Social Determinants Affecting Complexity  of Care: Patient has no clinically significant social determinants affecting this chief complaint..   ED Course:    Medical Decision Making Patient sent from outpatient facility with elevated D-dimer and reported chest pain and shortness of breath that has been ongoing for about a week.  She states that she has had some dry cough.  Chest x-ray in triage shows some mild central airways thickening.  PE study is negative for PE.  Labs are fairly reassuring, no significant leukocytosis, no marked electrolyte abnormality.  Patient states that she does have significant history of acid reflux.  She takes omeprazole for this.  Will consider switching her to a different PPI.  Will also try giving her a breathing treatment.  She also complains of some right shoulder pain that radiates down into her arm and seems to be consistent with a radiculopathy.  I have advised that  she follow-up with outpatient orthopedics or with her PCP.  Will give her some prednisone for this, which may help with any potential airway inflammation.  She is not febrile.  She denies cold or flu symptoms.  Doubt COVID.  Given the elevated D-dimer, will send her for outpatient DVT studies, which cannot be done tonight due to the hour.  Given negative PE study, will hold off on VTE treatment since she can get the US  in the morning.  Amount and/or Complexity of Data Reviewed Labs: ordered. Radiology: ordered.  Risk Prescription drug management.         Consultants: No consultations were needed in caring for this patient.   Treatment and Plan: I considered admission due to patient's initial presentation, but after considering the examination and diagnostic results, patient will not require admission and can be discharged with outpatient follow-up.    Final Clinical Impressions(s) / ED Diagnoses     ICD-10-CM   1. Shortness of breath  R06.02     2. Acute pain of right shoulder  M25.511       ED Discharge Orders          Ordered    LE VENOUS        07/28/24 0149    pantoprazole (PROTONIX) 20 MG tablet  Daily        07/28/24 0151    predniSONE (DELTASONE) 20 MG tablet  Daily        07/28/24 0151    albuterol (VENTOLIN HFA) 108 (90 Base) MCG/ACT inhaler  Every 4 hours PRN        07/28/24 0151              Discharge Instructions Discussed with and Provided to Patient:     Discharge Instructions      No certain cause of your symptoms was found tonight.  Your CT scan showed no evidence of blood clot.  We will change your acid reflux medication.  I have given you a steroid for your shoulder pain and for potential inflammation of the airways.  Please follow-up with your regular doctor.  Please also follow-up for ultrasound of your legs due to your elevated D-dimer.       Vicky Charleston, PA-C 07/28/24 0159    Carita Senior, MD 07/28/24 [phone]

## 2024-07-27 NOTE — Telephone Encounter (Signed)
 Patient LVM on nurse line requesting a returned call.   No other information provided regarding nature of call.   Called patient back, she did not answer, LVM asking that she return call to office.   Chiquita JAYSON English, RN

## 2024-07-28 ENCOUNTER — Other Ambulatory Visit (HOSPITAL_COMMUNITY): Payer: Self-pay

## 2024-07-28 ENCOUNTER — Emergency Department (HOSPITAL_COMMUNITY)

## 2024-07-28 LAB — HCG, SERUM, QUALITATIVE: Preg, Serum: NEGATIVE

## 2024-07-28 MED ORDER — PREDNISONE 20 MG PO TABS
40.0000 mg | ORAL_TABLET | Freq: Every day | ORAL | 0 refills | Status: AC
  Filled 2024-07-28 (×3): qty 10, 5d supply, fill #0

## 2024-07-28 MED ORDER — ALBUTEROL SULFATE HFA 108 (90 BASE) MCG/ACT IN AERS
2.0000 | INHALATION_SPRAY | RESPIRATORY_TRACT | 3 refills | Status: AC | PRN
  Filled 2024-07-28 (×2): qty 18, 17d supply, fill #0
  Filled 2024-07-28: qty 1, fill #0

## 2024-07-28 MED ORDER — PANTOPRAZOLE SODIUM 20 MG PO TBEC
20.0000 mg | DELAYED_RELEASE_TABLET | Freq: Every day | ORAL | 0 refills | Status: AC
  Filled 2024-07-28 (×3): qty 30, 30d supply, fill #0

## 2024-07-28 NOTE — Discharge Instructions (Signed)
 No certain cause of your symptoms was found tonight.  Your CT scan showed no evidence of blood clot.  We will change your acid reflux medication.  I have given you a steroid for your shoulder pain and for potential inflammation of the airways.  Please follow-up with your regular doctor.  Please also follow-up for ultrasound of your legs due to your elevated D-dimer.

## 2024-07-30 ENCOUNTER — Other Ambulatory Visit: Payer: Self-pay

## 2024-07-30 ENCOUNTER — Ambulatory Visit

## 2024-07-30 ENCOUNTER — Encounter (HOSPITAL_COMMUNITY): Payer: Self-pay | Admitting: Emergency Medicine

## 2024-07-30 ENCOUNTER — Emergency Department (HOSPITAL_COMMUNITY)

## 2024-07-30 ENCOUNTER — Emergency Department (HOSPITAL_COMMUNITY)
Admission: EM | Admit: 2024-07-30 | Discharge: 2024-07-30 | Disposition: A | Discharge status: Home / Self Care | Attending: Emergency Medicine | Admitting: Emergency Medicine

## 2024-07-30 DIAGNOSIS — M7989 Other specified soft tissue disorders: Secondary | ICD-10-CM | POA: Diagnosis not present

## 2024-07-30 DIAGNOSIS — M79604 Pain in right leg: Secondary | ICD-10-CM | POA: Insufficient documentation

## 2024-07-30 DIAGNOSIS — M79661 Pain in right lower leg: Secondary | ICD-10-CM | POA: Diagnosis not present

## 2024-07-30 DIAGNOSIS — M79605 Pain in left leg: Secondary | ICD-10-CM | POA: Insufficient documentation

## 2024-07-30 DIAGNOSIS — M79662 Pain in left lower leg: Secondary | ICD-10-CM | POA: Diagnosis not present

## 2024-07-30 NOTE — Progress Notes (Deleted)
   SUBJECTIVE:   CHIEF COMPLAINT / HPI:  [name] [name] is a 33 y.o. female with a pertinent past medical history of *** presenting to the clinic for ***.  Patient seen at [facility] on 10/2 for chest pain and shortness of breath at which time workup was ordered including D-dimer which was found to be elevated and patient was instructed to present to the ED on 10/3. CXR was with mild central airway thickening but otherwise unremarkable, PE study negative for PE.  Patient was referred for outpatient DVT studies but could not be obtained at the ED due to the time of day. Patient appears to have missed DVT study on 10/4.  She now returns for follow-up.   PERTINENT PMH / PSH: ***  *Remainder reviewed in problem list.   OBJECTIVE:   LMP 07/11/2024  Physical Exam General: Age-appropriate, resting comfortably in chair, NAD, alert and at baseline. HEENT:  Head: Normocephalic, atraumatic. No tenderness to percussion over sinuses. Eyes: PERRLA. No conjunctival erythema or scleral injections. Ears: TMs non-bulging and non-erythematous bilaterally. No erythema of external ear canal. No cerumen impaction. Nose: Non-erythematous turbinates. No rhinorrhea. Mouth/Oral: Clear, no tonsillar exudate. MMM. Neck: Supple. No LAD. Cardiovascular: Regular rate and rhythm. Normal S1/S2. No murmurs, rubs, or gallops appreciated. 2+ radial pulses. Pulmonary: Clear bilaterally to ascultation. No wheezes, crackles, or rhonchi. Normal WOB on room air. No accessory muscle use. Abdominal: No tenderness to deep or light palpation. No rebound or guarding. No HSM. Skin: Warm and dry. Extremities: No peripheral edema bilaterally. Capillary refill <2 seconds.  No results found for this or any previous visit (from the past 48 hours).     07/26/2024    2:02 PM  Depression screen PHQ 2/9  Decreased Interest 0  Down, Depressed, Hopeless 0  PHQ - 2 Score 0  Altered sleeping 0  Tired, decreased energy 0  Change in  appetite 0  Feeling bad or failure about yourself  0  Trouble concentrating 0  Moving slowly or fidgety/restless 0  Suicidal thoughts 0  PHQ-9 Score 0     ASSESSMENT/PLAN:   Assessment & Plan   No follow-ups on file.  Nahomi Hegner Toma, MD [facility]

## 2024-07-30 NOTE — ED Triage Notes (Signed)
 PT seen here on 10/3 for elevated D-dimer. PE ruled out.  States she was told to return here for DVT study.  PT endorses 6/10 pain in right lower leg when ambulating. Pt does not endorse any SOB at this time.

## 2024-07-30 NOTE — Discharge Instructions (Addendum)
 Return if any problems.

## 2024-07-30 NOTE — Progress Notes (Signed)
 BLE venous duplex has been completed.  Preliminary results given to [facility], PA-C.   Results can be found under chart review under CV PROC. 07/30/2024 12:13 PM Derrian Rodak RVT, RDMS

## 2024-07-31 ENCOUNTER — Other Ambulatory Visit: Payer: Self-pay

## 2024-07-31 ENCOUNTER — Encounter: Payer: Self-pay | Admitting: Pharmacist

## 2024-07-31 ENCOUNTER — Other Ambulatory Visit (HOSPITAL_COMMUNITY): Payer: Self-pay

## 2024-07-31 NOTE — ED Provider Notes (Signed)
 [place] EMERGENCY DEPARTMENT AT [facility] Provider Note   CSN: [id number] Arrival date & time: 07/30/24  1008     Patient presents with: No chief complaint on file.   [name] [name] is a 33 y.o. female.   Patient complains of swelling in her right leg.  Patient states that she was seen over the weekend and had a CT scan to evaluate for pulmonary embolus.  Patient reports that she has redness and swelling to her right leg.  Patient states she was advised by her primary care physician to come to the emergency department for vascular ultrasounds.  The history is provided by the patient. No language interpreter was used.       Prior to Admission medications   Medication Sig Start Date End Date Taking? Authorizing Provider  albuterol (VENTOLIN HFA) 108 (90 Base) MCG/ACT inhaler Inhale 2 puffs into the lungs every 4 (four) hours as needed for wheezing or shortness of breath. 07/28/24   Vicky Charleston, PA-C  carbamide peroxide (FT EARWAX REMOVAL) 6.5 % OTIC solution Place 5 drops into both ears 2 (two) times daily. 07/26/24   Nicholas Bar, MD  celecoxib  (CELEBREX ) 200 MG capsule Take 1 capsule (200 mg total) by mouth 2 (two) times daily as needed. 04/12/24   Silver Fell A, PA  pantoprazole (PROTONIX) 20 MG tablet Take 1 tablet (20 mg total) by mouth daily. 07/28/24   Vicky Charleston, PA-C  predniSONE (DELTASONE) 20 MG tablet Take 2 tablets (40 mg total) by mouth daily. 07/28/24   Vicky Charleston, PA-C    Allergies: Patient has no known allergies.    Review of Systems  All other systems reviewed and are negative.   Updated Vital Signs BP 112/71 (BP Location: Left Wrist)   Pulse 73   Temp 98.3 F (36.8 C) (Oral)   Resp 17   LMP 07/11/2024   SpO2 100%   Physical Exam Vitals and nursing note reviewed.  Constitutional:      Appearance: She is well-developed.  HENT:     Head: Normocephalic.  Cardiovascular:     Rate and Rhythm: Normal rate.  Pulmonary:      Effort: Pulmonary effort is normal.  Abdominal:     General: Abdomen is flat. There is no distension.  Musculoskeletal:        General: Swelling and tenderness present.     Cervical back: Normal range of motion.  Skin:    General: Skin is warm.  Neurological:     General: No focal deficit present.     Mental Status: She is alert and oriented to person, place, and time.     (all labs ordered are listed, but only abnormal results are displayed) Labs Reviewed - No data to display  EKG: None  Radiology: VAS US  LOWER EXTREMITY VENOUS (DVT) (ONLY [facility] & [facility]) Result Date: 07/30/2024  Lower Venous DVT Study Patient Name:  [name] [name]  Date of Exam:   07/30/2024 Medical Rec #: [id number]    Accession #:    [id number] Date of Birth: 09/26/1991    Patient Gender: F Patient Age:   4 years Exam Location:  [facility] Procedure:      VAS US  LOWER EXTREMITY VENOUS (DVT) Referring Phys: Siddhartha Hoback --------------------------------------------------------------------------------  Indications: Elevated D-dimer from 07/26/2024 (0.65) & RLE pain when ambulating.  Comparison Study: No previous exams Performing Technologist: Jody Hill RVT, RDMS  Examination Guidelines: A complete evaluation includes B-mode imaging, spectral Doppler, color Doppler, and power Doppler as  needed of all accessible portions of each vessel. Bilateral testing is considered an integral part of a complete examination. Limited examinations for reoccurring indications may be performed as noted. The reflux portion of the exam is performed with the patient in reverse Trendelenburg.  +---------+---------------+---------+-----------+----------+--------------+ RIGHT    CompressibilityPhasicitySpontaneityPropertiesThrombus Aging +---------+---------------+---------+-----------+----------+--------------+ CFV      Full           Yes      Yes                                  +---------+---------------+---------+-----------+----------+--------------+ SFJ      Full                                                        +---------+---------------+---------+-----------+----------+--------------+ FV Prox  Full           Yes      Yes                                 +---------+---------------+---------+-----------+----------+--------------+ FV Mid   Full           Yes      Yes                                 +---------+---------------+---------+-----------+----------+--------------+ FV DistalFull           Yes      Yes                                 +---------+---------------+---------+-----------+----------+--------------+ PFV      Full                                                        +---------+---------------+---------+-----------+----------+--------------+ POP      Full           Yes      Yes                                 +---------+---------------+---------+-----------+----------+--------------+ PTV      Full                                                        +---------+---------------+---------+-----------+----------+--------------+ PERO     Full                                                        +---------+---------------+---------+-----------+----------+--------------+   +---------+---------------+---------+-----------+----------+--------------+ LEFT     CompressibilityPhasicitySpontaneityPropertiesThrombus Aging +---------+---------------+---------+-----------+----------+--------------+ CFV      Full  Yes      Yes                                 +---------+---------------+---------+-----------+----------+--------------+ SFJ      Full                                                        +---------+---------------+---------+-----------+----------+--------------+ FV Prox  Full           Yes      Yes                                  +---------+---------------+---------+-----------+----------+--------------+ FV Mid   Full           Yes      Yes                                 +---------+---------------+---------+-----------+----------+--------------+ FV DistalFull           Yes      Yes                                 +---------+---------------+---------+-----------+----------+--------------+ PFV      Full                                                        +---------+---------------+---------+-----------+----------+--------------+ POP      Full           Yes      Yes                                 +---------+---------------+---------+-----------+----------+--------------+ PTV      Full                                                        +---------+---------------+---------+-----------+----------+--------------+ PERO     Full                                                        +---------+---------------+---------+-----------+----------+--------------+     Summary: BILATERAL: - No evidence of deep vein thrombosis seen in the lower extremities, bilaterally. -No evidence of popliteal cyst, bilaterally.   *See table(s) above for measurements and observations. Electronically signed by Fonda Rim on 07/30/2024 at 3:21:43 PM.    Final      Procedures   Medications Ordered in the ED - No data to display  Medical Decision Making Patient complains of swelling to her  right leg.  Patient was advised by primary care to come to the emergency department for evaluation  Amount and/or Complexity of Data Reviewed External Data Reviewed: notes.    Details: Patient was seen over the weekend and had a CT scan. Radiology: ordered and independent interpretation performed. Decision-making details documented in ED Course.    Details: Ultrasound bilateral extremity shows no evidence of DVT.  Risk Risk Details: Patient counseled on results.  Patient has no evidence  of DVT.  Patient is advised to follow-up with her primary care physician for recheck.        Final diagnoses:  Leg pain, bilateral    ED Discharge Orders     None       An After Visit Summary was printed and given to the patient.    Flint Sonny POUR, PA-C 07/31/24 1318    Emil Share, DO 07/31/24 1353

## 2024-08-14 ENCOUNTER — Other Ambulatory Visit: Payer: Self-pay

## 2024-08-14 ENCOUNTER — Ambulatory Visit (INDEPENDENT_AMBULATORY_CARE_PROVIDER_SITE_OTHER): Admitting: Family Medicine

## 2024-08-14 ENCOUNTER — Ambulatory Visit: Payer: Self-pay | Admitting: Family Medicine

## 2024-08-14 ENCOUNTER — Other Ambulatory Visit (HOSPITAL_COMMUNITY)
Admission: RE | Admit: 2024-08-14 | Discharge: 2024-08-14 | Disposition: A | Discharge status: Home / Self Care | Source: Ambulatory Visit | Attending: Family Medicine | Admitting: Family Medicine

## 2024-08-14 ENCOUNTER — Ambulatory Visit (HOSPITAL_COMMUNITY)
Admission: RE | Admit: 2024-08-14 | Discharge: 2024-08-14 | Disposition: A | Discharge status: Home / Self Care | Source: Ambulatory Visit | Attending: Family Medicine | Admitting: Family Medicine

## 2024-08-14 VITALS — BP 109/65 | HR 71 | Ht 65.0 in | Wt 255.2 lb

## 2024-08-14 DIAGNOSIS — D649 Anemia, unspecified: Secondary | ICD-10-CM

## 2024-08-14 DIAGNOSIS — R102 Pelvic and perineal pain unspecified side: Secondary | ICD-10-CM

## 2024-08-14 DIAGNOSIS — R079 Chest pain, unspecified: Secondary | ICD-10-CM | POA: Diagnosis not present

## 2024-08-14 DIAGNOSIS — R9389 Abnormal findings on diagnostic imaging of other specified body structures: Secondary | ICD-10-CM

## 2024-08-14 DIAGNOSIS — M792 Neuralgia and neuritis, unspecified: Secondary | ICD-10-CM | POA: Diagnosis not present

## 2024-08-14 LAB — POCT URINALYSIS DIP (MANUAL ENTRY)
Bilirubin, UA: NEGATIVE
Blood, UA: NEGATIVE
Glucose, UA: NEGATIVE mg/dL
Ketones, POC UA: NEGATIVE mg/dL
Leukocytes, UA: NEGATIVE
Nitrite, UA: NEGATIVE
Protein Ur, POC: NEGATIVE mg/dL
Spec Grav, UA: >=1.03 — AB (ref 1.010–1.025)
Urobilinogen, UA: 0.2 U/dL
pH, UA: 5.5 (ref 5.0–8.0)

## 2024-08-14 LAB — POCT URINE PREGNANCY: Preg Test, Ur: NEGATIVE

## 2024-08-14 NOTE — Patient Instructions (Addendum)
 Chest pain - I have ordered an xray of your neck.  Uterus pain - I have ordered an ultrasound. - we did some lab tests today. I will let you know the results.

## 2024-08-14 NOTE — Progress Notes (Signed)
    SUBJECTIVE:   CHIEF COMPLAINT / HPI:   Chest pain Seen in this clinic for CP on 07/26/24, at that time ongoing for 1 week. Found to have elevated d-dimer and sent to ED and fortunately PE study negative for PE. She tells me today that chest pain is intermittent, spreading from right upper chest to left and sometimes down right arm. Makes her feel somewhat short of breath at times. No medicines have helped. Does not wake her up from sleep at night. No racing heart or palpitations. Does have GERD, treated previously with omeprazole but switched to Protonix recently - she cannot tell if this helped at all.  Uterus pain Belly pain that seems to settle low in her stomach. Comes and goes. Tells me periods are monthly, but the says FDLMP was sometime in beginning of September. Does have heavy, painful menses. Not sexually active in approx 1 year, not on rx BC. Has some nonspecific vaginal itching; denies odor, discharge.  PERTINENT  PMH / PSH: Reviewed.  OBJECTIVE:   BP 109/65   Pulse 71   Ht 5' 5 (1.651 m)   Wt 255 lb 3.2 oz (115.8 kg)   LMP 07/11/2024   SpO2 99%   BMI 42.47 kg/m   General: well-appearing, no acute distress. HEENT: normocephalic, PERRLA, EOM grossly intact, MMM Cardio: Regular rate, regular rhythm, no murmurs on exam. Pulm: Clear, no wheezing, no crackles. No increased work of breathing. Abdominal: TTP diffusely, but most noticeable in midline abdomen. bowel sounds present, soft, non-distended. No HSM. Extremities: no peripheral edema. Moves all extremities equally. Neuro: Alert and oriented x3, speech normal in content, no facial asymmetry  GU Exam:  External exam: Normal-appearing female external genitalia.   Vaginal exam notable for normal vaginal ruggae.  Cervix without discharge or obvious lesion.  Bimanual exam reveals normal sized uterus no masses appreciated, no pain with examination.  Chaperoned exam, CMA Dayshia.    ASSESSMENT/PLAN:    Assessment & Plan Chest pain in adult EKG today with sinus bradycardia, no other abnormality; specifically no ST changes. Chest pain is reproducible over right chest and sternum - I strongly suspect this is MSK rather than cardiac in origin. Given her right arm pain as well I suspect cervical radiculopathy. Plan as below. Radicular pain in right arm - c-spine plain films ordered - referral placed to orthopedics Pelvic pain Difficult to assess given nonspecific symptoms from patient. Pelvic exam today not suspicious for PID. Description of vaginal itching is suspicious for yeast vs BV. - STI testing - Pelvic US  ordered - urinalysis with urine pregnancy test - HIV, RPR Low hemoglobin Seen on CBC earlier this month; given heavy menses will also check iron. - iron panel today    Lauraine Norse, DO [facility]

## 2024-08-15 LAB — CERVICOVAGINAL ANCILLARY ONLY
Bacterial Vaginitis (gardnerella): NEGATIVE
Candida Glabrata: NEGATIVE
Candida Vaginitis: NEGATIVE
Chlamydia: NEGATIVE
Comment: NEGATIVE
Comment: NEGATIVE
Comment: NEGATIVE
Comment: NEGATIVE
Comment: NEGATIVE
Comment: NORMAL
Neisseria Gonorrhea: NEGATIVE
Trichomonas: NEGATIVE

## 2024-08-15 LAB — IRON,TIBC AND FERRITIN PANEL
Ferritin: 5 ng/mL — ABNORMAL LOW (ref 15–150)
Iron Saturation: 7 % — CL (ref 15–55)
Iron: 30 ug/dL (ref 27–159)
Total Iron Binding Capacity: 416 ug/dL (ref 250–450)
UIBC: 386 ug/dL (ref 131–425)

## 2024-08-15 LAB — HIV ANTIBODY (ROUTINE TESTING W REFLEX): HIV Screen 4th Generation wRfx: NONREACTIVE

## 2024-08-15 LAB — RPR: RPR Ser Ql: NONREACTIVE

## 2024-08-16 ENCOUNTER — Other Ambulatory Visit: Payer: Self-pay | Admitting: Family Medicine

## 2024-08-16 ENCOUNTER — Other Ambulatory Visit (HOSPITAL_COMMUNITY): Payer: Self-pay

## 2024-08-16 DIAGNOSIS — D509 Iron deficiency anemia, unspecified: Secondary | ICD-10-CM | POA: Insufficient documentation

## 2024-08-16 MED ORDER — FERROUS SULFATE 325 (65 FE) MG PO TABS
325.0000 mg | ORAL_TABLET | ORAL | 3 refills | Status: AC
  Filled 2024-08-16: qty 45, 90d supply, fill #0

## 2024-08-21 ENCOUNTER — Ambulatory Visit: Admitting: Physician Assistant

## 2024-08-22 ENCOUNTER — Ambulatory Visit (HOSPITAL_COMMUNITY)
Admission: RE | Admit: 2024-08-22 | Discharge: 2024-08-22 | Disposition: A | Discharge status: Home / Self Care | Source: Ambulatory Visit | Attending: Family Medicine | Admitting: Family Medicine

## 2024-08-22 DIAGNOSIS — R102 Pelvic and perineal pain unspecified side: Secondary | ICD-10-CM | POA: Insufficient documentation

## 2024-08-24 NOTE — Telephone Encounter (Signed)
 Spoke with patient guarding recent transvaginal ultrasound results which showed slightly thickened endometrium.  Recommendation is for repeat transvaginal ultrasound after next menses. Patient will call us  at the start of her next menses so that we can order and schedule repeat transvaginal ultrasound. All questions answered, patient is agreeable to this plan.

## 2024-08-27 ENCOUNTER — Encounter: Payer: Self-pay | Admitting: Radiology

## 2024-08-28 ENCOUNTER — Ambulatory Visit: Admitting: Physician Assistant

## 2024-08-28 ENCOUNTER — Other Ambulatory Visit (HOSPITAL_COMMUNITY): Payer: Self-pay

## 2024-08-31 ENCOUNTER — Other Ambulatory Visit (INDEPENDENT_AMBULATORY_CARE_PROVIDER_SITE_OTHER): Payer: Self-pay

## 2024-08-31 ENCOUNTER — Encounter: Payer: Self-pay | Admitting: Physician Assistant

## 2024-08-31 ENCOUNTER — Other Ambulatory Visit: Payer: Self-pay | Admitting: Family Medicine

## 2024-08-31 ENCOUNTER — Ambulatory Visit: Admitting: Physician Assistant

## 2024-08-31 DIAGNOSIS — M542 Cervicalgia: Secondary | ICD-10-CM

## 2024-08-31 NOTE — Telephone Encounter (Signed)
 Patient calls nurse line regarding previous message.   She reports that she started her period today.   She prefers AM appointments for follow up ultrasound.   What time frame would you like patient to be scheduled for?  Chiquita JAYSON English, RN

## 2024-08-31 NOTE — Progress Notes (Signed)
 Office Visit Note   Patient: [name] [name]           Date of Birth: 11-01-1990           MRN: [id number] Visit Date: 08/31/2024              Requested by: Madelon Donald HERO, DO [address] [place],  [place] [address] PCP: Howell Lunger, DO   Assessment & Plan: Visit Diagnoses:  1. Neck pain     Plan: Patient is actually neurologic intact she has a little pain with shoulder motion however nothing significant.  Essentially negative exam today.  I told her if the pain returns to go on a week of ibuprofen .  If she has worse symptoms to follow-up  Follow-Up Instructions: No follow-ups on file.   Orders:  Orders Placed This Encounter  Procedures   XR Cervical Spine 2 or 3 views   No orders of the defined types were placed in this encounter.     Procedures: No procedures performed   Clinical Data: No additional findings.   Subjective: No chief complaint on file.   HPI patient is a pleasant 33 year old woman comes in today with a 1 month history of neck pain.  This began about a month ago she describes pain running down her arm with some paresthesias in her hand.  She does not have any particular injury.  She actually says its gotten much better  Review of Systems  All other systems reviewed and are negative.    Objective: Vital Signs: There were no vitals taken for this visit.  Physical Exam Constitutional:      Appearance: Normal appearance.  Pulmonary:     Effort: Pulmonary effort is normal.  Skin:    General: Skin is warm and dry.  Neurological:     General: No focal deficit present.     Mental Status: She is alert and oriented to person, place, and time.  Psychiatric:        Mood and Affect: Mood normal.        Behavior: Behavior normal.     Ortho Exam Examination she has no pain with flexion extension of her neck turning side-to-side motion is good she has good forward elevation of her arm internal rotation behind her back she has excellent  strength with grip strength resisted abduction external/internal rotation Specialty Comments:  No specialty comments available.  Imaging: No results found.   PMFS History: Patient Active Problem List   Diagnosis Date Noted   Iron deficiency anemia 08/16/2024   Dysmenorrhea 04/28/2021   HSIL (high grade squamous intraepithelial lesion) on Pap smear of cervix 04/28/2021   Learning disability 08/20/2013   Past Medical History:  Diagnosis Date   Acid reflux    Anxiety    hx of anxiety attack   Gonorrhea     Family History  Problem Relation Age of Onset   Asthma Sister    Hypertension Brother    Asthma Brother    Diabetes Maternal Grandmother    Diabetes Maternal Grandfather    Other Neg Hx     Past Surgical History:  Procedure Laterality Date   NO PAST SURGERIES     WISDOM TOOTH EXTRACTION     Social History   Occupational History   Not on file  Tobacco Use   Smoking status: Never   Smokeless tobacco: Never  Vaping Use   Vaping status: Never Used  Substance and Sexual Activity   Alcohol  use: No  Drug use: No   Sexual activity: Yes    Birth control/protection: None

## 2024-09-01 ENCOUNTER — Other Ambulatory Visit: Payer: Self-pay

## 2024-09-01 ENCOUNTER — Emergency Department (HOSPITAL_COMMUNITY)
Admission: EM | Admit: 2024-09-01 | Discharge: 2024-09-01 | Disposition: A | Discharge status: Home / Self Care | Attending: Emergency Medicine | Admitting: Emergency Medicine

## 2024-09-01 ENCOUNTER — Encounter (HOSPITAL_COMMUNITY): Payer: Self-pay

## 2024-09-01 DIAGNOSIS — N3001 Acute cystitis with hematuria: Secondary | ICD-10-CM | POA: Insufficient documentation

## 2024-09-01 DIAGNOSIS — Z743 Need for continuous supervision: Secondary | ICD-10-CM | POA: Diagnosis not present

## 2024-09-01 DIAGNOSIS — R1084 Generalized abdominal pain: Secondary | ICD-10-CM | POA: Diagnosis not present

## 2024-09-01 DIAGNOSIS — N946 Dysmenorrhea, unspecified: Secondary | ICD-10-CM | POA: Diagnosis not present

## 2024-09-01 DIAGNOSIS — R1032 Left lower quadrant pain: Secondary | ICD-10-CM | POA: Diagnosis present

## 2024-09-01 LAB — COMPREHENSIVE METABOLIC PANEL WITH GFR
ALT: 14 U/L (ref 0–44)
AST: 22 U/L (ref 15–41)
Albumin: 3.8 g/dL (ref 3.5–5.0)
Alkaline Phosphatase: 94 U/L (ref 38–126)
Anion gap: 9 (ref 5–15)
BUN: 5 mg/dL — ABNORMAL LOW (ref 6–20)
CO2: 25 mmol/L (ref 22–32)
Calcium: 9.4 mg/dL (ref 8.9–10.3)
Chloride: 105 mmol/L (ref 98–111)
Creatinine, Ser: 0.68 mg/dL (ref 0.44–1.00)
GFR, Estimated: >60 mL/min (ref >60)
Glucose, Bld: 101 mg/dL — ABNORMAL HIGH (ref 70–99)
Potassium: 4.1 mmol/L (ref 3.5–5.1)
Sodium: 138 mmol/L (ref 135–145)
Total Bilirubin: 0.3 mg/dL (ref 0.0–1.2)
Total Protein: 7.6 g/dL (ref 6.5–8.1)

## 2024-09-01 LAB — URINALYSIS, ROUTINE W REFLEX MICROSCOPIC
Bilirubin Urine: NEGATIVE
Glucose, UA: NEGATIVE mg/dL
Ketones, ur: NEGATIVE mg/dL
Nitrite: NEGATIVE
Protein, ur: 100 mg/dL — AB
RBC / HPF: >50 RBC/hpf (ref 0–5)
Specific Gravity, Urine: 1.023 (ref 1.005–1.030)
WBC, UA: >50 WBC/hpf (ref 0–5)
pH: 7 (ref 5.0–8.0)

## 2024-09-01 LAB — CBC
HCT: 31.7 % — ABNORMAL LOW (ref 36.0–46.0)
Hemoglobin: 9.2 g/dL — ABNORMAL LOW (ref 12.0–15.0)
MCH: 23.7 pg — ABNORMAL LOW (ref 26.0–34.0)
MCHC: 29 g/dL — ABNORMAL LOW (ref 30.0–36.0)
MCV: 81.7 fL (ref 80.0–100.0)
Platelets: 495 K/uL — ABNORMAL HIGH (ref 150–400)
RBC: 3.88 MIL/uL (ref 3.87–5.11)
RDW: 15.9 % — ABNORMAL HIGH (ref 11.5–15.5)
WBC: 8.4 K/uL (ref 4.0–10.5)
nRBC: 0 % (ref 0.0–0.2)

## 2024-09-01 LAB — LIPASE, BLOOD: Lipase: 17 U/L (ref 11–51)

## 2024-09-01 LAB — HCG, SERUM, QUALITATIVE: Preg, Serum: NEGATIVE

## 2024-09-01 MED ORDER — KETOROLAC TROMETHAMINE 15 MG/ML IJ SOLN
15.0000 mg | Freq: Once | INTRAMUSCULAR | Status: AC
  Administered 2024-09-01: 15 mg via INTRAVENOUS

## 2024-09-01 MED ORDER — ONDANSETRON 4 MG PO TBDP: 4.0000 mg | ORAL_TABLET | Freq: Three times a day (TID) | ORAL | 0 refills | Status: AC | PRN

## 2024-09-01 MED ORDER — ONDANSETRON HCL 4 MG/2ML IJ SOLN
4.0000 mg | Freq: Once | INTRAMUSCULAR | Status: AC
  Administered 2024-09-01: 4 mg via INTRAVENOUS
  Filled 2024-09-01: qty 2

## 2024-09-01 MED ORDER — MORPHINE SULFATE (PF) 2 MG/ML IV SOLN
2.0000 mg | Freq: Once | INTRAVENOUS | Status: AC
  Administered 2024-09-01: 2 mg via INTRAVENOUS
  Filled 2024-09-01: qty 1

## 2024-09-01 MED ORDER — NAPROXEN 500 MG PO TABS
500.0000 mg | ORAL_TABLET | Freq: Two times a day (BID) | ORAL | 0 refills | Status: DC
  Filled 2024-09-01: qty 30, 15d supply, fill #0

## 2024-09-01 MED ORDER — CEPHALEXIN 500 MG PO CAPS: 500.0000 mg | ORAL_CAPSULE | Freq: Two times a day (BID) | ORAL | 0 refills | Status: AC

## 2024-09-01 MED ORDER — CEPHALEXIN 500 MG PO CAPS
500.0000 mg | ORAL_CAPSULE | Freq: Two times a day (BID) | ORAL | 0 refills | Status: DC
Start: 2024-09-01 — End: 2024-09-01
  Filled 2024-09-01: qty 14, 7d supply, fill #0

## 2024-09-01 MED ORDER — KETOROLAC TROMETHAMINE 15 MG/ML IJ SOLN
15.0000 mg | Freq: Once | INTRAMUSCULAR | Status: DC
  Filled 2024-09-01: qty 1

## 2024-09-01 MED ORDER — CEPHALEXIN 500 MG PO CAPS
500.0000 mg | ORAL_CAPSULE | Freq: Once | ORAL | Status: AC
  Administered 2024-09-01: 500 mg via ORAL
  Filled 2024-09-01: qty 1

## 2024-09-01 MED ORDER — ONDANSETRON 4 MG PO TBDP
4.0000 mg | ORAL_TABLET | Freq: Once | ORAL | Status: DC
  Filled 2024-09-01: qty 1

## 2024-09-01 MED ORDER — NAPROXEN 500 MG PO TABS: 500.0000 mg | ORAL_TABLET | Freq: Two times a day (BID) | ORAL | 0 refills | Status: AC

## 2024-09-01 NOTE — ED Provider Notes (Signed)
 [place] [facility]MENT AT [facility] Provider Note   CSN: [id number] Arrival date & time: 09/01/24  1526     Patient presents with: Abdominal Pain and Nausea   [name] [name] is a 33 y.o. female with PMHx IDA, GERD who presents to ED concerned for LLQ pain since yesterday. Patient stating that she also started her period yesterday and the LLQ pain is consistent with normal menstrual pain. Pain is not more severe than usual. Patient also with headache which is also normal for her menstrual cycles. Patient also nauseated but denies vomiting or diarrhea. Last BM today. Denies hematochezia. Denies dysuria or other urinary complaint. Denies fever, chest pain, cough, or SOB. Denies vaginal discharge or concern for STD. Patient has taken tylenol  for pain which did not provide much relief.     Abdominal Pain      Prior to Admission medications   Medication Sig Start Date End Date Taking? Authorizing Provider  albuterol (VENTOLIN HFA) 108 (90 Base) MCG/ACT inhaler Inhale 2 puffs into the lungs every 4 (four) hours as needed for wheezing or shortness of breath. 07/28/24   Vicky Charleston, PA-C  carbamide peroxide (FT EARWAX REMOVAL) 6.5 % OTIC solution Place 5 drops into both ears 2 (two) times daily. 07/26/24   Nicholas Bar, MD  celecoxib  (CELEBREX ) 200 MG capsule Take 1 capsule (200 mg total) by mouth 2 (two) times daily as needed. 04/12/24   Silver Wonda LABOR, PA  cephALEXin  (KEFLEX ) 500 MG capsule Take 1 capsule (500 mg total) by mouth 2 (two) times daily for 7 days. 09/01/24 09/08/24  Hoy Nidia FALCON, PA-C  ferrous sulfate  325 (65 FE) MG tablet Take 1 tablet (325 mg total) by mouth every other day. 08/16/24   Lafe Domino, DO  naproxen  (NAPROSYN ) 500 MG tablet Take 1 tablet (500 mg total) by mouth 2 (two) times daily. 09/01/24   Hoy Nidia FALCON, PA-C  pantoprazole (PROTONIX) 20 MG tablet Take 1 tablet (20 mg total) by mouth daily. 07/28/24   Vicky Charleston, PA-C   predniSONE (DELTASONE) 20 MG tablet Take 2 tablets (40 mg total) by mouth daily. 07/28/24   Vicky Charleston, PA-C    Allergies: Patient has no known allergies.    Review of Systems  Gastrointestinal:  Positive for abdominal pain.    Updated Vital Signs BP 127/84   Pulse 63   Temp 98.2 F (36.8 C)   Resp 17   LMP 08/31/2024   SpO2 100%   Physical Exam Vitals and nursing note reviewed.  Constitutional:      General: She is not in acute distress.    Appearance: She is not ill-appearing or toxic-appearing.  HENT:     Head: Normocephalic and atraumatic.     Mouth/Throat:     Mouth: Mucous membranes are moist.     Pharynx: No posterior oropharyngeal erythema.  Eyes:     General: No scleral icterus.       Right eye: No discharge.        Left eye: No discharge.     Conjunctiva/sclera: Conjunctivae normal.  Cardiovascular:     Rate and Rhythm: Normal rate and regular rhythm.     Pulses: Normal pulses.     Heart sounds: Normal heart sounds. No murmur heard. Pulmonary:     Effort: Pulmonary effort is normal. No respiratory distress.     Breath sounds: Normal breath sounds. No wheezing, rhonchi or rales.  Abdominal:     General: Abdomen is flat. Bowel sounds  are normal. There is no distension.     Palpations: Abdomen is soft. There is no mass.     Tenderness: There is abdominal tenderness in the left lower quadrant.  Musculoskeletal:     Right lower leg: No edema.     Left lower leg: No edema.  Skin:    General: Skin is warm and dry.     Findings: No rash.  Neurological:     General: No focal deficit present.     Mental Status: She is alert and oriented to person, place, and time. Mental status is at baseline.  Psychiatric:        Mood and Affect: Mood normal.        Behavior: Behavior normal.     (all labs ordered are listed, but only abnormal results are displayed) Labs Reviewed  COMPREHENSIVE METABOLIC PANEL WITH GFR - Abnormal; Notable for the following  components:      Result Value   Glucose, Bld 101 (*)    BUN 5 (*)    All other components within normal limits  CBC - Abnormal; Notable for the following components:   Hemoglobin 9.2 (*)    HCT 31.7 (*)    MCH 23.7 (*)    MCHC 29.0 (*)    RDW 15.9 (*)    Platelets 495 (*)    All other components within normal limits  URINALYSIS, ROUTINE W REFLEX MICROSCOPIC - Abnormal; Notable for the following components:   Color, Urine RED (*)    APPearance CLOUDY (*)    Hgb urine dipstick LARGE (*)    Protein, ur 100 (*)    Leukocytes,Ua TRACE (*)    Bacteria, UA RARE (*)    All other components within normal limits  LIPASE, BLOOD  HCG, SERUM, QUALITATIVE    EKG: None  Radiology: XR Cervical Spine 2 or 3 views Result Date: 08/31/2024 No acute osseous injury she does have spasm consistent with loss of normal lordotic curve    Procedures   Medications Ordered in the ED  cephALEXin  (KEFLEX ) capsule 500 mg (has no administration in time range)  ketorolac  (TORADOL ) 15 MG/ML injection 15 mg (15 mg Intravenous Given 09/01/24 1642)  ondansetron  (ZOFRAN ) injection 4 mg (4 mg Intravenous Given 09/01/24 1646)  morphine (PF) 2 MG/ML injection 2 mg (2 mg Intravenous Given 09/01/24 1747)                                    Medical Decision Making Amount and/or Complexity of Data Reviewed Labs: ordered.  Risk Prescription drug management.    This patient presents to the ED for concern of abdominal pain, this involves an extensive number of treatment options, and is a complaint that carries with it a high risk of complications and morbidity.  The differential diagnosis includes gastroenteritis, colitis, small bowel obstruction, appendicitis, cholecystitis, pancreatitis, nephrolithiasis, UTI, pyleonephritis, ruptured ectopic pregnancy, PID, ovarian torsion.   Co morbidities that complicate the patient evaluation  IDA, GERD    Additional history obtained:  Patient with transvaginal US   scheduled for 09/10/2024   Problem List / ED Course / Critical interventions / Medication management  Patient presented for abdominal pain and nausea.  Patient stating that these exact symptoms are consistent with each monthly menstrual cycle since she was 33 years old.  This month's menstrual cycle started yesterday. Patient stating that she is here because she is tired of the monthly  pain. Physical exam with LLQ tenderness to palpation.  Rest of physical exam reassuring.  Patient afebrile with stable vitals. I Ordered, and personally interpreted labs.  hCG negative.  CBC without leukocytosis.  There is anemia with hemoglobin near baseline at 9.2 today.  CMP reassuring.  Lipase within normal limits. UA with trace leukocytes, rare bacteria, and >50 WBC. Shared decision making with patient who would prefer to start ABX therapy today and follow up with PCP. Offered patient pelvic ultrasound.  Patient declining stating that symptoms are consistent with all other monthly menstrual cycles since she was 33 years old.  I feel that this is appropriate. Patient is currently undergoing workup for this with PCP and has a transvaginal US  scheduled for 11/17. Patient stating that she has not met with OBGYN in many years so I will also give her their information to follow up with. Provided patient with 2 mg morphine, Zofran , Toradol .  Patient feeling better and is ready to go home.  Will prescribe patient naproxen  and keflex .  Patient agreeable with plan I have reviewed the patients home medicines and have made adjustments as needed The patient has been appropriately medically screened and/or stabilized in the ED. I have low suspicion for any other emergent medical condition which would require further screening, evaluation or treatment in the ED or require inpatient management. At time of discharge the patient is hemodynamically stable and in no acute distress. I have discussed work-up results and diagnosis with  patient and answered all questions. Patient is agreeable with discharge plan. We discussed strict return precautions for returning to the emergency department and they verbalized understanding.     Social Determinants of Health:  none       Final diagnoses:  Dysmenorrhea  Acute cystitis with hematuria    ED Discharge Orders          Ordered    naproxen  (NAPROSYN ) 500 MG tablet  2 times daily,   Status:  Discontinued        09/01/24 1730    cephALEXin  (KEFLEX ) 500 MG capsule  2 times daily,   Status:  Discontinued        09/01/24 1802    cephALEXin  (KEFLEX ) 500 MG capsule  2 times daily        09/01/24 1803    naproxen  (NAPROSYN ) 500 MG tablet  2 times daily        09/01/24 1803               Hoy Nidia FALCON, PA-C 09/01/24 1804    Tegeler, Lonni PARAS, MD 09/01/24 1912

## 2024-09-01 NOTE — Discharge Instructions (Addendum)
 As discussed, you will need to follow-up with primary care and OB/GYN.  You can also take Tylenol  while taking Naproxen  which should help the pain even more. Seek emergency care if experiencing any new or worsening symptoms.

## 2024-09-01 NOTE — ED Triage Notes (Signed)
 PT arrives via EMS from home. Pt c/o lower abdominal pain and nausea. Reports it is from starting her menstrual cycle yesterday. PT arrives [facility].

## 2024-09-02 ENCOUNTER — Other Ambulatory Visit (HOSPITAL_COMMUNITY): Payer: Self-pay

## 2024-09-04 ENCOUNTER — Other Ambulatory Visit (HOSPITAL_COMMUNITY): Payer: Self-pay

## 2024-09-04 ENCOUNTER — Other Ambulatory Visit: Payer: Self-pay

## 2024-09-04 MED ORDER — NAPROXEN 500 MG PO TABS
500.0000 mg | ORAL_TABLET | Freq: Two times a day (BID) | ORAL | 0 refills | Status: AC
  Filled 2024-09-04 – 2024-09-12 (×2): qty 30, 15d supply, fill #0

## 2024-09-04 MED ORDER — CEPHALEXIN 500 MG PO CAPS
500.0000 mg | ORAL_CAPSULE | Freq: Two times a day (BID) | ORAL | 0 refills | Status: AC
  Filled 2024-09-04 – 2024-11-08 (×3): qty 14, 7d supply, fill #0

## 2024-09-05 ENCOUNTER — Other Ambulatory Visit: Payer: Self-pay

## 2024-09-05 ENCOUNTER — Encounter: Payer: Self-pay | Admitting: Pharmacist

## 2024-09-05 ENCOUNTER — Other Ambulatory Visit (HOSPITAL_COMMUNITY): Payer: Self-pay

## 2024-09-05 DIAGNOSIS — Z419 Encounter for procedure for purposes other than remedying health state, unspecified: Secondary | ICD-10-CM | POA: Diagnosis not present

## 2024-09-07 ENCOUNTER — Other Ambulatory Visit (HOSPITAL_COMMUNITY): Payer: Self-pay

## 2024-09-10 ENCOUNTER — Other Ambulatory Visit: Payer: Self-pay

## 2024-09-10 ENCOUNTER — Ambulatory Visit (HOSPITAL_COMMUNITY): Admission: RE | Admit: 2024-09-10 | Source: Ambulatory Visit

## 2024-09-12 ENCOUNTER — Other Ambulatory Visit (HOSPITAL_COMMUNITY): Payer: Self-pay

## 2024-09-12 ENCOUNTER — Other Ambulatory Visit: Payer: Self-pay

## 2024-09-13 ENCOUNTER — Ambulatory Visit (HOSPITAL_COMMUNITY)

## 2024-09-13 ENCOUNTER — Other Ambulatory Visit: Payer: Self-pay

## 2024-09-18 ENCOUNTER — Other Ambulatory Visit: Payer: Self-pay

## 2024-09-26 ENCOUNTER — Ambulatory Visit (HOSPITAL_COMMUNITY)
Admission: RE | Admit: 2024-09-26 | Discharge: 2024-09-26 | Disposition: A | Source: Ambulatory Visit | Attending: Family Medicine

## 2024-09-26 DIAGNOSIS — R9389 Abnormal findings on diagnostic imaging of other specified body structures: Secondary | ICD-10-CM | POA: Insufficient documentation

## 2024-09-26 DIAGNOSIS — R102 Pelvic and perineal pain unspecified side: Secondary | ICD-10-CM | POA: Insufficient documentation

## 2024-10-03 ENCOUNTER — Ambulatory Visit: Payer: Self-pay | Admitting: Family Medicine

## 2024-10-23 ENCOUNTER — Other Ambulatory Visit

## 2024-10-29 ENCOUNTER — Other Ambulatory Visit

## 2024-10-30 ENCOUNTER — Other Ambulatory Visit

## 2024-10-30 DIAGNOSIS — D509 Iron deficiency anemia, unspecified: Secondary | ICD-10-CM

## 2024-10-31 ENCOUNTER — Ambulatory Visit: Payer: Self-pay | Admitting: Family Medicine

## 2024-10-31 ENCOUNTER — Telehealth: Payer: Self-pay

## 2024-10-31 LAB — CBC
Hematocrit: 30.6 % — ABNORMAL LOW (ref 34.0–46.6)
Hemoglobin: 9 g/dL — ABNORMAL LOW (ref 11.1–15.9)
MCH: 23.5 pg — ABNORMAL LOW (ref 26.6–33.0)
MCHC: 29.4 g/dL — ABNORMAL LOW (ref 31.5–35.7)
MCV: 80 fL (ref 79–97)
Platelets: 581 x10E3/uL — ABNORMAL HIGH (ref 150–450)
RBC: 3.83 x10E6/uL (ref 3.77–5.28)
RDW: 15.8 % — ABNORMAL HIGH (ref 11.7–15.4)
WBC: 5.8 x10E3/uL (ref 3.4–10.8)

## 2024-10-31 LAB — IRON,TIBC AND FERRITIN PANEL
Ferritin: 7 ng/mL — ABNORMAL LOW (ref 15–150)
Iron Saturation: 5 % — CL (ref 15–55)
Iron: 23 ug/dL — ABNORMAL LOW (ref 27–159)
Total Iron Binding Capacity: 434 ug/dL (ref 250–450)
UIBC: 411 ug/dL (ref 131–425)

## 2024-10-31 NOTE — Telephone Encounter (Signed)
 I did not precept yesterday. Looks like orders were released from prior order 08/16/24 from Dr. Lafe and cosigned to me for release. Adding her to conversation.

## 2024-10-31 NOTE — Telephone Encounter (Signed)
 Patient calls nurse line regarding results from yesterday's visit.   She was able to view these results via mychart and is asking what next steps should be. Advised patient that I would forward to PCP for further review/advisement.   Best contact number for patient is [phone].  Chiquita JAYSON English, RN

## 2024-11-08 ENCOUNTER — Other Ambulatory Visit (HOSPITAL_COMMUNITY): Payer: Self-pay
